# Patient Record
Sex: Male | Born: 1970 | Race: White | Hispanic: No | Marital: Single | Smoking: Never smoker
Health system: Southern US, Community
[De-identification: ages and names within clinical notes are randomized; demographics above are authoritative.]

## PROBLEM LIST (undated history)

## (undated) DIAGNOSIS — J302 Other seasonal allergic rhinitis: Secondary | ICD-10-CM

## (undated) DIAGNOSIS — T7840XA Allergy, unspecified, initial encounter: Secondary | ICD-10-CM

## (undated) DIAGNOSIS — R011 Cardiac murmur, unspecified: Secondary | ICD-10-CM

## (undated) DIAGNOSIS — M1990 Unspecified osteoarthritis, unspecified site: Secondary | ICD-10-CM

## (undated) DIAGNOSIS — Z7251 High risk heterosexual behavior: Secondary | ICD-10-CM

## (undated) DIAGNOSIS — E785 Hyperlipidemia, unspecified: Secondary | ICD-10-CM

## (undated) DIAGNOSIS — R7989 Other specified abnormal findings of blood chemistry: Secondary | ICD-10-CM

## (undated) DIAGNOSIS — E291 Testicular hypofunction: Secondary | ICD-10-CM

## (undated) DIAGNOSIS — D509 Iron deficiency anemia, unspecified: Secondary | ICD-10-CM

## (undated) DIAGNOSIS — I10 Essential (primary) hypertension: Secondary | ICD-10-CM

## (undated) DIAGNOSIS — K219 Gastro-esophageal reflux disease without esophagitis: Secondary | ICD-10-CM

## (undated) DIAGNOSIS — D3502 Benign neoplasm of left adrenal gland: Secondary | ICD-10-CM

## (undated) DIAGNOSIS — E119 Type 2 diabetes mellitus without complications: Secondary | ICD-10-CM

## (undated) DIAGNOSIS — K2960 Other gastritis without bleeding: Secondary | ICD-10-CM

## (undated) DIAGNOSIS — G510 Bell's palsy: Secondary | ICD-10-CM

## (undated) DIAGNOSIS — K222 Esophageal obstruction: Secondary | ICD-10-CM

## (undated) DIAGNOSIS — K7581 Nonalcoholic steatohepatitis (NASH): Secondary | ICD-10-CM

## (undated) HISTORY — DX: Cardiac murmur, unspecified: R01.1

## (undated) HISTORY — DX: Testicular hypofunction: E29.1

## (undated) HISTORY — DX: Unspecified osteoarthritis, unspecified site: M19.90

## (undated) HISTORY — DX: Esophageal obstruction: K22.2

## (undated) HISTORY — DX: High risk heterosexual behavior: Z72.51

## (undated) HISTORY — DX: Other seasonal allergic rhinitis: J30.2

## (undated) HISTORY — PX: TONSILLECTOMY: SUR1361

## (undated) HISTORY — DX: Allergy, unspecified, initial encounter: T78.40XA

## (undated) HISTORY — DX: Gastro-esophageal reflux disease without esophagitis: K21.9

## (undated) HISTORY — DX: Essential (primary) hypertension: I10

## (undated) HISTORY — DX: Benign neoplasm of left adrenal gland: D35.02

## (undated) HISTORY — DX: Other gastritis without bleeding: K29.60

## (undated) HISTORY — DX: Morbid (severe) obesity due to excess calories: E66.01

## (undated) HISTORY — DX: Iron deficiency anemia, unspecified: D50.9

## (undated) HISTORY — DX: Nonalcoholic steatohepatitis (NASH): K75.81

## (undated) HISTORY — DX: Other specified abnormal findings of blood chemistry: R79.89

## (undated) HISTORY — DX: Hyperlipidemia, unspecified: E78.5

---

## 1998-04-13 ENCOUNTER — Ambulatory Visit (HOSPITAL_COMMUNITY): Admission: RE | Admit: 1998-04-13 | Discharge: 1998-04-13 | Payer: Self-pay | Admitting: Internal Medicine

## 1998-09-11 HISTORY — PX: LASIK: SHX215

## 2003-11-19 ENCOUNTER — Encounter: Payer: Self-pay | Admitting: Internal Medicine

## 2004-09-21 ENCOUNTER — Ambulatory Visit: Payer: Self-pay | Admitting: Family Medicine

## 2006-02-07 ENCOUNTER — Ambulatory Visit: Payer: Self-pay | Admitting: Internal Medicine

## 2006-02-28 ENCOUNTER — Ambulatory Visit: Payer: Self-pay | Admitting: Internal Medicine

## 2006-04-19 ENCOUNTER — Encounter: Admission: RE | Admit: 2006-04-19 | Discharge: 2006-04-19 | Payer: Self-pay | Admitting: Specialist

## 2006-04-24 ENCOUNTER — Ambulatory Visit: Payer: Self-pay | Admitting: Internal Medicine

## 2007-07-09 ENCOUNTER — Ambulatory Visit: Payer: Self-pay | Admitting: Internal Medicine

## 2007-10-16 ENCOUNTER — Ambulatory Visit: Payer: Self-pay | Admitting: Internal Medicine

## 2007-10-17 ENCOUNTER — Telehealth (INDEPENDENT_AMBULATORY_CARE_PROVIDER_SITE_OTHER): Payer: Self-pay | Admitting: *Deleted

## 2008-01-29 ENCOUNTER — Ambulatory Visit: Payer: Self-pay | Admitting: Internal Medicine

## 2008-01-30 ENCOUNTER — Encounter (INDEPENDENT_AMBULATORY_CARE_PROVIDER_SITE_OTHER): Payer: Self-pay | Admitting: *Deleted

## 2008-01-30 LAB — CONVERTED CEMR LAB
Basophils Absolute: 0 10*3/uL (ref 0.0–0.1)
Eosinophils Absolute: 0.1 10*3/uL (ref 0.0–0.7)
HCT: 43.3 % (ref 39.0–52.0)
Hemoglobin: 14.4 g/dL (ref 13.0–17.0)
Hgb A1c MFr Bld: 5.5 % (ref 4.6–6.0)
Lymphocytes Relative: 30.2 % (ref 12.0–46.0)
MCHC: 33.3 g/dL (ref 30.0–36.0)
Monocytes Absolute: 0.7 10*3/uL (ref 0.1–1.0)
Neutro Abs: 4.4 10*3/uL (ref 1.4–7.7)
RDW: 13.4 % (ref 11.5–14.6)

## 2008-02-04 ENCOUNTER — Telehealth (INDEPENDENT_AMBULATORY_CARE_PROVIDER_SITE_OTHER): Payer: Self-pay | Admitting: *Deleted

## 2008-09-17 ENCOUNTER — Ambulatory Visit: Payer: Self-pay | Admitting: Internal Medicine

## 2008-09-18 ENCOUNTER — Telehealth (INDEPENDENT_AMBULATORY_CARE_PROVIDER_SITE_OTHER): Payer: Self-pay | Admitting: *Deleted

## 2008-10-28 ENCOUNTER — Ambulatory Visit: Payer: Self-pay | Admitting: Family Medicine

## 2008-11-13 ENCOUNTER — Telehealth: Payer: Self-pay | Admitting: Internal Medicine

## 2008-11-17 ENCOUNTER — Ambulatory Visit: Payer: Self-pay | Admitting: Internal Medicine

## 2008-11-20 ENCOUNTER — Telehealth (INDEPENDENT_AMBULATORY_CARE_PROVIDER_SITE_OTHER): Payer: Self-pay | Admitting: *Deleted

## 2008-11-20 ENCOUNTER — Encounter (INDEPENDENT_AMBULATORY_CARE_PROVIDER_SITE_OTHER): Payer: Self-pay | Admitting: *Deleted

## 2008-11-21 LAB — CONVERTED CEMR LAB

## 2009-01-18 ENCOUNTER — Ambulatory Visit: Payer: Self-pay | Admitting: Internal Medicine

## 2009-07-27 ENCOUNTER — Ambulatory Visit: Payer: Self-pay | Admitting: Family

## 2009-07-29 ENCOUNTER — Telehealth: Payer: Self-pay | Admitting: Internal Medicine

## 2009-08-02 ENCOUNTER — Telehealth (INDEPENDENT_AMBULATORY_CARE_PROVIDER_SITE_OTHER): Payer: Self-pay | Admitting: *Deleted

## 2009-08-03 ENCOUNTER — Ambulatory Visit: Payer: Self-pay | Admitting: Internal Medicine

## 2009-09-07 ENCOUNTER — Ambulatory Visit: Payer: Self-pay | Admitting: Internal Medicine

## 2010-10-13 ENCOUNTER — Ambulatory Visit (INDEPENDENT_AMBULATORY_CARE_PROVIDER_SITE_OTHER): Payer: 59 | Admitting: Internal Medicine

## 2010-10-13 ENCOUNTER — Encounter: Payer: Self-pay | Admitting: Internal Medicine

## 2010-10-13 ENCOUNTER — Other Ambulatory Visit: Payer: Self-pay | Admitting: Internal Medicine

## 2010-10-13 DIAGNOSIS — B379 Candidiasis, unspecified: Secondary | ICD-10-CM

## 2010-10-13 DIAGNOSIS — R030 Elevated blood-pressure reading, without diagnosis of hypertension: Secondary | ICD-10-CM

## 2010-10-14 LAB — HEMOGLOBIN A1C: Hgb A1c MFr Bld: 6.2 % (ref 4.6–6.5)

## 2010-10-19 ENCOUNTER — Telehealth: Payer: Self-pay | Admitting: Internal Medicine

## 2010-10-19 DIAGNOSIS — E119 Type 2 diabetes mellitus without complications: Secondary | ICD-10-CM | POA: Insufficient documentation

## 2010-10-19 NOTE — Assessment & Plan Note (Signed)
Summary: RASH BOTH LEGS   Vital Signs:  Patient profile:   40 year old male Weight:      358.4 pounds Temp:     98.7 degrees F oral Pulse rate:   88 / minute Resp:     14 per minute BP sitting:   142 / 90  (left arm) Cuff size:   large  Vitals Entered By: Shonna Chock CMA (October 13, 2010 2:50 PM) CC: Rash on both legs since Monday, no change in routine habits ( soaps, lotion, washing detergant, ect . . . . . .)   CC:  Rash on both legs since Monday, no change in routine habits ( soaps, lotion, washing detergant, and ect . . . . . .).  History of Present Illness:      This is a 40 year old man who presents with Rash.  The patient reports itching, redness, and increased warmth, but denies hives, welts, pustules, blisters, ulcers, weeping, oozing, and tenderness.  The rash is located on the groin.  The rash is worse with heat and worse with sweating.  The patient denies the following symptoms: fever and dysuria.  The patient denies history of recent antibiotic use, new medication, new clothing, and new topical exposure.  Rx: none.       See BP; no PMH of HTN. The patient denies lightheadedness, urinary frequency, headaches, and edema.  Associated symptoms include dyspnea.  The patient denies the following associated symptoms: chest pain and palpitations.  Adjunctive measures currently used by the patient include salt restriction.    Allergies: 1)  ! Pcn 2)  ! Sulfa  Family History: Father: health history unknown  Mother: HTN,  Siblings: none; MG aunt : DM; MG uncle: DM  Review of Systems Derm:  Denies poor wound healing. Endo:  Denies excessive hunger and excessive thirst.  Physical Exam  General:  in no acute distress; alert,appropriate and cooperative throughout examination Heart:  Normal rate and regular rhythm. S1 and S2 normal without gallop, murmur, click, rub .S4 Pulses:  R and L carotid,radial,dorsalis pedis and posterior tibial pulses are full and equal  bilaterally Skin:  Classic Candidiasis inguinally bilaterally Inguinal Nodes:  No significant adenopathy   Impression & Recommendations:  Problem # 1:  CANDIDIASIS (ICD-112.9)  Orders: Venipuncture (16109) TLB-A1C / Hgb A1C (Glycohemoglobin) (83036-A1C)  Problem # 2:  ELEVATED BLOOD PRESSURE WITHOUT DIAGNOSIS OF HYPERTENSION (ICD-796.2)  Complete Medication List: 1)  Azithromycin 250 Mg Tabs (Azithromycin) .... As per pack  Patient Instructions: 1)  Use Nizoral two times a day to rash  2)  Check your Blood Pressure regularly. If it is above:  135/85 ON AVERAGE you should make an appointment.   Orders Added: 1)  Est. Patient Level III [60454] 2)  Venipuncture [36415] 3)  TLB-A1C / Hgb A1C (Glycohemoglobin) [83036-A1C]  Appended Document: RASH BOTH LEGS

## 2010-10-27 NOTE — Progress Notes (Signed)
Summary: Results/denied referral  Phone Note Call from Patient Call back at Work Phone [phone]   Summary of Call: Patient left message on triage that he did not understand his results. I called the patient reviewed the results and scheduled appt for repeat A1c in 3 months. Patient denies nutritionist referral at this time, but will call back for this referral if needed. Lucious Groves CMA  October 19, 2010 8:59 AM

## 2010-10-27 NOTE — Progress Notes (Signed)
Summary: Nutrionist  Phone Note Call from Patient Call back at Work Phone [phone]   Caller: Patient Summary of Call: pt has decided he would like to see a nutrionist. Initial call taken by: Lavell Islam,  October 19, 2010 9:35 AM  Follow-up for Phone Call        Dr.Carizma Dunsworth please advise, no DX of DM, patient not taking any meds for elevated bloodsugar. ? what diagnosis to place with referral  Follow-up by: Shonna Chock CMA,  October 19, 2010 10:54 AM  New Problems: DIABETES MELLITUS, TYPE II (ICD-250.00)   New Problems: DIABETES MELLITUS, TYPE II (ICD-250.00)

## 2010-11-11 ENCOUNTER — Encounter: Payer: 59 | Attending: Internal Medicine | Admitting: *Deleted

## 2010-11-11 ENCOUNTER — Encounter: Payer: Self-pay | Admitting: Internal Medicine

## 2010-11-11 DIAGNOSIS — Z713 Dietary counseling and surveillance: Secondary | ICD-10-CM | POA: Insufficient documentation

## 2010-11-11 DIAGNOSIS — E119 Type 2 diabetes mellitus without complications: Secondary | ICD-10-CM | POA: Insufficient documentation

## 2010-11-14 ENCOUNTER — Encounter: Payer: Self-pay | Admitting: Internal Medicine

## 2010-11-14 ENCOUNTER — Other Ambulatory Visit: Payer: Self-pay | Admitting: Internal Medicine

## 2010-11-14 ENCOUNTER — Encounter (INDEPENDENT_AMBULATORY_CARE_PROVIDER_SITE_OTHER): Payer: 59 | Admitting: Internal Medicine

## 2010-11-14 DIAGNOSIS — Z0000 Encounter for general adult medical examination without abnormal findings: Secondary | ICD-10-CM

## 2010-11-14 DIAGNOSIS — R030 Elevated blood-pressure reading, without diagnosis of hypertension: Secondary | ICD-10-CM

## 2010-11-14 DIAGNOSIS — J309 Allergic rhinitis, unspecified: Secondary | ICD-10-CM | POA: Insufficient documentation

## 2010-11-14 DIAGNOSIS — E785 Hyperlipidemia, unspecified: Secondary | ICD-10-CM | POA: Insufficient documentation

## 2010-11-14 DIAGNOSIS — E119 Type 2 diabetes mellitus without complications: Secondary | ICD-10-CM

## 2010-11-15 LAB — CBC WITH DIFFERENTIAL/PLATELET
Basophils Absolute: 0 10*3/uL (ref 0.0–0.1)
Eosinophils Absolute: 0.1 10*3/uL (ref 0.0–0.7)
Lymphocytes Relative: 36.7 % (ref 12.0–46.0)
MCHC: 33.7 g/dL (ref 30.0–36.0)
MCV: 85 fl (ref 78.0–100.0)
Monocytes Absolute: 0.5 10*3/uL (ref 0.1–1.0)
Neutro Abs: 4.1 10*3/uL (ref 1.4–7.7)
Neutrophils Relative %: 54.6 % (ref 43.0–77.0)
RDW: 15.9 % — ABNORMAL HIGH (ref 11.5–14.6)

## 2010-11-15 LAB — BASIC METABOLIC PANEL
CO2: 28 mEq/L (ref 19–32)
Calcium: 9.4 mg/dL (ref 8.4–10.5)
Creatinine, Ser: 1 mg/dL (ref 0.4–1.5)
Glucose, Bld: 84 mg/dL (ref 70–99)

## 2010-11-15 LAB — LIPID PANEL
Cholesterol: 139 mg/dL (ref 0–200)
HDL: 28.3 mg/dL — ABNORMAL LOW (ref >39.00)
Triglycerides: 128 mg/dL (ref 0.0–149.0)

## 2010-11-15 LAB — HEPATIC FUNCTION PANEL
Albumin: 4.5 g/dL (ref 3.5–5.2)
Alkaline Phosphatase: 73 U/L (ref 39–117)

## 2010-11-22 NOTE — Assessment & Plan Note (Signed)
Summary: cpx/sph/ph   Vital Signs:  Patient profile:   40 year old male Height:      71.25 inches Weight:      337 pounds BMI:     46.84 Temp:     98.5 degrees F oral Pulse rate:   76 / minute Resp:     14 per minute BP sitting:   124 / 86  (left arm) Cuff size:   large  Vitals Entered By: Dwayne Lewis CMA (November 14, 2010 2:18 PM)  Comments Refused TDaP vaccine   History of Present Illness:    Dwayne Lewis is here for a physical ; he has  lost 42 # with nutrition chamges & exercise.  Preventive Screening-Counseling & Management  Alcohol-Tobacco     Smoking Status: quit  Caffeine-Diet-Exercise     Does Patient Exercise: yes  Current Medications (verified): 1)  None  Allergies: 1)  ! Pcn 2)  ! Sulfa  Past History:  Past Medical History: Allergic rhinitis ; RAD wih RTIs only  Hyperlipidemia: NMR Lipoprofile 2007: LDL 107( 1321/775), HDL 27, TG 105. LDL goal = < 110. Diabetes mellitus, type II; A1c 6.2% in 10/2010  Past Surgical History: Tonsillectomy Fractures foot & finger ; Lasik bilaterally  Family History: Father: health history unknown  Mother: HTN,  Siblings: none; MG aunt : DM; MG uncle: DM; MG uncle: DM; MGF: MI > 25  Social History: Occupation:Deposit Geophysical data processor Single Former Smoker: quit age 36 Alcohol use-no Regular exercise-yes: walking 30-45 min/ day Does Patient Exercise:  yes  Review of Systems  The patient denies anorexia, fever, vision loss, decreased hearing, hoarseness, chest pain, syncope, dyspnea on exertion, peripheral edema, prolonged cough, headaches, hemoptysis, abdominal pain, melena, hematochezia, severe indigestion/heartburn, hematuria, suspicious skin lesions, depression, abnormal bleeding, enlarged lymph nodes, and angioedema.    Physical Exam  General:  alert,appropriate and cooperative throughout examination Head:  Normocephalic and atraumatic without obvious abnormalities. No apparent alopecia Eyes:  No  corneal or conjunctival inflammation noted. Perrla. Funduscopic exam benign, without hemorrhages, exudates or papilledema.  Ears:  External ear exam shows no significant lesions or deformities.  Otoscopic examination reveals clear canals, tympanic membranes are intact bilaterally without bulging, retraction, inflammation or discharge. Hearing is grossly normal bilaterally. Nose:  External nasal examination shows no deformity or inflammation. Nasal mucosa are pink and moist without lesions or exudates. Mouth:  Oral mucosa and oropharynx without lesions or exudates.  Teeth in good repair. Neck:  No deformities, masses, or tenderness noted. Lungs:  Normal respiratory effort, chest expands symmetrically. Lungs are clear to auscultation, no crackles or wheezes. Heart:  Normal rate and regular rhythm. S1 and S2 normal without gallop, murmur, click, rub or other extra sounds. Abdomen:  Bowel sounds positive,abdomen soft and non-tender without masses, organomegaly or hernias noted. Rectal:  No external abnormalities noted. Normal sphincter tone. No rectal masses or tenderness. Genitalia:  Testes bilaterally descended without nodularity, tenderness or masses. L epididymal granuloma. No penis lesions or urethral discharge. Prostate:  Prostate gland firm and smooth, no enlargement, nodularity, tenderness, mass, asymmetry or induration. Msk:  No deformity or scoliosis noted of thoracic or lumbar spine.   Pulses:  R and L carotid,radial,dorsalis pedis and posterior tibial pulses are full and equal bilaterally Extremities:  No clubbing, cyanosis, edema, or deformity noted with normal full range of motion of all joints.   Neurologic:  alert & oriented X3 and DTRs symmetrical and normal.   Skin:  Intact without suspicious lesions or rashes  Cervical Nodes:  No lymphadenopathy noted Axillary Nodes:  No palpable lymphadenopathy Inguinal Nodes:  No significant adenopathy Psych:  memory intact for recent and remote,  normally interactive, and good eye contact.     Impression & Recommendations:  Problem # 1:  ROUTINE GENERAL MEDICAL EXAM@[email or web address]  CARE FACL (ICD-V70.0)  Orders: EKG w/ Interpretation (93000) Venipuncture (82956) TLB-Lipid Panel (80061-LIPID) TLB-BMP (Basic Metabolic Panel-BMET) (80048-METABOL) TLB-CBC Platelet - w/Differential (85025-CBCD) TLB-Hepatic/Liver Function Pnl (80076-HEPATIC) TLB-TSH (Thyroid Stimulating Hormone) (84443-TSH)  Problem # 2:  DIABETES MELLITUS, TYPE II (ICD-250.00)  Problem # 3:  HYPERLIPIDEMIA (ICD-272.4)  Problem # 4:  ELEVATED BLOOD PRESSURE WITHOUT DIAGNOSIS OF HYPERTENSION (ICD-796.2) PMH of  Patient Instructions: 1)  It is important that your Diabetic A1c level is checked every 4-6  months. 2)  See your eye doctor yearly to check for diabetic eye damage. 3)  Check your feet each night for sore areas, calluses or signs of infection. 4)  Check your Blood Pressure regularly. If it is above:135/85 ON AVERAGE  you should make an appointment.   Orders Added: 1)  Est. Patient 40-64 years [99396] 2)  EKG w/ Interpretation [93000] 3)  Venipuncture [36415] 4)  TLB-Lipid Panel [80061-LIPID] 5)  TLB-BMP (Basic Metabolic Panel-BMET) [80048-METABOL] 6)  TLB-CBC Platelet - w/Differential [85025-CBCD] 7)  TLB-Hepatic/Liver Function Pnl [80076-HEPATIC] 8)  TLB-TSH (Thyroid Stimulating Hormone) [21308-MVH]     Appended Document: cpx/sph/ph

## 2010-11-29 NOTE — Letter (Signed)
Summary: [facility]  [facility]   Imported By: Kassie Mends 11/24/2010 10:11:24  _____________________________________________________________________  External Attachment:    Type:   Image     Comment:   External Document

## 2011-01-11 ENCOUNTER — Encounter: Payer: Self-pay | Admitting: Family Medicine

## 2011-01-12 ENCOUNTER — Encounter: Payer: Self-pay | Admitting: Family Medicine

## 2011-01-12 ENCOUNTER — Ambulatory Visit (INDEPENDENT_AMBULATORY_CARE_PROVIDER_SITE_OTHER): Payer: 59 | Admitting: Family Medicine

## 2011-01-12 VITALS — BP 124/76 | HR 84 | Wt 337.0 lb

## 2011-01-12 DIAGNOSIS — M25559 Pain in unspecified hip: Secondary | ICD-10-CM

## 2011-01-12 MED ORDER — MELOXICAM 15 MG PO TABS: 15.0000 mg | ORAL_TABLET | Freq: Every day | ORAL | Status: AC

## 2011-01-12 MED ORDER — CYCLOBENZAPRINE HCL 10 MG PO TABS: 10.0000 mg | ORAL_TABLET | Freq: Three times a day (TID) | ORAL | Status: AC | PRN
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
## 2011-01-12 NOTE — Patient Instructions (Signed)
Hip Pain The hips join the upper legs to the lower pelvis. The bones, cartilage, tendons, and muscles of the hip joint perform a lot of work each day holding your body weight and allowing you to move around. Hip pain is a common symptom. It can range from a minor ache to severe pain on 1 or both hips. Pain may be felt on the inside of the hip joint near the groin, or the outside near the buttocks and upper thigh. There may be swelling or stiffness as well. It occurs more often when a person walks or performs activity. There are many reasons hip pain can develop. CAUSES It is important to work with your caregiver to identify the cause since many conditions can impact the bones, cartilage, muscles, and tendons of the hips. Causes for hip pain include:  Broken (fractured) bones.   Separation of the thighbone from the hip socket (dislocation).   Torn cartilage of the hip joint.   Swelling (inflammation) of a tendon (tendonitis), the sac within the hip joint (bursitis), or a joint.   A weakening in the abdominal wall (hernia), affecting the nerves to the hip.   Arthritis in the hip joint or lining of the hip joint.   Pinched nerves in the back, hip, or upper thigh.   A bulging disc in the spine (herniated disc).   Rarely, bone infection or cancer.  DIAGNOSIS The location of your hip pain will help your caregiver understand what may be causing the pain. A diagnosis is based on your medical history, your symptoms, results from your physical exam, and results from diagnostic tests. Diagnostic tests may include X-ray exams, a computerized magnetic scan (magnetic resonance imaging, MRI), or bone scan. TREATMENT Treatment will depend on the cause of your hip pain. Treatment may include:  Limiting activities and resting until symptoms improve.   Crutches or other walking supports (a cane or brace).   Ice, elevation, and compression.   Physical therapy or home exercises.   Shoe inserts or  special shoes.   Losing weight.   Medications to reduce pain.   Undergoing surgery.  HOME CARE INSTRUCTIONS  Only take over-the-counter or prescription medicines for pain, discomfort, or fever as directed by your caregiver.   Put ice on the injured area:   Put ice in a plastic bag.   Place a towel between your skin and the bag.   Leave the ice on for 15-20 minutes at a time, 2-3  times a day.   Keep your leg raised (elevated) when possible to lessen swelling.   Avoid activities that cause pain.   Follow specific exercises as directed by your caregiver.   Sleep with a pillow between your legs on your most comfortable side.   Record how often you have hip pain, the location of the pain, and what it feels like. This information may be helpful to you and your caregiver.   Ask your caregiver about returning to work or sports and whether you should drive.   Follow up with your caregiver for further exams, therapy, or testing as directed.  SEEK MEDICAL CARE IF:  Pain or swelling continues or worsens beyond 1 week.   You have an oral temperature above 100.4.   You are feeling unwell or have chills.   You are having an increasingly difficult time with walking.   You have a loss of sensation or other new symptoms.   You have questions or concerns.  SEEK IMMEDIATE MEDICAL CARE IF:  You cannot put weight on the affected hip.   You have fallen.   You have a sudden increase in pain and swelling in your hip.   You have an oral temperature above 100.4, not controlled by medicine.  MAKE SURE YOU:  Understand these instructions.   Will watch your condition.   Will get help right away if you are not doing well or get worse.  Document Released: 02/15/2010  [facility] Patient Information 2011 [place], [place].

## 2011-01-13 ENCOUNTER — Ambulatory Visit (INDEPENDENT_AMBULATORY_CARE_PROVIDER_SITE_OTHER)
Admission: RE | Admit: 2011-01-13 | Discharge: 2011-01-13 | Disposition: A | Discharge status: Home / Self Care | Payer: 59 | Source: Ambulatory Visit | Attending: Family Medicine | Admitting: Family Medicine

## 2011-01-13 DIAGNOSIS — M25559 Pain in unspecified hip: Secondary | ICD-10-CM

## 2011-01-17 ENCOUNTER — Other Ambulatory Visit (INDEPENDENT_AMBULATORY_CARE_PROVIDER_SITE_OTHER): Payer: 59

## 2011-01-17 DIAGNOSIS — E119 Type 2 diabetes mellitus without complications: Secondary | ICD-10-CM

## 2011-01-20 ENCOUNTER — Telehealth: Payer: Self-pay | Admitting: Internal Medicine

## 2011-01-20 NOTE — Telephone Encounter (Signed)
Spoke w/ pt informed that results are back and haven't been reviewed but informed that A1C has improved.

## 2011-01-20 NOTE — Telephone Encounter (Signed)
Patient says he had blood work on Tues 5/15---are results back??   Please call him with results

## 2011-02-13 ENCOUNTER — Ambulatory Visit: Payer: 59 | Admitting: *Deleted

## 2011-02-15 ENCOUNTER — Encounter: Payer: 59 | Attending: Internal Medicine | Admitting: *Deleted

## 2011-02-15 DIAGNOSIS — Z713 Dietary counseling and surveillance: Secondary | ICD-10-CM | POA: Insufficient documentation

## 2011-02-15 DIAGNOSIS — E119 Type 2 diabetes mellitus without complications: Secondary | ICD-10-CM | POA: Insufficient documentation

## 2011-10-09 ENCOUNTER — Encounter: Payer: Self-pay | Admitting: Family Medicine

## 2011-10-09 ENCOUNTER — Ambulatory Visit (INDEPENDENT_AMBULATORY_CARE_PROVIDER_SITE_OTHER): Payer: 59 | Admitting: Family Medicine

## 2011-10-09 VITALS — BP 120/74 | HR 121 | Temp 99.1°F | Wt 334.2 lb

## 2011-10-09 DIAGNOSIS — J029 Acute pharyngitis, unspecified: Secondary | ICD-10-CM

## 2011-10-09 DIAGNOSIS — J069 Acute upper respiratory infection, unspecified: Secondary | ICD-10-CM

## 2011-10-09 MED ORDER — GUAIFENESIN-CODEINE 100-10 MG/5ML PO SYRP: ORAL_SOLUTION | ORAL | Status: DC

## 2011-10-09 MED ORDER — AMBULATORY NON FORMULARY MEDICATION: Status: DC

## 2011-10-09 NOTE — Patient Instructions (Signed)

## 2011-10-09 NOTE — Progress Notes (Signed)
Addended by: Arnette Norris on: 10/09/2011 04:09 PM   Modules accepted: Orders

## 2011-10-09 NOTE — Progress Notes (Signed)
  Subjective:     Dwayne Lewis is a 41 y.o. male who presents for evaluation of sinus pain. Symptoms include: clear rhinorrhea. Onset of symptoms was 4 days ago. Symptoms have been gradually worsening since that time. Past history is significant for no history of pneumonia or bronchitis. Patient is a non-smoker.   The following portions of the patient's history were reviewed and updated as appropriate: allergies, current medications, past family history, past medical history, past social history, past surgical history and problem list.  Review of Systems Pertinent items are noted in HPI.   Objective:    BP 120/74  Pulse 121  Temp(Src) 99.1 F (37.3 C) (Oral)  Wt 334 lb 3.2 oz (151.592 kg)  SpO2 97% General appearance: alert, cooperative, appears stated age and no distress Head: Normocephalic, without obvious abnormality, atraumatic Ears: normal TM's and external ear canals both ears Nose: clear discharge, mild congestion, sinus tenderness bilateral Throat: lips, mucosa, and tongue normal; teeth and gums normal Neck: no adenopathy, no carotid bruit, no JVD, supple, symmetrical, trachea midline and thyroid not enlarged, symmetric, no tenderness/mass/nodules Lungs: clear to auscultation bilaterally   skin --  Escoriations,  On abd and between thighs Assessment:    Acute non-bacterial sinusitis.   pruritis--change to dove for sensitive skin,  Benadryl cream Plan:    Neti pot recommended. Instructions given. Nasal steroids per medication orders. Antihistamines per medication orders. Follow up in 2 week or as needed.

## 2011-10-12 ENCOUNTER — Ambulatory Visit (INDEPENDENT_AMBULATORY_CARE_PROVIDER_SITE_OTHER): Payer: 59 | Admitting: Family Medicine

## 2011-10-12 ENCOUNTER — Telehealth: Payer: Self-pay | Admitting: Internal Medicine

## 2011-10-12 VITALS — BP 124/84 | HR 86 | Temp 99.4°F | Resp 16 | Ht 73.5 in | Wt 333.0 lb

## 2011-10-12 DIAGNOSIS — L299 Pruritus, unspecified: Secondary | ICD-10-CM

## 2011-10-12 DIAGNOSIS — B86 Scabies: Secondary | ICD-10-CM

## 2011-10-12 MED ORDER — METHYLPREDNISOLONE SODIUM SUCC 125 MG IJ SOLR
125.0000 mg | Freq: Once | INTRAMUSCULAR | Status: AC
  Administered 2011-10-12: 125 mg via INTRAMUSCULAR

## 2011-10-12 MED ORDER — PERMETHRIN 5 % EX CREA: TOPICAL_CREAM | Freq: Once | CUTANEOUS | Status: AC

## 2011-10-12 NOTE — Telephone Encounter (Signed)
Pt states that the chlortrimeton 4 mg is not helping with the itching. Pt seen on 10-09-11

## 2011-10-12 NOTE — Telephone Encounter (Signed)
Patient states that the medicine that the Doctor gave him is not working for the itching. Would like something else called in to [facility] on [address]

## 2011-10-12 NOTE — Progress Notes (Signed)
  Subjective:    Patient ID: Dwayne Lewis, male    DOB: [birth year], 41 y.o.   MRN: [id number]  HPI patient has had a long stretch of itching over the past month or so. He was told to take antihistamines (Chlor-Trimeton (any transiently helps the itching but it is not go away to stay. No nonexposure dated with scabies. He works as a Psychologist, occupational. There is a lady and her 43 year old daughter who live upstairs in his home, but they've not had any rashes to his knowledge. A fingerstick blood sugar at his workplace yesterday was 116    Review of Systems patient has a history of some other little splotches on his skin, especially on his arms. These seem to be similar to the skin cancer places that his mother has to have a has not had any rashes similar to the current acute blocks on his trunk and thighs and upper     Objective:   Physical Exam  Papular rash on arms above the elbows, abdominal wall, lower chest wall, and thighs. These are individual tiny bumps that are reddish and itchy intensely.      Assessment & Plan:  Scabies  actinic keratosis Prediabetes   Will treat the patient symptomatically with a shot of cortisone. Also he can take over-the-counter Zyrtec.

## 2011-10-12 NOTE — Telephone Encounter (Signed)
Pt states that he was advised that this was to treat the rash he had. Pt indicated that he cannot take benadryl because it make him sleepy. Please advise

## 2011-10-12 NOTE — Telephone Encounter (Signed)
Left message to call office to clarify which med he is referring to.

## 2011-10-12 NOTE — Telephone Encounter (Signed)
It was for his uri symptoms not for itching----he can switch to benadryl and take 50- mg

## 2011-10-12 NOTE — Telephone Encounter (Signed)
Generic Allegra  OTC160 mg daily as needed should be the most effective medication without significant sedation. This is a medication professional pilots use

## 2011-10-12 NOTE — Patient Instructions (Signed)
Use the prescribed cream from the neck to toes tonight and wash tomorrow morning. Change bed sheets and clothing in AM. May repeat in 2 weeks if necessary. If symptoms continue I might try an oral medication.  Over-the-counter Zyrtec can be helpful for the itching.  You have been given a shot of hydrocortisone which should help the itching so. It may transiently elevate your blood sugar so if you get your sugar tested elsewhere the next few days you should let them know.Marland Kitchen

## 2011-10-13 NOTE — Telephone Encounter (Signed)
Left message to call office

## 2011-10-18 NOTE — Telephone Encounter (Signed)
Spoke to Pt who indicated that he has since went to [facility] and they gave him a cream which has helped his symptoms

## 2011-10-25 ENCOUNTER — Ambulatory Visit (INDEPENDENT_AMBULATORY_CARE_PROVIDER_SITE_OTHER): Payer: 59 | Admitting: Family Medicine

## 2011-10-25 DIAGNOSIS — J309 Allergic rhinitis, unspecified: Secondary | ICD-10-CM

## 2011-10-25 DIAGNOSIS — J9801 Acute bronchospasm: Secondary | ICD-10-CM

## 2011-10-25 DIAGNOSIS — E119 Type 2 diabetes mellitus without complications: Secondary | ICD-10-CM

## 2011-10-25 DIAGNOSIS — J45909 Unspecified asthma, uncomplicated: Secondary | ICD-10-CM

## 2011-10-25 DIAGNOSIS — J302 Other seasonal allergic rhinitis: Secondary | ICD-10-CM

## 2011-10-25 DIAGNOSIS — H6690 Otitis media, unspecified, unspecified ear: Secondary | ICD-10-CM

## 2011-10-25 MED ORDER — AZITHROMYCIN 250 MG PO TABS: ORAL_TABLET | ORAL | Status: AC

## 2011-10-25 MED ORDER — MOMETASONE FURO-FORMOTEROL FUM 200-5 MCG/ACT IN AERO: 1.0000 | INHALATION_SPRAY | Freq: Two times a day (BID) | RESPIRATORY_TRACT | Status: DC

## 2011-10-25 NOTE — Progress Notes (Signed)
  Subjective:    Patient ID: Dwayne Lewis, male    DOB: [birth year], 41 y.o.   MRN: [id number]  Cough This is a recurrent problem. The problem has been waxing and waning. The problem occurs constantly. The cough is non-productive. Associated symptoms include nasal congestion, rhinorrhea and wheezing (early this am). Pertinent negatives include no chills or fever. Shortness of breath: with cough.   Patient states he has chronic allergies which he takes OTC zyrtec which typically controls nasal congestion. Patient has had episodic asthmatic flairs related to his allergies in the past.  He is a non smoker  PMH/ DM with elevated blood sugars recently            Review of Systems  Constitutional: Negative for fever and chills.  HENT: Positive for rhinorrhea.   Respiratory: Positive for cough and wheezing (early this am). Shortness of breath: with cough.        Objective:   Physical Exam  Constitutional: He appears well-developed and well-nourished.  HENT:  Right Ear: Tympanic membrane is erythematous. A middle ear effusion is present.  Nose: Rhinorrhea (green) present.  Mouth/Throat: Posterior oropharyngeal edema: pnd.  Pulmonary/Chest: Respiratory distress: prolong exp phase without wheezing.  Neurological: He is alert.  Skin: Skin is warm.       Assessment & Plan:   1. OM (otitis media)  azithromycin (ZITHROMAX) 250 MG tablet  2. RAD (reactive airway disease)  Mometasone Furo-Formoterol Fum 200-5 MCG/ACT AERO, given elevated BS's opt to avoid oral prednisone. Will treat reactive airways with ICS and long acting Beta 2 agonist. INB patient to call us or follow up with his PCP   3. Seasonal allergies    4. DM (diabetes mellitus)

## 2012-01-10 ENCOUNTER — Encounter: Payer: Self-pay | Admitting: Internal Medicine

## 2012-01-10 ENCOUNTER — Ambulatory Visit (HOSPITAL_BASED_OUTPATIENT_CLINIC_OR_DEPARTMENT_OTHER)
Admission: RE | Admit: 2012-01-10 | Discharge: 2012-01-10 | Disposition: A | Discharge status: Home / Self Care | Payer: 59 | Source: Ambulatory Visit | Attending: Internal Medicine | Admitting: Internal Medicine

## 2012-01-10 ENCOUNTER — Ambulatory Visit (INDEPENDENT_AMBULATORY_CARE_PROVIDER_SITE_OTHER): Payer: 59 | Admitting: Internal Medicine

## 2012-01-10 VITALS — BP 126/90 | HR 91 | Temp 98.6°F | Resp 12 | Ht 71.03 in | Wt 349.0 lb

## 2012-01-10 DIAGNOSIS — R0989 Other specified symptoms and signs involving the circulatory and respiratory systems: Secondary | ICD-10-CM

## 2012-01-10 DIAGNOSIS — R0600 Dyspnea, unspecified: Secondary | ICD-10-CM

## 2012-01-10 DIAGNOSIS — E785 Hyperlipidemia, unspecified: Secondary | ICD-10-CM

## 2012-01-10 DIAGNOSIS — R0602 Shortness of breath: Secondary | ICD-10-CM | POA: Insufficient documentation

## 2012-01-10 DIAGNOSIS — Z0000 Encounter for general adult medical examination without abnormal findings: Secondary | ICD-10-CM

## 2012-01-10 DIAGNOSIS — E119 Type 2 diabetes mellitus without complications: Secondary | ICD-10-CM | POA: Insufficient documentation

## 2012-01-10 DIAGNOSIS — R9431 Abnormal electrocardiogram [ECG] [EKG]: Secondary | ICD-10-CM | POA: Insufficient documentation

## 2012-01-10 DIAGNOSIS — I289 Disease of pulmonary vessels, unspecified: Secondary | ICD-10-CM

## 2012-01-10 LAB — HEPATIC FUNCTION PANEL
ALT: 36 U/L (ref 0–53)
Bilirubin, Direct: 0.1 mg/dL (ref 0.0–0.3)
Total Protein: 7.1 g/dL (ref 6.0–8.3)

## 2012-01-10 LAB — CBC WITH DIFFERENTIAL/PLATELET
Basophils Relative: 0.4 % (ref 0.0–3.0)
Eosinophils Relative: 0.9 % (ref 0.0–5.0)
MCHC: 32.5 g/dL (ref 30.0–36.0)
Monocytes Absolute: 0.7 10*3/uL (ref 0.1–1.0)
Neutrophils Relative %: 65.2 % (ref 43.0–77.0)
RDW: 14.5 % (ref 11.5–14.6)

## 2012-01-10 LAB — BASIC METABOLIC PANEL
BUN: 8 mg/dL (ref 6–23)
CO2: 28 mEq/L (ref 19–32)
Calcium: 8.7 mg/dL (ref 8.4–10.5)
GFR: 135.16 mL/min (ref >60.00)
Glucose, Bld: 85 mg/dL (ref 70–99)

## 2012-01-10 LAB — LIPID PANEL: VLDL: 39.8 mg/dL (ref 0.0–40.0)

## 2012-01-10 LAB — TSH: TSH: 0.88 u[IU]/mL (ref 0.35–5.50)

## 2012-01-10 NOTE — Progress Notes (Signed)
Subjective:    Patient ID: Dwayne Lewis, male    DOB: [birth year], 41 y.o.   MRN: [id number]  HPI Mr [name] is here for a physical;acute issues include dyspnea intermittently, even at rest. He is not engaged in a regular exercise program.  [name] . does help resolve he uses only one puff every 12 hours because of cost considerations.  He does have a history of reactive airways disease only with respiratory infections.   Review of Systems He denies any itchy eyes, sneezing or other extrinsic symptoms. Coworkers have told them they hear him wheezing across the room. He denies symptoms of upper respiratory tract infection such as frontal headache, facial pain or nasal purulence. He said no associated cough or sputum production or hemoptysis. There has been no paroxysmal nocturnal dyspnea or edema. He has had no chest pain, palpitations, claudication symptoms.  He's had no fever, chills, or sweats. He is gained 12 pounds since his last visit.  He denies abdominal pain, melena, rectal bleeding.  He does not have persistent polydipsia, polyphagia, or polyuria.  His mother states he does have significant snoring;there  has been no reports of apnea         Objective:   Physical Exam Gen.:  well-nourished in appearance; weight excess. Alert, appropriate and cooperative throughout exam. Head: Normocephalic without obvious abnormalities;  no alopecia  Eyes: No corneal or conjunctival inflammation noted. Pupils equal round reactive to light and accommodation. Fundal exam is benign without hemorrhages, exudate, papilledema. Extraocular motion intact. Vision grossly normal. Ears: External  ear exam reveals no significant lesions or deformities. Canals clear .TMs normal. Hearing is grossly normal bilaterally. Nose: External nasal exam reveals no deformity or inflammation. Nasal mucosa are pink and moist. No lesions or exudates noted.  Mouth: Oropharynx poorly visualized due to crowding. Teeth  in good repair. Neck: No deformities, masses, or tenderness noted. Range of motion & Thyroid  normal Lungs: Normal respiratory effort; chest expands symmetrically. Lungs are clear to auscultation without rales, or wheezes. He exhibits increased work of breathing simply walking around the room. Heart: Normal rate and rhythm. Normal S1 and S2. No gallop, click, or rub. No murmur. Abdomen: Bowel sounds normal; abdomen soft and nontender. No masses, organomegaly or hernias noted. Protuberant Genitalia/ DRE: Bilateral granulomas are noted in the epididymal areas. Varices are noted on the left.Prostate is normal without enlargement, asymmetry, nodularity, or induration.  Musculoskeletal/extremities: No deformity or scoliosis noted of  the thoracic or lumbar spine. No clubbing, cyanosis, edema, or deformity noted. Range of motion  normal .Tone & strength  normal.Joints normal. Nail health  Good. Homan's negative Vascular: Carotid, radial artery, dorsalis pedis and  posterior tibial pulses are full and equal. No bruits present. Neurologic: Alert and oriented x3. Deep tendon reflexes symmetrical and normal.          Skin: Intact without suspicious lesions or rashes. Lymph: No cervical, axillary, or inguinal lymphadenopathy present. Psych: Mood and affect are normal. Normally interactive  Assessment & Plan:  #1 comprehensive physical exam; no acute findings #2 see Problem List with Assessments & Recommendations #3 dyspnea, multifactorial. Obviously deconditioning plays a role as does his weight gain. There is probably a component of reactive airways disease as well, although I do not hear wheezing at this time. He may also have sleep apnea. Plan: see Orders

## 2012-01-10 NOTE — Patient Instructions (Addendum)
Eat a low-fat diet with lots of fruits and vegetables, up to 7-9 servings per day. Consume less than 40 (preferably ZERO) grams of sugar per day from foods & drinks with High Fructose Corn Syrup (HFCS) sugar as #1,2,3 or # 4 on label.Whole Foods, [employer] & [employer] do not carry products with HFCS. Follow a  low carb nutrition program such as [place] or [employer] Sugar [employer]  to prevent Diabetes progression . White carbohydrates (potatoes, rice, bread, and pasta) have a high spike of sugar and a high load of sugar. For example a  baked potato has a cup of sugar and a  french fry  2 teaspoons of sugar. Yams, wild  rice, whole grained bread &  wheat pasta have been much lower spike and load of  sugar. Portions should be the size of a deck of cards or your palm.  Monthly self exams as discussed.  Dulera 2 inhalations every 12 hours; gargle and spit after use . If your symptoms do not improve; I recommend coronary function tests and sleep apnea testing Order for x-rays entered into  the computer; these will be performed at [facility]. No appointment is necessary.   Please try to go on My Chart within the next 24 hours to allow me to release the results directly to you.

## 2012-01-12 ENCOUNTER — Encounter: Payer: Self-pay | Admitting: Internal Medicine

## 2012-10-26 ENCOUNTER — Other Ambulatory Visit: Payer: Self-pay

## 2013-03-12 ENCOUNTER — Encounter: Payer: Self-pay | Admitting: Internal Medicine

## 2013-03-12 ENCOUNTER — Ambulatory Visit (INDEPENDENT_AMBULATORY_CARE_PROVIDER_SITE_OTHER): Payer: 59 | Admitting: Internal Medicine

## 2013-03-12 VITALS — BP 134/88 | HR 89 | Temp 98.6°F | Resp 14 | Ht 71.5 in | Wt 352.0 lb

## 2013-03-12 DIAGNOSIS — Z0000 Encounter for general adult medical examination without abnormal findings: Secondary | ICD-10-CM

## 2013-03-12 LAB — HEPATIC FUNCTION PANEL
ALT: 53 U/L (ref 0–53)
AST: 47 U/L — ABNORMAL HIGH (ref 0–37)
Albumin: 4.2 g/dL (ref 3.5–5.2)
Alkaline Phosphatase: 79 U/L (ref 39–117)
Total Protein: 7.5 g/dL (ref 6.0–8.3)

## 2013-03-12 LAB — BASIC METABOLIC PANEL
CO2: 27 mEq/L (ref 19–32)
Calcium: 9 mg/dL (ref 8.4–10.5)
GFR: 118.23 mL/min (ref >60.00)
Sodium: 135 mEq/L (ref 135–145)

## 2013-03-12 LAB — MICROALBUMIN / CREATININE URINE RATIO
Creatinine,U: 133.9 mg/dL
Microalb, Ur: 0.6 mg/dL (ref 0.0–1.9)

## 2013-03-12 LAB — CBC WITH DIFFERENTIAL/PLATELET
Basophils Relative: 0.3 % (ref 0.0–3.0)
Hemoglobin: 13.8 g/dL (ref 13.0–17.0)
Lymphocytes Relative: 23.1 % (ref 12.0–46.0)
Monocytes Relative: 6.9 % (ref 3.0–12.0)
Neutro Abs: 7.3 10*3/uL (ref 1.4–7.7)
RBC: 4.89 Mil/uL (ref 4.22–5.81)

## 2013-03-12 LAB — HEMOGLOBIN A1C: Hgb A1c MFr Bld: 6 % (ref 4.6–6.5)

## 2013-03-12 NOTE — Patient Instructions (Addendum)
Preventive Health Care: Exercise at least 30-45 minutes a day,  3-4 days a week.  Eat a low-fat diet with lots of fruits and vegetables, up to 7-9 servings per day. This would eliminate the need for vitamin supplements. Avoid obesity; your goal is waist measurement < 40 inches.Consume less than 40 grams of sugar (preferably ZERO) per day from foods & drinks with High Fructose Corn Sugar as #1,2,3 or # 4 on label. Health Care Power of Attorney & Living Will. Complete these if not in place ; these place you in charge of your health care decisions. Minimal Blood Pressure Goal= AVERAGE < 140/90;  Ideal is an AVERAGE < 135/85. This AVERAGE should be calculated from @ least 5-7 BP readings taken @ different times of day on different days of week. You should not respond to isolated BP readings , but rather the AVERAGE for that week .Please bring your  blood pressure cuff to office visits to verify that it is reliable.It  can also be checked against the blood pressure device at the pharmacy. Finger or wrist cuffs are not dependable; an arm cuff is.

## 2013-03-12 NOTE — Progress Notes (Signed)
  Subjective:    Patient ID: Dwayne Lewis, male    DOB: 1970-10-25, 42 y.o.   MRN: [id number]  HPI  He is here for a physical;acute issues denied.      Review of Systems He is on a "diabetic type diet" ; he exercises as walking  60 minutes 5 times per week without symptoms. Specifically he denies chest pain, palpitations, dyspnea, or claudication. Family history is negative for premature coronary disease. Advanced cholesterol testing reveals his LDL goal was less than 100, ideally <80.     Objective:   Physical Exam Gen.:well-nourished in appearance. Alert, appropriate and cooperative throughout exam.Appears younger than stated age  Head: Normocephalic without obvious abnormalities; no alopecia  Eyes: No corneal or conjunctival inflammation noted. Extraocular motion intact. Vision grossly normal with lenses Ears: External  ear exam reveals no significant lesions or deformities. Canals clear .TMs normal. Hearing is grossly normal bilaterally. Nose: External nasal exam reveals no deformity or inflammation. Nasal mucosa are pink and moist. No lesions or exudates noted.   Mouth: Oral mucosa and oropharynx reveal no lesions or exudates. Teeth in good repair. Neck: No deformities, masses, or tenderness noted. Range of motion & Thyroid normal. Lungs: Normal respiratory effort; chest expands symmetrically. Lungs are clear to auscultation without rales, wheezes, or increased work of breathing. Heart: Normal rate and rhythm. Normal S1 and S2. No gallop, click, or rub. S4 w/o murmur. Abdomen: Bowel sounds normal; abdomen soft and nontender. No masses, organomegaly or hernias noted. Genitalia: Genitalia normal except for left varices & epididymal granuloma . Prostate is normal without enlargement, asymmetry, nodularity, or induration.                                  Musculoskeletal/extremities: Large boned frame.Accentuated curvature of upper thoracic  Spine. No clubbing, cyanosis, edema, or  significant extremity  deformity noted. Range of motion normal .Tone & strength  Normal. Joints normal . Nail health good. Able to lie down & sit up w/o help. Negative SLR bilaterally Vascular: Carotid, radial artery, dorsalis pedis and  posterior tibial pulses are full and equal. No bruits present. Neurologic: Alert and oriented x3. Deep tendon reflexes symmetrical and normal.        Skin: Intact without suspicious lesions or rashes. Lymph: No cervical, axillary, or inguinal lymphadenopathy present. Psych: Mood and affect are normal. Normally interactive                                                                                         Assessment & Plan:  #1 comprehensive physical exam; no acute findings  Plan: see Orders  & Recommendations

## 2013-07-17 ENCOUNTER — Other Ambulatory Visit: Payer: Self-pay

## 2013-09-01 ENCOUNTER — Ambulatory Visit (INDEPENDENT_AMBULATORY_CARE_PROVIDER_SITE_OTHER): Payer: 59 | Admitting: Family

## 2013-09-01 ENCOUNTER — Encounter: Payer: Self-pay | Admitting: Family

## 2013-09-01 VITALS — BP 140/80 | HR 117 | Temp 100.2°F | Resp 16 | Ht 71.5 in | Wt 362.1 lb

## 2013-09-01 DIAGNOSIS — R509 Fever, unspecified: Secondary | ICD-10-CM

## 2013-09-01 DIAGNOSIS — H6691 Otitis media, unspecified, right ear: Secondary | ICD-10-CM

## 2013-09-01 DIAGNOSIS — H6690 Otitis media, unspecified, unspecified ear: Secondary | ICD-10-CM

## 2013-09-01 MED ORDER — CEFDINIR 300 MG PO CAPS: 300.0000 mg | ORAL_CAPSULE | Freq: Two times a day (BID) | ORAL | Status: DC

## 2013-09-01 NOTE — Patient Instructions (Signed)
Please call if symptoms worsen or if not improved in 2-3 days. You may alternate tylenol and motrin every 6 hours as needed for pain/fever.

## 2013-09-01 NOTE — Assessment & Plan Note (Addendum)
Rapid flu swab negative. Will rx with cefdinir.

## 2013-09-01 NOTE — Progress Notes (Signed)
   Subjective:    Patient ID: Dwayne Lewis, male    DOB: 1971-05-05, 42 y.o.   MRN: [id number]  HPI  Dwayne Lewis is a 42 yr old male who presents today with chief complaint of body aches. Symptoms started this AM with sore throat which has now resolved. Now he has cold chills. "feels like I have been hit by a Mack truck." Took tylenol at [facility].  Reports Tmax 101.6. Denies dysuria. Denies N/V.  Reports mild diarrhea.     Review of Systems See HPI  Past Medical History  Diagnosis Date  . Seasonal allergies     RAD with RTIs only  . Hyperlipidemia     75), HDL 27, TG 105. LDL goal= <110  . Other abnormal glucose 2012     A1c 6.2%     History   Social History  . Marital Status: Single    Spouse Name: N/A    Number of Children: N/A  . Years of Education: N/A   Occupational History  . Not on file.   Social History Main Topics  . Smoking status: Never Smoker   . Smokeless tobacco: Never Used  . Alcohol Use: No  . Drug Use: No  . Sexual Activity: Not on file   Other Topics Concern  . Not on file   Social History Narrative  . No narrative on file    Past Surgical History  Procedure Laterality Date  . Tonsillectomy    . Lasik  2000    Bilaterally    Family History  Problem Relation Age of Onset  . Hypertension Mother   . Transient ischemic attack Maternal Grandfather   . Lung cancer Maternal Grandmother   . COPD Maternal Grandmother   . COPD      MG uncle  . Diabetes      MG uncle  . Diabetes      MG aunt    Allergies  Allergen Reactions  . Penicillins     Rash   . Sulfonamide Derivatives     REACTION: rash     No current outpatient prescriptions on file prior to visit.   No current facility-administered medications on file prior to visit.    BP 140/80  Pulse 117  Temp(Src) 100.2 F (37.9 C) (Oral)  Resp 16  Ht 5' 11.5" (1.816 m)  Wt 362 lb 1.9 oz (164.257 kg)  BMI 49.81 kg/m2  SpO2 98%       Objective:   Physical Exam    Constitutional: He is oriented to person, place, and time. He appears well-developed and well-nourished. No distress.  HENT:  Head: Normocephalic and atraumatic.  Right Ear: Ear canal normal. Tympanic membrane is erythematous.  Left Ear: Tympanic membrane and ear canal normal.  Difficult oropharyngeal exam due to fullness of tongue.  Unable to visualize tonsils.    Neurological: He is alert and oriented to person, place, and time.  Skin: Skin is warm and dry.  Psychiatric: He has a normal mood and affect. His behavior is normal. Judgment and thought content normal.          Assessment & Plan:

## 2013-12-16 ENCOUNTER — Encounter: Payer: Self-pay | Admitting: Family Medicine

## 2013-12-16 ENCOUNTER — Ambulatory Visit (INDEPENDENT_AMBULATORY_CARE_PROVIDER_SITE_OTHER): Payer: 59 | Admitting: Family Medicine

## 2013-12-16 VITALS — BP 153/84 | HR 115 | Temp 98.7°F | Ht 71.5 in | Wt 374.0 lb

## 2013-12-16 DIAGNOSIS — B9789 Other viral agents as the cause of diseases classified elsewhere: Secondary | ICD-10-CM

## 2013-12-16 DIAGNOSIS — B349 Viral infection, unspecified: Secondary | ICD-10-CM | POA: Insufficient documentation

## 2013-12-16 MED ORDER — HYDROCODONE-HOMATROPINE 5-1.5 MG/5ML PO SYRP: ORAL_SOLUTION | ORAL | Status: DC

## 2013-12-16 NOTE — Progress Notes (Signed)
Pre visit review using our clinic review tool, if applicable. No additional management support is needed unless otherwise documented below in the visit note. 

## 2013-12-16 NOTE — Progress Notes (Signed)
OFFICE NOTE  12/16/2013  CC:  Chief Complaint  Patient presents with  . Cough     HPI: Patient is a 43 y.o. Caucasian male who is here for cough. About 3d of dry cough, slight hoarseness, hurting on right side of neck but no ST, has HA, body aches this morning but this has now resolved.  No fever.  Has some hot/cold flashes during which temp checks are normal.  Ibuprofen tried helped a little.  Claritin no help.  Pertinent PMH:  Past medical, surgical reviewed. MEDS:  As per HPI  PE: Blood pressure 153/84, pulse 115, temperature 98.7 F (37.1 C), temperature source Temporal, height 5' 11.5" (1.816 m), weight 374 lb (169.645 kg), SpO2 96.00%. VS: noted--normal. Gen: alert, NAD, NONTOXIC APPEARING. HEENT: eyes without injection, drainage, or swelling.  Ears: EACs clear, TMs with normal light reflex and landmarks.  Nose: Clear rhinorrhea, with some dried, crusty exudate adherent to mildly injected mucosa.  No purulent d/c.  No paranasal sinus TTP.  No facial swelling.  Throat and mouth without focal lesion.  No pharyngial swelling, erythema, or exudate.   Neck: supple, no LAD.   LUNGS: CTA bilat, nonlabored resps.   CV: RRR, no m/r/g. EXT: no c/c/e SKIN: no rash    IMPRESSION AND PLAN:  Viral URI/bronchitis syndrome. Self-limited nature of this illness was discussed, questions answered.  Discussed symptomatic care, rest, fluids.  Hycodan susp, 1-2 tsp q6h prn, #233ml, no RF. Warning signs/symptoms of worsening illness were discussed.  Patient instructed to call or return if any of these occur.  An After Visit Summary was printed and given to the patient.  FOLLOW UP: prn

## 2014-01-01 ENCOUNTER — Encounter: Payer: Self-pay | Admitting: Internal Medicine

## 2014-01-01 ENCOUNTER — Ambulatory Visit (INDEPENDENT_AMBULATORY_CARE_PROVIDER_SITE_OTHER): Payer: 59 | Admitting: Internal Medicine

## 2014-01-01 ENCOUNTER — Other Ambulatory Visit (INDEPENDENT_AMBULATORY_CARE_PROVIDER_SITE_OTHER): Payer: 59

## 2014-01-01 VITALS — BP 128/90 | HR 88 | Temp 99.1°F | Resp 15 | Wt 380.0 lb

## 2014-01-01 DIAGNOSIS — R358 Other polyuria: Secondary | ICD-10-CM

## 2014-01-01 DIAGNOSIS — H1045 Other chronic allergic conjunctivitis: Secondary | ICD-10-CM

## 2014-01-01 DIAGNOSIS — R631 Polydipsia: Secondary | ICD-10-CM

## 2014-01-01 DIAGNOSIS — R3589 Other polyuria: Secondary | ICD-10-CM

## 2014-01-01 DIAGNOSIS — J209 Acute bronchitis, unspecified: Secondary | ICD-10-CM

## 2014-01-01 DIAGNOSIS — R635 Abnormal weight gain: Secondary | ICD-10-CM

## 2014-01-01 DIAGNOSIS — H1010 Acute atopic conjunctivitis, unspecified eye: Secondary | ICD-10-CM

## 2014-01-01 LAB — TSH: TSH: 1.78 u[IU]/mL (ref 0.35–5.50)

## 2014-01-01 LAB — HEMOGLOBIN A1C: Hgb A1c MFr Bld: 6.2 % (ref 4.6–6.5)

## 2014-01-01 MED ORDER — AZITHROMYCIN 250 MG PO TABS: ORAL_TABLET | ORAL | Status: DC

## 2014-01-01 MED ORDER — MONTELUKAST SODIUM 10 MG PO TABS: 10.0000 mg | ORAL_TABLET | Freq: Every day | ORAL | Status: DC

## 2014-01-01 MED ORDER — FLUTICASONE-SALMETEROL 250-50 MCG/DOSE IN AEPB: 1.0000 | INHALATION_SPRAY | Freq: Two times a day (BID) | RESPIRATORY_TRACT | Status: DC

## 2014-01-01 NOTE — Patient Instructions (Addendum)
Plain Mucinex (NOT D) for thick secretions ;force NON dairy fluids .   Nasal cleansing in the shower as discussed with lather of mild shampoo.After 10 seconds wash off lather while  exhaling through nostrils. Make sure that all residual soap is removed to prevent irritation.  Flonase OR Nasacort AQ 1 spray in each nostril twice a day as needed. Use the "crossover" technique into opposite nostril spraying toward opposite ear @ 45 degree angle, not straight up into nostril.  Use a Neti pot daily only  as needed for significant sinus congestion; going from open side to congested side . Plain Allegra (NOT D )  160 daily , Loratidine 10 mg , OR Zyrtec 10 mg @ bedtime  as needed for itchy eyes & sneezing. Carry room temperature water and sip liberally after coughing.  Advair sample one  inhalation every 12 hours; gargle and spit after use

## 2014-01-01 NOTE — Progress Notes (Signed)
Pre visit review using our clinic review tool, if applicable. No additional management support is needed unless otherwise documented below in the visit note. 

## 2014-01-01 NOTE — Progress Notes (Signed)
   Subjective:    Patient ID: Dwayne Lewis, male    DOB: Aug 16, 1971, 43 y.o.   MRN: [id number]  HPI   Symptoms began approximately 3 weeks ago as myalgias and arthralgias. He was seen in 12/16/13 and diagnosed with a viral syndrome. Influenza A and B. smears were negative.  He did improve until the middle of last week when he developed a cough. This is dry and paroxysmal. It can last up to 1-2 hours, particularly at night  He also has itchy, watery eyes. He has sneezing and wheezing.  The narcotic cough syrup has helped but he can only tolerate a half a teaspoon at a time.  He has tried Zyrtec, Human resources officer, and Claritin in the past with suboptimal response. Allergy shots not beneficial either.  He has never smoked.  Review of Systems  He specifically denies fever, chills, sweats, pleuritic chest pain, nasal purulence, purulent sputum, facial pain or frontal sinus pain, or dyspnea.  Reflux not a significant issue. He is on Prilosec twice a day.  15 pound weight gain in the last 3 months  He does describe polyuria, or polydipsia and occasional polyphagia. His last A1c was 6% in July 2014..     Objective:   Physical Exam General appearance:obese ; adequately nourished; no acute distress or increased work of breathing is present.  No  lymphadenopathy about the head, neck, or axilla noted.   Eyes: No conjunctival inflammation or lid edema is present. There is no scleral icterus.  Ears:  External ear exam shows no significant lesions or deformities.  Otoscopic examination reveals clear canals, tympanic membranes are intact bilaterally without bulging, retraction, inflammation or discharge.  Nose:  External nasal examination shows no deformity or inflammation. Nasal mucosa are pink and moist without lesions or exudates. No septal dislocation or deviation.No obstruction to airflow.   Oral exam: Dental hygiene is good; lips and gums are healthy appearing.There is no oropharyngeal erythema or  exudate noted.   Neck:  No deformities, thyromegaly, masses, or tenderness noted.   Supple with full range of motion without pain.   Heart:  Normal rate and regular rhythm. S1 and S2 normal without gallop, murmur, click, rub or other extra sounds.   Lungs:Chest clear to auscultation; no wheezes, rhonchi,rales ,or rubs present.No increased work of breathing;but racking paroxysmal cough with deep inspiration .    Extremities:  No cyanosis, edema, or clubbing  noted    Skin: Warm & dry w/o jaundice or tenting.  Increased curvature of upper thoracic spine  Deep tendon reflexes equal and within normal limits         Assessment & Plan:  #1 acute bronchitis w/o bronchospasm #2 allergic rhinoconjunctivitis  #3 weight gain  #4 polyuria, polydipsia, and polyphagia Plan: See orders and recommendations

## 2014-01-02 DIAGNOSIS — Z6841 Body Mass Index (BMI) 40.0 and over, adult: Secondary | ICD-10-CM

## 2014-03-16 ENCOUNTER — Encounter: Payer: 59 | Admitting: Family Medicine

## 2014-03-26 ENCOUNTER — Encounter: Payer: Self-pay | Admitting: Internal Medicine

## 2014-03-26 ENCOUNTER — Ambulatory Visit (INDEPENDENT_AMBULATORY_CARE_PROVIDER_SITE_OTHER): Payer: 59 | Admitting: Internal Medicine

## 2014-03-26 VITALS — BP 136/92 | HR 87 | Temp 98.3°F | Ht 71.8 in | Wt 342.0 lb

## 2014-03-26 DIAGNOSIS — Z0000 Encounter for general adult medical examination without abnormal findings: Secondary | ICD-10-CM

## 2014-03-26 DIAGNOSIS — R7301 Impaired fasting glucose: Secondary | ICD-10-CM

## 2014-03-26 DIAGNOSIS — Z125 Encounter for screening for malignant neoplasm of prostate: Secondary | ICD-10-CM | POA: Insufficient documentation

## 2014-03-26 LAB — LIPID PANEL
CHOL/HDL RATIO: 4
CHOLESTEROL: 118 mg/dL (ref 0–200)
HDL: 27.4 mg/dL — ABNORMAL LOW (ref >39.00)
LDL CALC: 70 mg/dL (ref 0–99)
NonHDL: 90.6
Triglycerides: 104 mg/dL (ref 0.0–149.0)
VLDL: 20.8 mg/dL (ref 0.0–40.0)

## 2014-03-26 LAB — COMPREHENSIVE METABOLIC PANEL
ALBUMIN: 4.3 g/dL (ref 3.5–5.2)
ALK PHOS: 75 U/L (ref 39–117)
ALT: 128 U/L — ABNORMAL HIGH (ref 0–53)
AST: 86 U/L — ABNORMAL HIGH (ref 0–37)
BUN: 9 mg/dL (ref 6–23)
CO2: 25 mEq/L (ref 19–32)
Calcium: 9.2 mg/dL (ref 8.4–10.5)
Chloride: 104 mEq/L (ref 96–112)
Creatinine, Ser: 0.8 mg/dL (ref 0.4–1.5)
GFR: 110.91 mL/min (ref >60.00)
Glucose, Bld: 91 mg/dL (ref 70–99)
POTASSIUM: 3.8 meq/L (ref 3.5–5.1)
SODIUM: 138 meq/L (ref 135–145)
TOTAL PROTEIN: 7.5 g/dL (ref 6.0–8.3)
Total Bilirubin: 1.2 mg/dL (ref 0.2–1.2)

## 2014-03-26 LAB — HEMOGLOBIN A1C: HEMOGLOBIN A1C: 5.8 % (ref 4.6–6.5)

## 2014-03-26 NOTE — Assessment & Plan Note (Signed)
Td -- declined Never had a cscope Doing great with diet and exercise, has lost more than 15 pounds in the last month, following a one year long program, more active, feels great Labs BP slightly elevated, recommend ambulatory BPs - followup 6 months

## 2014-03-26 NOTE — Progress Notes (Signed)
   Subjective:    Patient ID: Dwayne Lewis, male    DOB: 21-Jun-1971, 43 y.o.   MRN: [id number]  DOS:  03/26/2014 Type of visit - description: CPX History:  Feeling very well, diet and exercise have definitely improved in the last month   ROS Denies chest pain or difficulty breathing No  nausea, vomiting, diarrhea No cough, sputum production or chest congestion. No anxiety- depression No dysuria gross hematuria  Past Medical History  Diagnosis Date  . Seasonal allergies     RAD with RTIs only  . Hyperlipidemia     75), HDL 27, TG 105. LDL goal= <110  . Other abnormal glucose 2012     A1c 6.2%     Past Surgical History  Procedure Laterality Date  . Tonsillectomy    . Lasik  2000    Bilaterally    History   Social History  . Marital Status: Single    Spouse Name: N/A    Number of Children: 0  . Years of Education: N/A   Occupational History  . [employer]    Social History Main Topics  . Smoking status: Never Smoker   . Smokeless tobacco: Never Used  . Alcohol Use: No  . Drug Use: No  . Sexual Activity: Not on file   Other Topics Concern  . Not on file   Social History Narrative   Lives w/ mother to help her out      Family History  Problem Relation Age of Onset  . Hypertension Mother   . Transient ischemic attack Maternal Grandfather   . Lung cancer Maternal Grandmother   . COPD Maternal Grandmother     uncle   . Diabetes      MG aunt  . Colon cancer Neg Hx   . Prostate cancer Neg Hx   . CAD Neg Hx        Medication List    Notice As of 03/26/2014  5:02 PM   You have not been prescribed any medications.         Objective:   Physical Exam BP 136/92  Pulse 87  Temp(Src) 98.3 F (36.8 C)  Ht 5' 11.8" (1.824 m)  Wt 342 lb (155.13 kg)  BMI 46.63 kg/m2  SpO2 97% General -- alert, well-developed, NAD.  Neck --no thyromegaly  HEENT-- Not pale.  Lungs -- normal respiratory effort, no intercostal retractions, no accessory muscle  use, and normal breath sounds.  Heart-- normal rate, regular rhythm, no murmur.  Abdomen-- Not distended, good bowel sounds,soft, non-tender. Extremities-- no pretibial edema bilaterally  Neurologic--  alert & oriented X3. Speech normal, gait appropriate for age, strength symmetric and appropriate for age.  Psych-- Cognition and judgment appear intact. Cooperative with normal attention span and concentration. No anxious or depressed appearing.     Assessment & Plan:

## 2014-03-26 NOTE — Assessment & Plan Note (Signed)
Doing well w/ lifestyle, labs

## 2014-03-26 NOTE — Patient Instructions (Signed)
Get your blood work before you leave    Check the  blood pressure 2 or 3 times a month be sure it is between 110/60 and 140/85. Ideal blood pressure is 120/80. If it is consistently higher or lower, let me know  Next visit is for routine check up in 6 months  No need to come back fasting Please make an appointment

## 2014-03-26 NOTE — Progress Notes (Signed)
Pre visit review using our clinic review tool, if applicable. No additional management support is needed unless otherwise documented below in the visit note. 

## 2014-03-28 ENCOUNTER — Other Ambulatory Visit: Payer: Self-pay | Admitting: Internal Medicine

## 2014-03-28 DIAGNOSIS — R7989 Other specified abnormal findings of blood chemistry: Secondary | ICD-10-CM | POA: Insufficient documentation

## 2014-03-28 DIAGNOSIS — R945 Abnormal results of liver function studies: Principal | ICD-10-CM

## 2014-04-09 ENCOUNTER — Ambulatory Visit
Admission: RE | Admit: 2014-04-09 | Discharge: 2014-04-09 | Disposition: A | Discharge status: Home / Self Care | Payer: 59 | Source: Ambulatory Visit | Attending: Internal Medicine | Admitting: Internal Medicine

## 2014-04-09 DIAGNOSIS — R7989 Other specified abnormal findings of blood chemistry: Secondary | ICD-10-CM

## 2014-04-09 DIAGNOSIS — R945 Abnormal results of liver function studies: Principal | ICD-10-CM

## 2014-09-28 ENCOUNTER — Encounter: Payer: Self-pay | Admitting: Internal Medicine

## 2014-09-28 ENCOUNTER — Ambulatory Visit (INDEPENDENT_AMBULATORY_CARE_PROVIDER_SITE_OTHER): Payer: 59 | Admitting: Internal Medicine

## 2014-09-28 VITALS — BP 149/87 | HR 73 | Temp 99.0°F | Ht 72.0 in | Wt 269.4 lb

## 2014-09-28 DIAGNOSIS — R7989 Other specified abnormal findings of blood chemistry: Secondary | ICD-10-CM

## 2014-09-28 DIAGNOSIS — R7301 Impaired fasting glucose: Secondary | ICD-10-CM

## 2014-09-28 DIAGNOSIS — R945 Abnormal results of liver function studies: Principal | ICD-10-CM

## 2014-09-28 LAB — AST: AST: 174 U/L — AB (ref 0–37)

## 2014-09-28 LAB — ALT: ALT: 70 U/L — ABNORMAL HIGH (ref 0–53)

## 2014-09-28 NOTE — Progress Notes (Signed)
Pre visit review using our clinic review tool, if applicable. No additional management support is needed unless otherwise documented below in the visit note. 

## 2014-09-28 NOTE — Assessment & Plan Note (Signed)
Slightly elevated blood sugar, has lost a significant amount of weight by eating healthier. Recheck the A1c on return to the office. BMI discussed.

## 2014-09-28 NOTE — Progress Notes (Signed)
   Subjective:    Patient ID   Dwayne Lewis, male    DOB: [birth year], 44 y.o.   MRN: [id number]  DOS:  09/28/2014 Type of visit - description : rov Interval history:  Since the last visit, he is doing well, has lost weight, diet has improved. He just started to exercise in the last couple weeks. History of increased LFTs, denies the use of Tylenol, OTCs, herbal medications. Does not drink  ROS  denies nausea, vomiting, diarrhea occ pain at the xyphoid process since he lost weight.   Past Medical History  Diagnosis Date  . Seasonal allergies     RAD with RTIs only  . Hyperlipidemia     75), HDL 27, TG 105. LDL goal= <110  . Other abnormal glucose 2012     A1c 6.2%     Past Surgical History  Procedure Laterality Date  . Tonsillectomy    . Lasik  2000    Bilaterally    History   Social History  . Marital Status: Single    Spouse Name: N/A    Number of Children: 0  . Years of Education: N/A   Occupational History  . [employer]    Social History Main Topics  . Smoking status: Never Smoker   . Smokeless tobacco: Never Used  . Alcohol Use: No  . Drug Use: No  . Sexual Activity: Not on file   Other Topics Concern  . Not on file   Social History Narrative   Lives w/ mother to help her out         Medication List    Notice  As of 09/28/2014 11:59 PM   You have not been prescribed any medications.         Objective:   Physical Exam BP 149/87 mmHg  Pulse 73  Temp(Src) 99 F (37.2 C) (Oral)  Ht 6' (1.829 m)  Wt 269 lb 6 oz (122.188 kg)  BMI 36.53 kg/m2  SpO2 100% General -- alert, well-developed, NAD.   Lungs -- normal respiratory effort, no intercostal retractions, no accessory muscle use, and normal breath sounds.  Heart-- normal rate, regular rhythm, no murmur.  abd-- minimal tender at the xyphoid  process otherwise abdomen exam is negative  Extremities-- no pretibial edema bilaterally  Neurologic--  alert & oriented X3. Speech normal, gait  appropriate for age, strength symmetric and appropriate for age.  Psych-- Cognition and judgment appear intact. Cooperative with normal attention span and concentration. No anxious or depressed appearing.        Assessment & Plan:

## 2014-09-28 NOTE — Assessment & Plan Note (Signed)
Patient reports a long history of elevated LFTs. Hepatitis B and C negative in 2010,  CBC with normal platelets Ultrasound 03-2014 showed  fatty liver. Most likely diagnosis is fatty liver, patient is doing great with weight loss, will recheck labs and additional tests: Transferring saturation, ceruloplasmin and alpha-1 antitrypsin.

## 2014-09-28 NOTE — Patient Instructions (Signed)
Get your blood work before you leave   Please come back to the office by 03-2015 for a physical exam. Come back fasting

## 2014-09-30 LAB — ALPHA-1-ANTITRYPSIN: A-1 Antitrypsin, Ser: 147 mg/dL (ref 83–199)

## 2014-09-30 LAB — CERULOPLASMIN: Ceruloplasmin: 28 mg/dL (ref 18–36)

## 2015-03-16 ENCOUNTER — Telehealth: Payer: Self-pay | Admitting: Internal Medicine

## 2015-03-16 NOTE — Telephone Encounter (Signed)
pre visit letter mailed 03/09/15

## 2015-03-29 ENCOUNTER — Encounter: Payer: Self-pay | Admitting: *Deleted

## 2015-03-29 ENCOUNTER — Telehealth: Payer: Self-pay | Admitting: *Deleted

## 2015-03-29 NOTE — Telephone Encounter (Signed)
Pre-Visit Call completed with patient and chart updated.   Pre-Visit Info documented in Specialty Comments under [employer].    

## 2015-03-29 NOTE — Telephone Encounter (Signed)
Unable to reach patient at time of Pre-Visit Call.  Left message for patient to return call when available.    

## 2015-03-30 ENCOUNTER — Ambulatory Visit (INDEPENDENT_AMBULATORY_CARE_PROVIDER_SITE_OTHER): Payer: 59 | Admitting: Internal Medicine

## 2015-03-30 ENCOUNTER — Encounter: Payer: Self-pay | Admitting: Internal Medicine

## 2015-03-30 VITALS — BP 118/78 | HR 86 | Temp 97.8°F | Ht 72.0 in | Wt 328.4 lb

## 2015-03-30 DIAGNOSIS — Z0000 Encounter for general adult medical examination without abnormal findings: Secondary | ICD-10-CM

## 2015-03-30 DIAGNOSIS — R945 Abnormal results of liver function studies: Secondary | ICD-10-CM

## 2015-03-30 DIAGNOSIS — R7301 Impaired fasting glucose: Secondary | ICD-10-CM

## 2015-03-30 DIAGNOSIS — R030 Elevated blood-pressure reading, without diagnosis of hypertension: Secondary | ICD-10-CM

## 2015-03-30 DIAGNOSIS — R7989 Other specified abnormal findings of blood chemistry: Secondary | ICD-10-CM

## 2015-03-30 NOTE — Assessment & Plan Note (Signed)
On no medications, BP is very good today

## 2015-03-30 NOTE — Patient Instructions (Signed)
Get your blood work before you leave    MYFITNESSPAL

## 2015-03-30 NOTE — Assessment & Plan Note (Addendum)
Td -- declined Never had a cscope Since last year, has regained all the way he loss, unfortunately is under a lot of stress and is doing a lot of comfort eating. We talk about diet, we took about appetite suppressants as an option. Recommend to come back in 3 months to reassess the situation Labs

## 2015-03-30 NOTE — Progress Notes (Signed)
Subjective:    Patient ID: Dwayne Lewis, male    DOB: 1971/01/24, 44 y.o.   MRN: [id number]  DOS:  03/30/2015 Type of visit - description : CPX Interval history: In general feeling well except for the fact of weight gain due to not being careful with his diet. His stress at work has increased significantly and she is doing a lot of stress eating.   Review of Systems Constitutional: No fever. No chills. No unexplained wt changes. No unusual sweats  HEENT: No dental problems, no ear discharge, no facial swelling, no voice changes. No eye discharge, no eye  redness , no  intolerance to light   Respiratory: No wheezing , no  difficulty breathing. No cough , no mucus production  Cardiovascular: No CP, no leg swelling , no  Palpitations  GI: no nausea, no vomiting, no diarrhea , no  abdominal pain.  No blood in the stools. No dysphagia, no odynophagia    Endocrine: No polyphagia, no polyuria , no polydipsia  GU: No dysuria, gross hematuria, difficulty urinating. No urinary urgency, no frequency.  Musculoskeletal: No joint swellings or unusual aches or pains  Skin: No change in the color of the skin, palor , no  Rash  Allergic, immunologic: No environmental allergies , no  food allergies  Neurological: No dizziness no  syncope. No headaches. No diplopia, no slurred, no slurred speech, no motor deficits, no facial  Numbness  Hematological: No enlarged lymph nodes, no easy bruising , no unusual bleedings  Psychiatry: No suicidal ideas, no hallucinations, no beavior problems, no confusion.    no depression    Past Medical History  Diagnosis Date  . Seasonal allergies     RAD with RTIs only  . Hyperlipidemia     75), HDL 27, TG 105. LDL goal= <110  . Other abnormal glucose 2012     A1c 6.2%     Past Surgical History  Procedure Laterality Date  . Tonsillectomy    . Lasik  2000    Bilaterally    History   Social History  . Marital Status: Single    Spouse Name:  N/A  . Number of Children: 0  . Years of Education: N/A   Occupational History  . [employer]    Social History Main Topics  . Smoking status: Never Smoker   . Smokeless tobacco: Never Used  . Alcohol Use: 0.0 oz/week    0 Standard drinks or equivalent per week     Comment: socially   . Drug Use: No  . Sexual Activity: Not on file   Other Topics Concern  . Not on file   Social History Narrative   Lives w/ mother to help her out (mother is Dwayne Lewis)     Family History  Problem Relation Age of Onset  . Hypertension Mother   . Transient ischemic attack Maternal Grandfather   . Lung cancer Maternal Grandmother   . COPD Maternal Grandmother     uncle   . Diabetes Other     MG aunt  . Colon cancer Neg Hx   . Prostate cancer Neg Hx   . CAD Neg Hx        Medication List    Notice  As of 03/30/2015  9:00 PM   You have not been prescribed any medications.         Objective:   Physical Exam BP 118/78 mmHg  Pulse 86  Temp(Src) 97.8 F (36.6 C) (Oral)  Ht 6' (1.829 m)  Wt 328 lb 6 oz (148.95 kg)  BMI 44.53 kg/m2  SpO2 97%    General:   Well developed, well nourished . NAD.  Neck:  Full range of motion. Supple. No  thyromegaly HEENT:  Normocephalic . Face symmetric, atraumatic Lungs:  CTA B Normal respiratory effort, no intercostal retractions, no accessory muscle use. Heart: RRR,  no murmur.  Trace pretibial edema bilaterally  Abdomen:  Not distended, soft, non-tender. No rebound or rigidity. No mass,organomegaly Skin: Exposed areas without rash. Not pale. Not jaundice Neurologic:  alert & oriented X3.  Speech normal, gait appropriate for age and unassisted Strength symmetric and appropriate for age.  Psych: Cognition and judgment appear intact.  Cooperative with normal attention span and concentration.  Behavior appropriate. No anxious or depressed appearing.  Assessment & Plan:

## 2015-03-30 NOTE — Progress Notes (Signed)
Pre visit review using our clinic review tool, if applicable. No additional management support is needed unless otherwise documented below in the visit note. 

## 2015-03-30 NOTE — Assessment & Plan Note (Signed)
Patient reports a long history of elevated LFTs. Hepatitis B and C negative in 2010  Ultrasound 03-2014 showed  fatty liver. Normal ceruloplasmin and alpha-1 antitrypsin 2015 Likely diagnosis is fatty liver, explained the patient this is not necessarily a benign condition, recommend weight loss.

## 2015-03-30 NOTE — Assessment & Plan Note (Signed)
Recheck an A1c today

## 2015-03-31 LAB — LIPID PANEL
CHOLESTEROL: 163 mg/dL (ref 0–200)
HDL: 39.6 mg/dL (ref >39.00)
NonHDL: 123.4
TRIGLYCERIDES: 220 mg/dL — AB (ref 0.0–149.0)
Total CHOL/HDL Ratio: 4
VLDL: 44 mg/dL — ABNORMAL HIGH (ref 0.0–40.0)

## 2015-03-31 LAB — TSH: TSH: 1.27 u[IU]/mL (ref 0.35–4.50)

## 2015-03-31 LAB — LDL CHOLESTEROL, DIRECT: Direct LDL: 92 mg/dL

## 2015-03-31 LAB — CBC WITH DIFFERENTIAL/PLATELET
Basophils Absolute: 0.1 10*3/uL (ref 0.0–0.1)
Basophils Relative: 1.3 % (ref 0.0–3.0)
EOS ABS: 0.1 10*3/uL (ref 0.0–0.7)
EOS PCT: 1.3 % (ref 0.0–5.0)
HCT: 41.5 % (ref 39.0–52.0)
Hemoglobin: 13.9 g/dL (ref 13.0–17.0)
LYMPHS PCT: 27.3 % (ref 12.0–46.0)
Lymphs Abs: 2.2 10*3/uL (ref 0.7–4.0)
MCHC: 33.6 g/dL (ref 30.0–36.0)
MCV: 86.3 fl (ref 78.0–100.0)
Monocytes Absolute: 0.7 10*3/uL (ref 0.1–1.0)
Monocytes Relative: 8.4 % (ref 3.0–12.0)
NEUTROS ABS: 4.9 10*3/uL (ref 1.4–7.7)
Neutrophils Relative %: 61.7 % (ref 43.0–77.0)
PLATELETS: 158 10*3/uL (ref 150.0–400.0)
RBC: 4.8 Mil/uL (ref 4.22–5.81)
RDW: 13.4 % (ref 11.5–15.5)
WBC: 8 10*3/uL (ref 4.0–10.5)

## 2015-03-31 LAB — COMPREHENSIVE METABOLIC PANEL
ALBUMIN: 4.1 g/dL (ref 3.5–5.2)
ALT: 23 U/L (ref 0–53)
AST: 25 U/L (ref 0–37)
Alkaline Phosphatase: 57 U/L (ref 39–117)
BUN: 13 mg/dL (ref 6–23)
CO2: 30 meq/L (ref 19–32)
Calcium: 9 mg/dL (ref 8.4–10.5)
Chloride: 100 mEq/L (ref 96–112)
Creatinine, Ser: 0.75 mg/dL (ref 0.40–1.50)
GFR: 118.96 mL/min (ref >60.00)
Glucose, Bld: 85 mg/dL (ref 70–99)
Potassium: 3.7 mEq/L (ref 3.5–5.1)
SODIUM: 138 meq/L (ref 135–145)
Total Bilirubin: 0.6 mg/dL (ref 0.2–1.2)
Total Protein: 6.8 g/dL (ref 6.0–8.3)

## 2015-03-31 LAB — HEMOGLOBIN A1C: HEMOGLOBIN A1C: 5.2 % (ref 4.6–6.5)

## 2015-05-12 ENCOUNTER — Encounter: Payer: Self-pay | Admitting: Internal Medicine

## 2015-05-24 ENCOUNTER — Ambulatory Visit (INDEPENDENT_AMBULATORY_CARE_PROVIDER_SITE_OTHER): Payer: 59 | Admitting: Internal Medicine

## 2015-05-24 ENCOUNTER — Encounter: Payer: Self-pay | Admitting: Internal Medicine

## 2015-05-24 VITALS — BP 138/72 | HR 109 | Temp 98.8°F | Ht 72.0 in | Wt 354.0 lb

## 2015-05-24 DIAGNOSIS — J069 Acute upper respiratory infection, unspecified: Secondary | ICD-10-CM | POA: Diagnosis not present

## 2015-05-24 MED ORDER — AZELASTINE HCL 0.1 % NA SOLN: 2.0000 | Freq: Every evening | NASAL | Status: DC | PRN

## 2015-05-24 NOTE — Patient Instructions (Signed)
Rest, fluids , tylenol  For cough: Take Mucinex DM twice a day as needed until better  For nasal congestion: OTC Nasocort or Flonase : 2 nasal sprays on each side of the nose daily until you feel better Prescribed Astelin every night    Call if not gradually better over the next  10 days  Call anytime if the symptoms are severe  Call if they gland on the right side is not back to normal in a couple of weeks

## 2015-05-24 NOTE — Progress Notes (Signed)
Pre visit review using our clinic review tool, if applicable. No additional management support is needed unless otherwise documented below in the visit note. 

## 2015-05-24 NOTE — Progress Notes (Signed)
   Subjective:    Patient ID: Dwayne Lewis, male    DOB: [birth year], 44 y.o.   MRN: [id number]  DOS:  05/24/2015 Type of visit - description : Acute visit Interval history:  Symptoms started 2 or 3 days ago with postnasal dripping, abundant. Right facial gland swelling , is also slightly TTP. Cough, dry, for the last day. Not taking any medication to help the symptoms.  Review of Systems  No fever chills No nausea, vomiting, muscle aches. No chest congestion. No dental pain per se  Past Medical History  Diagnosis Date  . Seasonal allergies     RAD with RTIs only  . Hyperlipidemia     75), HDL 27, TG 105. LDL goal= <110  . Other abnormal glucose 2012     A1c 6.2%     Past Surgical History  Procedure Laterality Date  . Tonsillectomy    . Lasik  2000    Bilaterally    Social History   Social History  . Marital Status: Single    Spouse Name: N/A  . Number of Children: 0  . Years of Education: N/A   Occupational History  . [employer]    Social History Main Topics  . Smoking status: Never Smoker   . Smokeless tobacco: Never Used  . Alcohol Use: 0.0 oz/week    0 Standard drinks or equivalent per week     Comment: socially   . Drug Use: No  . Sexual Activity: Not on file   Other Topics Concern  . Not on file   Social History Narrative   Lives w/ mother to help her out (mother is Gerome Kokesh)        Medication List    Notice  As of 05/24/2015  9:35 AM   You have not been prescribed any medications.         Objective:   Physical Exam  Neck:     BP 138/72 mmHg  Pulse 109  Temp(Src) 98.8 F (37.1 C) (Oral)  Ht 6' (1.829 m)  Wt 354 lb (160.573 kg)  BMI 48.00 kg/m2  SpO2 96% General:   Well developed, well nourished . NAD.  HEENT:  Normocephalic .atraumatic. has prominent but not tender parotid glands, they are symmetric. Tympanic membranes: Bulge, no red Nose  quite congested; sinuses not TTP. Throat symmetric, no redness, palpation  of the gum without swelling or tenderness Lungs:  CTA B Normal respiratory effort, no intercostal retractions, no accessory muscle use. Heart: RRR,  no murmur.  No pretibial edema bilaterally  Skin: Not pale. Not jaundice Neurologic:  alert & oriented X3.  Speech normal, gait appropriate for age and unassisted Psych--  Cognition and judgment appear intact.  Cooperative with normal attention span and concentration.  Behavior appropriate. No anxious or depressed appearing.      Assessment & Plan:   URI: Symptoms likely due to a URI, rx conservative treatment as described in the AVS. If tender right-sided lymphadenopathy not better, he is to let me know

## 2015-05-26 ENCOUNTER — Encounter: Payer: Self-pay | Admitting: Internal Medicine

## 2015-05-26 ENCOUNTER — Other Ambulatory Visit: Payer: Self-pay | Admitting: Internal Medicine

## 2015-05-26 MED ORDER — AZITHROMYCIN 250 MG PO TABS: ORAL_TABLET | ORAL | Status: DC

## 2015-07-05 ENCOUNTER — Encounter: Payer: Self-pay | Admitting: Internal Medicine

## 2015-07-05 ENCOUNTER — Ambulatory Visit (INDEPENDENT_AMBULATORY_CARE_PROVIDER_SITE_OTHER): Payer: 59 | Admitting: Internal Medicine

## 2015-07-05 VITALS — BP 126/84 | HR 96 | Temp 98.6°F | Ht 72.0 in | Wt 368.0 lb

## 2015-07-05 DIAGNOSIS — Z09 Encounter for follow-up examination after completed treatment for conditions other than malignant neoplasm: Secondary | ICD-10-CM

## 2015-07-05 DIAGNOSIS — Z6841 Body Mass Index (BMI) 40.0 and over, adult: Secondary | ICD-10-CM | POA: Diagnosis not present

## 2015-07-05 MED ORDER — PHENTERMINE-TOPIRAMATE ER 3.75-23 MG PO CP24: ORAL_CAPSULE | ORAL | Status: DC

## 2015-07-05 NOTE — Progress Notes (Signed)
Pre visit review using our clinic review tool, if applicable. No additional management support is needed unless otherwise documented below in the visit note. 

## 2015-07-05 NOTE — Progress Notes (Signed)
   Subjective:    Patient ID: Dwayne Lewis, male    DOB: 1970/09/24, 44 y.o.   MRN: [id number]  DOS:  07/05/2015 Type of visit - description : Follow-up from previous visit Interval history: During the physical exam, we discussed his weight, diet and exercise. He actually has not change much of his lifestyle, has gained some weight. The main barrier to care is his stressful job.  Review of Systems  Denies chest pain or difficulty breathing No nausea, vomiting, diarrhea  Past Medical History  Diagnosis Date  . Seasonal allergies     RAD with RTIs only  . Hyperlipidemia     75), HDL 27, TG 105. LDL goal= <110  . Other abnormal glucose 2012     A1c 6.2%     Past Surgical History  Procedure Laterality Date  . Tonsillectomy    . Lasik  2000    Bilaterally    Social History   Social History  . Marital Status: Single    Spouse Name: N/A  . Number of Children: 0  . Years of Education: N/A   Occupational History  . [employer]    Social History Main Topics  . Smoking status: Never Smoker   . Smokeless tobacco: Never Used  . Alcohol Use: 0.0 oz/week    0 Standard drinks or equivalent per week     Comment: socially   . Drug Use: No  . Sexual Activity: Not on file   Other Topics Concern  . Not on file   Social History Narrative   Lives w/ mother to help her out (mother is Dwayne Lewis)        Medication List       This list is accurate as of: 07/05/15 11:59 PM.  Always use your most recent med list.               Phentermine-Topiramate 3.75-23 MG Cp24  1 by mouth daily for 2 weeks, then one by mouth twice a day.           Objective:   Physical Exam BP 126/84 mmHg  Pulse 96  Temp(Src) 98.6 F (37 C) (Oral)  Ht 6' (1.829 m)  Wt 368 lb (166.924 kg)  BMI 49.90 kg/m2  SpO2 97% General:   Well developed, well nourished . NAD.  HEENT:  Normocephalic . Face symmetric, atraumatic Neurologic:  alert & oriented X3.  Speech normal, gait  appropriate for age and unassisted Psych--  Cognition and judgment appear intact.  Cooperative with normal attention span and concentration.  Behavior appropriate. No anxious or depressed appearing.      Assessment & Plan:   Assessment >  Hyperglycemia Hyperlipidemia Morbid Obesity  Elevated LFTs, chronic, likely fatty liver Hepatitis B and C negative in 2010  Ultrasound 03-2014 showed fatty liver. Normal ceruloplasmin and alpha-1 antitrypsin 2015   Plan   Obesity: BMI 49, he definitely needs help, has been unable to take the initiative to improved his lifestyle. We discuss the potential benefit from medication including helping him to "get started" and get him motivated to do better. After we discussed 3 alternatives, I rx Qsymia , how to use it, side effects discussed. Recommend to check his blood pressure few times after his starts the medication, again I encouraged him to do better with diet and exercise. Follow-up one month

## 2015-07-05 NOTE — Patient Instructions (Signed)
Start the medication one tablet daily for 2 weeks, then one tablet twice a day.  Start exercising, goal walk 30 minutes daily  Start a healthier diet. Calorie counting? MYFITNESSPAL ?    Check the  blood pressure 2 or 3 times a   Week  Be sure your blood pressure is between 110/65 and  145/85.  if it is consistently higher or lower, let me know   Next visit  for a checkup in one month, no need to be fasting.      (15  minutes) Please schedule an appointment at the front desk

## 2015-07-06 DIAGNOSIS — Z09 Encounter for follow-up examination after completed treatment for conditions other than malignant neoplasm: Secondary | ICD-10-CM | POA: Insufficient documentation

## 2015-07-06 NOTE — Assessment & Plan Note (Signed)
Obesity: BMI 49, he definitely needs help, has been unable to take the initiative to improved his lifestyle. We discuss the potential benefit from medication including helping him to "get started" and get him motivated to do better. After we discussed 3 alternatives, I rx Qsymia , how to use it, side effects discussed. Recommend to check his blood pressure few times after his starts the medication, again I encouraged him to do better with diet and exercise. Follow-up one month

## 2015-07-07 ENCOUNTER — Telehealth: Payer: Self-pay | Admitting: Internal Medicine

## 2015-07-07 MED ORDER — LORCASERIN HCL ER 20 MG PO TB24: 1.0000 | ORAL_TABLET | ORAL | Status: DC

## 2015-07-07 NOTE — Addendum Note (Signed)
Addended by: Tasia Catchings on: 07/07/2015 11:49 AM   Modules accepted: Orders, Medications

## 2015-07-07 NOTE — Telephone Encounter (Signed)
Spoke with Dwayne Lewis and he voices understanding. Will send in Rx. Dwayne Lewis stated with discount card that it would still cost him $300 and he does not have that amount of money.

## 2015-07-07 NOTE — Telephone Encounter (Signed)
I suggest he looks for a Discount cupon  online. If That Doesn't Work, he May like to Try the Following: Belviq XR 20 mg 1 po qd #30, 1 RF

## 2015-07-07 NOTE — Telephone Encounter (Signed)
Caller name: Eliah Marquard  Relationship to patient: Self   Can be reached: [phone]  Pharmacy: [facility], [place]  Reason for call: pt says that he cant afford the medication prescribed Phentermine-Topiramate 3.75-23 MG it's way to expensive. He would like to know if there is a different medication that's less expensive to prescribe.

## 2015-07-07 NOTE — Telephone Encounter (Signed)
Please advise 

## 2015-07-07 NOTE — Telephone Encounter (Signed)
[facility].

## 2015-08-10 ENCOUNTER — Ambulatory Visit: Payer: 59 | Admitting: Internal Medicine

## 2015-10-11 ENCOUNTER — Encounter: Payer: Self-pay | Admitting: Internal Medicine

## 2015-10-11 ENCOUNTER — Ambulatory Visit (INDEPENDENT_AMBULATORY_CARE_PROVIDER_SITE_OTHER): Payer: 59 | Admitting: Internal Medicine

## 2015-10-11 ENCOUNTER — Ambulatory Visit (HOSPITAL_BASED_OUTPATIENT_CLINIC_OR_DEPARTMENT_OTHER)
Admission: RE | Admit: 2015-10-11 | Discharge: 2015-10-11 | Disposition: A | Discharge status: Home / Self Care | Payer: 59 | Source: Ambulatory Visit | Attending: Internal Medicine | Admitting: Internal Medicine

## 2015-10-11 VITALS — BP 140/78 | HR 98 | Temp 98.8°F | Ht 72.0 in | Wt 397.6 lb

## 2015-10-11 DIAGNOSIS — S99912A Unspecified injury of left ankle, initial encounter: Secondary | ICD-10-CM

## 2015-10-11 DIAGNOSIS — S99911A Unspecified injury of right ankle, initial encounter: Secondary | ICD-10-CM | POA: Diagnosis not present

## 2015-10-11 DIAGNOSIS — W19XXXA Unspecified fall, initial encounter: Secondary | ICD-10-CM | POA: Insufficient documentation

## 2015-10-11 DIAGNOSIS — M7989 Other specified soft tissue disorders: Secondary | ICD-10-CM | POA: Diagnosis not present

## 2015-10-11 DIAGNOSIS — M25571 Pain in right ankle and joints of right foot: Secondary | ICD-10-CM | POA: Insufficient documentation

## 2015-10-11 NOTE — Progress Notes (Signed)
Pre visit review using our clinic review tool, if applicable. No additional management support is needed unless otherwise documented below in the visit note. 

## 2015-10-11 NOTE — Progress Notes (Signed)
   Subjective:    Patient ID: Dwayne Lewis, male    DOB: July 12, 1971, 45 y.o.   MRN: [id number]  DOS:  10/11/2015 Type of visit - description : Acute Interval history:  2 days ago was walking outside of his house, twisted his right ankle , fell and landed on the right side. Complaining of pain at the whole ankle, mostly externally;  taking Motrin with decrease of pain.  Review of Systems Denies any neck pain, back pain or other injuries  Past Medical History  Diagnosis Date  . Seasonal allergies     RAD with RTIs only  . Hyperlipidemia     75), HDL 27, TG 105. LDL goal= <110  . Other abnormal glucose 2012     A1c 6.2%     Past Surgical History  Procedure Laterality Date  . Tonsillectomy    . Lasik  2000    Bilaterally    Social History   Social History  . Marital Status: Single    Spouse Name: N/A  . Number of Children: 0  . Years of Education: N/A   Occupational History  . [employer]    Social History Main Topics  . Smoking status: Never Smoker   . Smokeless tobacco: Never Used  . Alcohol Use: 0.0 oz/week    0 Standard drinks or equivalent per week     Comment: socially   . Drug Use: No  . Sexual Activity: Not on file   Other Topics Concern  . Not on file   Social History Narrative   Lives w/ mother to help her out (mother is Yvon Deck)        Medication List    Notice  As of 10/11/2015  8:55 PM   You have not been prescribed any medications.         Objective:   Physical Exam BP 140/78 mmHg  Pulse 98  Temp(Src) 98.8 F (37.1 C) (Oral)  Ht 6' (1.829 m)  Wt 397 lb 9.6 oz (180.35 kg)  BMI 53.91 kg/m2  SpO2 98% General:   Well developed, well nourished . NAD.  HEENT:  Normocephalic . Face symmetric, atraumatic MSK: Left ankle normal Right ankle: Range of motion slightly limited particularly on abduction. Slightly TTP at the external malleolus. Mild swelling, no deformities, no ecchymosis, no warmness. Good pedal  pulse. Neurologic:  alert & oriented X3.  Speech normal, gait slt difficult d/t painisted Psych--  Cognition and judgment appear intact.  Cooperative with normal attention span and concentration.  Behavior appropriate. No anxious or depressed appearing.      Assessment & Plan:   Assessment >   Prediabetes: a1c 6.2 (2012)  Hyperlipidemia Morbid Obesity  Elevated LFTs, chronic, likely fatty liver Hepatitis B and C negative in 2010  Ultrasound 03-2014 showed fatty liver. Normal ceruloplasmin and alpha-1 antitrypsin 2015  Seasonal allergies and RAD   PLAN Ankle sprain, injury: Check x-ray, rice. Call if not better

## 2015-10-11 NOTE — Patient Instructions (Addendum)
Get a XR, first floor ICE once or twice a day Call if not improving soon Get a brace Motrin     IBUPROFEN (Advil or Motrin) 200 mg 2 tablets every 6 hours as needed for pain.  Always take it with food because may cause gastritis and ulcers.  If you notice nausea, stomach pain, change in the color of stools --->  Stop the medicine and let us know      RICE for Routine Care of Injuries Theroutine careofmanyinjuriesincludes rest, ice, compression, and elevation (RICE therapy). RICE therapy is often recommended for injuries to soft tissues, such as a muscle strain, ligament injuries, bruises, and overuse injuries. It can also be used for some bony injuries. Using RICE therapy can help to relieve pain, lessen swelling, and enable your body to heal. Rest Rest is required to allow your body to heal. This usually involves reducing your normal activities and avoiding use of the injured part of your body. Generally, you can return to your normal activities when you are comfortable and have been given permission by your health care provider. Ice Icing your injury helps to keep the swelling down, and it lessens pain. Do not apply ice directly to your skin.  Put ice in a plastic bag.  Place a towel between your skin and the bag.  Leave the ice on for 20 minutes, 2-3 times a day. Do this for as long as you are directed by your health care provider. Compression Compression means putting pressure on the injured area. Compression helps to keep swelling down, gives support, and helps with discomfort. Compression may be done with an elastic bandage. If an elastic bandage has been applied, follow these general tips:  Remove and reapply the bandage every 3-4 hours or as directed by your health care provider.  Make sure the bandage is not wrapped too tightly, because this can cut off circulation. If part of your body beyond the bandage becomes blue, numb, cold, swollen, or more painful, your  bandage is most likely too tight. If this occurs, remove your bandage and reapply it more loosely.  See your health care provider if the bandage seems to be making your problems worse rather than better. Elevation Elevation means keeping the injured area raised. This helps to lessen swelling and decrease pain. If possible, your injured area should be elevated at or above the level of your heart or the center of your chest. Kaka? You should seek medical care if:  Your pain and swelling continue.  Your symptoms are getting worse rather than improving. These symptoms may indicate that further evaluation or further X-rays are needed. Sometimes, X-rays may not show a small broken bone (fracture) until a number of days later. Make a follow-up appointment with your health care provider. WHEN SHOULD I SEEK IMMEDIATE MEDICAL CARE? You should seek immediate medical care if:  You have sudden severe pain at or below the area of your injury.  You have redness or increased swelling around your injury.  You have tingling or numbness at or below the area of your injury that does not improve after you remove the elastic bandage.   This information is not intended to replace advice given to you by your health care provider. Make sure you discuss any questions you have with your health care provider.   Document Released: 12/10/2000 Document Revised: 05/19/2015 Document Reviewed: 08/05/2014 Elsevier Interactive Patient Education [employer].

## 2015-11-10 ENCOUNTER — Encounter: Payer: Self-pay | Admitting: Internal Medicine

## 2015-11-10 ENCOUNTER — Ambulatory Visit (INDEPENDENT_AMBULATORY_CARE_PROVIDER_SITE_OTHER): Payer: 59 | Admitting: Internal Medicine

## 2015-11-10 VITALS — BP 144/88 | HR 107 | Temp 99.4°F | Ht 72.0 in | Wt >= 6400 oz

## 2015-11-10 DIAGNOSIS — R509 Fever, unspecified: Secondary | ICD-10-CM

## 2015-11-10 DIAGNOSIS — B349 Viral infection, unspecified: Secondary | ICD-10-CM | POA: Diagnosis not present

## 2015-11-10 DIAGNOSIS — R52 Pain, unspecified: Secondary | ICD-10-CM | POA: Diagnosis not present

## 2015-11-10 DIAGNOSIS — Z09 Encounter for follow-up examination after completed treatment for conditions other than malignant neoplasm: Secondary | ICD-10-CM

## 2015-11-10 LAB — POCT INFLUENZA A/B
INFLUENZA A, POC: NEGATIVE
INFLUENZA B, POC: NEGATIVE

## 2015-11-10 MED ORDER — OSELTAMIVIR PHOSPHATE 75 MG PO CAPS: 75.0000 mg | ORAL_CAPSULE | Freq: Two times a day (BID) | ORAL | Status: DC

## 2015-11-10 MED ORDER — HYDROCODONE-HOMATROPINE 5-1.5 MG/5ML PO SYRP: 5.0000 mL | ORAL_SOLUTION | Freq: Every evening | ORAL | Status: DC | PRN

## 2015-11-10 NOTE — Assessment & Plan Note (Signed)
Viral syndrome: Flu test negative however symptoms consistent with influenza. Will treat empirically with Tamiflu, knows to call me if he gets much worse or has protracted symptoms. His mother is my patient, she needs Tamiflu prophylactically, a prescription was sent, asked patient notify her mom, they live together, states he will.

## 2015-11-10 NOTE — Progress Notes (Signed)
Subjective:    Patient ID: Dwayne Lewis, male    DOB: 28-Mar-1971, 45 y.o.   MRN: [id number]  DOS:  11/10/2015 Type of visit - description : Acute visit Interval history: had a episode of fever for 3 days, last month. Then he felt relatively well until yesterday: Developed a fever of  101, cough, right-sided chest pain with cough only. Also joint pain all over. A coworker was dx with the flu.   Review of Systems  Denies sore throat No sinus pain or congestion. No nasal discharge No nausea, vomiting, diarrhea. No sputum production No headache or rash  Past Medical History  Diagnosis Date  . Seasonal allergies     RAD with RTIs only  . Hyperlipidemia     75), HDL 27, TG 105. LDL goal= <110  . Other abnormal glucose 2012     A1c 6.2%     Past Surgical History  Procedure Laterality Date  . Tonsillectomy    . Lasik  2000    Bilaterally    Social History   Social History  . Marital Status: Single    Spouse Name: N/A  . Number of Children: 0  . Years of Education: N/A   Occupational History  . [employer]    Social History Main Topics  . Smoking status: Never Smoker   . Smokeless tobacco: Never Used  . Alcohol Use: 0.0 oz/week    0 Standard drinks or equivalent per week     Comment: socially   . Drug Use: No  . Sexual Activity: Not on file   Other Topics Concern  . Not on file   Social History Narrative   Lives w/ mother to help her out (mother is Lamoyne Delin)        Medication List       This list is accurate as of: 11/10/15  7:43 PM.  Always use your most recent med list.               HYDROcodone-homatropine 5-1.5 MG/5ML syrup  Commonly known as:  HYCODAN  Take 5 mLs by mouth at bedtime as needed for cough.     oseltamivir 75 MG capsule  Commonly known as:  TAMIFLU  Take 1 capsule (75 mg total) by mouth 2 (two) times daily.           Objective:   Physical Exam BP 144/88 mmHg  Pulse 107  Temp(Src) 99.4 F (37.4 C) (Oral)  Ht 6'  (1.829 m)  Wt 405 lb 2 oz (183.763 kg)  BMI 54.93 kg/m2  SpO2 96% General:   Well developed, well nourished . NAD. Frequent cough noted HEENT:  Normocephalic . Face symmetric, atraumatic. Nose congested, sinuses not TTP, throat symmetric red, TMs slightly bulge but no red Lungs:  CTA B Normal respiratory effort, no intercostal retractions, no accessory muscle use. Heart: RRR,  no murmur.  No pretibial edema bilaterally  Skin: Not pale. Not jaundice Neurologic:  alert & oriented X3.  Speech normal, gait appropriate for age and unassisted Psych--  Cognition and judgment appear intact.  Cooperative with normal attention span and concentration.  Behavior appropriate. No anxious or depressed appearing.      Assessment & Plan:   Assessment >  Prediabetes: a1c 6.2 (2012)  Hyperlipidemia Morbid Obesity  Elevated LFTs, chronic, likely fatty liver Hepatitis B and C negative in 2010  Ultrasound 03-2014 showed fatty liver. Normal ceruloplasmin and alpha-1 antitrypsin 2015  Seasonal allergies and RAD  PLAN  Viral syndrome: Flu test negative however symptoms consistent with influenza. Will treat empirically with Tamiflu, knows to call me if he gets much worse or has protracted symptoms. His mother is my patient, she needs Tamiflu prophylactically, a prescription was sent, asked patient notify her mom, they live together, states he will.

## 2015-11-10 NOTE — Progress Notes (Signed)
Pre visit review using our clinic review tool, if applicable. No additional management support is needed unless otherwise documented below in the visit note. 

## 2015-11-10 NOTE — Patient Instructions (Addendum)
Rest, fluids , tylenol  For cough:  Take Mucinex DM twice a day as needed until better For severe cough at night, take hydrocodone, will cause drowsiness  For nasal congestion: Use OTC Nasocort or Flonase : 2 nasal sprays on each side of the nose in the morning until you feel better  Avoid decongestants such as  Pseudoephedrine or phenylephrine     Take tamiflu for 5 days  Call if not gradually better over the next  10 days  Call anytime if the symptoms are severe, you have high fever, short of breath, chest pain  ---- Also, please advise your mom that we will call Tamiflu: 1 tablet daily for 10 days but if she develops any flu symptoms needs to be seen.

## 2016-01-16 ENCOUNTER — Encounter: Payer: Self-pay | Admitting: Internal Medicine

## 2017-01-19 ENCOUNTER — Encounter: Payer: Self-pay | Admitting: Internal Medicine

## 2017-01-19 ENCOUNTER — Ambulatory Visit (INDEPENDENT_AMBULATORY_CARE_PROVIDER_SITE_OTHER): Payer: BLUE CROSS/BLUE SHIELD | Admitting: Internal Medicine

## 2017-01-19 VITALS — BP 132/80 | HR 80 | Temp 98.7°F | Resp 14 | Ht 72.0 in | Wt >= 6400 oz

## 2017-01-19 DIAGNOSIS — Z0000 Encounter for general adult medical examination without abnormal findings: Secondary | ICD-10-CM | POA: Diagnosis not present

## 2017-01-19 LAB — CBC WITH DIFFERENTIAL/PLATELET
Basophils Absolute: 0 cells/uL (ref 0–200)
Basophils Relative: 0 %
EOS PCT: 1 %
Eosinophils Absolute: 98 cells/uL (ref 15–500)
HCT: 40.9 % (ref 38.5–50.0)
HEMOGLOBIN: 13.4 g/dL (ref 13.2–17.1)
LYMPHS ABS: 2352 {cells}/uL (ref 850–3900)
Lymphocytes Relative: 24 %
MCH: 27.5 pg (ref 27.0–33.0)
MCHC: 32.8 g/dL (ref 32.0–36.0)
MCV: 83.8 fL (ref 80.0–100.0)
MPV: 10.4 fL (ref 7.5–12.5)
Monocytes Absolute: 588 cells/uL (ref 200–950)
Monocytes Relative: 6 %
NEUTROS ABS: 6762 {cells}/uL (ref 1500–7800)
Neutrophils Relative %: 69 %
PLATELETS: 185 10*3/uL (ref 140–400)
RBC: 4.88 MIL/uL (ref 4.20–5.80)
RDW: 15.1 % — ABNORMAL HIGH (ref 11.0–15.0)
WBC: 9.8 10*3/uL (ref 3.8–10.8)

## 2017-01-19 LAB — TSH: TSH: 1.3 m[IU]/L (ref 0.40–4.50)

## 2017-01-19 LAB — COMPREHENSIVE METABOLIC PANEL
ALBUMIN: 4.2 g/dL (ref 3.6–5.1)
ALK PHOS: 77 U/L (ref 40–115)
ALT: 63 U/L — AB (ref 9–46)
AST: 96 U/L — AB (ref 10–40)
BILIRUBIN TOTAL: 1 mg/dL (ref 0.2–1.2)
BUN: 10 mg/dL (ref 7–25)
CO2: 30 mmol/L (ref 20–31)
CREATININE: 0.85 mg/dL (ref 0.60–1.35)
Calcium: 9.1 mg/dL (ref 8.6–10.3)
Chloride: 100 mmol/L (ref 98–110)
GLUCOSE: 101 mg/dL — AB (ref 65–99)
Potassium: 3.9 mmol/L (ref 3.5–5.3)
SODIUM: 140 mmol/L (ref 135–146)
Total Protein: 6.7 g/dL (ref 6.1–8.1)

## 2017-01-19 LAB — LIPID PANEL
Cholesterol: 174 mg/dL (ref <200)
HDL: 34 mg/dL — ABNORMAL LOW (ref >40)
LDL CALC: 109 mg/dL — AB (ref <100)
Total CHOL/HDL Ratio: 5.1 Ratio — ABNORMAL HIGH (ref <5.0)
Triglycerides: 154 mg/dL — ABNORMAL HIGH (ref <150)
VLDL: 31 mg/dL — ABNORMAL HIGH (ref <30)

## 2017-01-19 NOTE — Progress Notes (Signed)
Subjective:    Patient ID: Dwayne Lewis, male    DOB: [birth year], 46 y.o.   MRN: [id number]  DOS:  01/19/2017 Type of visit - description : CPX Interval history: Here for a CPX. He has some symptoms, see below.  Wt Readings from Last 3 Encounters:  01/19/17 (!) 411 lb 6 oz (186.6 kg)  11/10/15 (!) 405 lb 2 oz (183.8 kg)  10/11/15 (!) 397 lb 9.6 oz (180.4 kg)     Review of Systems For the last several months has some difficulty breathing with exertion without chest pain, cough or wheezing. He does have wheezing sometimes but is seasonal issue when the pollen is high. 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 Has developed a back pain, he suspect is positional due to seat in front of the  computer all day. Lifestyle is sedentary  Other than above, a 14 point review of systems is negative       Past Medical History:  Diagnosis Date  . Hyperlipidemia    73), HDL 27, TG 105. LDL goal= <110  . Other abnormal glucose 2012    A1c 6.2%   . Seasonal allergies    RAD with RTIs only    Past Surgical History:  Procedure Laterality Date  . LASIK  2000   Bilaterally  . TONSILLECTOMY      Social History   Social History  . Marital status: Single    Spouse name: N/A  . Number of children: 0  . Years of education: N/A   Occupational History  . works for a Agricultural engineer History Main Topics  . Smoking status: Never Smoker  . Smokeless tobacco: Never Used  . Alcohol use 0.0 oz/week     Comment: socially   . Drug use: No  . Sexual activity: Not on file   Other Topics Concern  . Not on file   Social History Narrative   Lives w/ mother to help her out (mother is [name])     Family History  Problem Relation Age of Onset  . Hypertension Mother   . Transient ischemic attack Maternal Grandfather   . Lung  cancer Maternal Grandmother   . COPD Maternal Grandmother        uncle   . Diabetes Other        MG aunt  . Colon cancer Neg Hx   . Prostate cancer Neg Hx   . CAD Neg Hx      Allergies as of 01/19/2017      Reactions   Penicillins    Rash   Sulfonamide Derivatives    REACTION: rash      Medication List    as of 01/19/2017 11:59 PM   You have not been prescribed any medications.        Objective:   Physical Exam BP 132/80 (BP Location: Left Wrist, Patient Position: Sitting, Cuff Size: Normal)   Pulse 80   Temp 98.7 F (37.1 C) (Oral)   Resp 14   Ht 6' (1.829 m)   Wt (!) 411 lb 6 oz (186.6 kg)   SpO2 98%   BMI 55.79 kg/m   General:   Well developed, morbidly obese appearing gentleman . NAD.  Neck: No  thyromegaly  HEENT:  Normocephalic . Face symmetric, atraumatic Lungs:  CTA B Normal respiratory effort, no intercostal retractions, no accessory muscle use. Heart: RRR,  no murmur.  No pretibial edema bilaterally  Abdomen:  Not distended, soft, non-tender. No rebound or rigidity.   Skin: Exposed areas without rash. Not pale. Not jaundice Neurologic:  alert & oriented X3.  Speech normal, gait appropriate for age and unassisted Strength symmetric and appropriate for age.  Psych: Cognition and judgment appear intact.  Cooperative with normal attention span and concentration.  Behavior appropriate. No anxious or depressed appearing.    Assessment & Plan:     Assessment    Prediabetes: a1c 6.2 (2012)  Hyperlipidemia Morbid Obesity  Elevated LFTs, chronic, likely fatty liver Hepatitis B and C negative in 2010  Ultrasound 03-2014 showed fatty liver. Normal ceruloplasmin and alpha-1 antitrypsin 2015  Seasonal allergies and RAD  PLAN Prediabetes: Check labs Hyperlipidemia: Checking labs Morbid obesity: This is by far his biggest medical problem, we had a long discussion about consequences that includes chronic medical problem and acute ones including  early death. At this point I don't recommend him to start exercise program rather work on his diet. He actually make some changes the last 3 days. We talk about medications, states he won't be able to do any amphetamines due to history of palpitation. We talk about bariatric surgery but he states he won't do that. At this point I asked him to think about SAXENDA and either come back for that here or see the bariatric clinic. DOE: Without chest pain, in the setting of morbid obesity and inactivity. Recommend observation for now. RTC 3 months

## 2017-01-19 NOTE — Assessment & Plan Note (Signed)
--  Td -- declined --CCS: Never had a cscope --prostate ca screening: not indicated  --CMP, FLP, CBC, A1c, TSH --Extensive discussion about diet, see comments under morbid obesity

## 2017-01-19 NOTE — Patient Instructions (Addendum)
GO TO THE FRONT DESK Schedule labs to be done next week, fasting  Schedule your next appointment for a  routine checkup in 3 months  Continue your efforts to eat healthier.  Think about a medication called SAXENDA.  If you decide to proceed with that medicine  Call or come back to this office or you can also visit the healthy weight and wellness clinic.   Obesity, Adult Obesity is the condition of having too much total body fat. Being overweight or obese means that your weight is greater than what is considered healthy for your body size. Obesity is determined by a measurement called BMI. BMI is an estimate of body fat and is calculated from height and weight. For adults, a BMI of 30 or higher is considered obese. Obesity can eventually lead to other health concerns and major illnesses, including:  Stroke.  Coronary artery disease (CAD).  Type 2 diabetes.  Some types of cancer, including cancers of the colon, breast, uterus, and gallbladder.  Osteoarthritis.  High blood pressure (hypertension).  High cholesterol.  Sleep apnea.  Gallbladder stones.  Infertility problems. What are the causes? The main cause of obesity is taking in (consuming) more calories than your body uses for energy. Other factors that contribute to this condition may include:  Being born with genes that make you more likely to become obese.  Having a medical condition that causes obesity. These conditions include:  Hypothyroidism.  Polycystic ovarian syndrome (PCOS).  Binge-eating disorder.  Cushing syndrome.  Taking certain medicines, such as steroids, antidepressants, and seizure medicines.  Not being physically active (sedentary lifestyle).  Living where there are limited places to exercise safely or buy healthy foods.  Not getting enough sleep. What increases the risk? The following factors may increase your risk of this condition:  Having a family history of obesity.  Being a  woman of African-American descent.  Being a man of Hispanic descent. What are the signs or symptoms? Having excessive body fat is the main symptom of this condition. How is this diagnosed? This condition may be diagnosed based on:  Your symptoms.  Your medical history.  A physical exam. Your health care provider may measure:  Your BMI. If you are an adult with a BMI between 25 and less than 30, you are considered overweight. If you are an adult with a BMI of 30 or higher, you are considered obese.  The distances around your hips and your waist (circumferences). These may be compared to each other to help diagnose your condition.  Your skinfold thickness. Your health care provider may gently pinch a fold of your skin and measure it. How is this treated? Treatment for this condition often includes changing your lifestyle. Treatment may include some or all of the following:  Dietary changes. Work with your health care provider and a dietitian to set a weight-loss goal that is healthy and reasonable for you. Dietary changes may include eating:  Smaller portions. A portion size is the amount of a particular food that is healthy for you to eat at one time. This varies from person to person.  Low-calorie or low-fat options.  More whole grains, fruits, and vegetables.  Regular physical activity. This may include aerobic activity (cardio) and strength training.  Medicine to help you lose weight. Your health care provider may prescribe medicine if you are unable to lose 1 pound a week after 6 weeks of eating more healthily and doing more physical activity.  Surgery. Surgical options may  include gastric banding and gastric bypass. Surgery may be done if:  Other treatments have not helped to improve your condition.  You have a BMI of 40 or higher.  You have life-threatening health problems related to obesity. Follow these instructions at home:   Eating and drinking    Follow  recommendations from your health care provider about what you eat and drink. Your health care provider may advise you to:  Limit fast foods, sweets, and processed snack foods.  Choose low-fat options, such as low-fat milk instead of whole milk.  Eat 5 or more servings of fruits or vegetables every day.  Eat at home more often. This gives you more control over what you eat.  Choose healthy foods when you eat out.  Learn what a healthy portion size is.  Keep low-fat snacks on hand.  Avoid sugary drinks, such as soda, fruit juice, iced tea sweetened with sugar, and flavored milk.  Eat a healthy breakfast.  Drink enough water to keep your urine clear or pale yellow.  Do not go without eating for long periods of time (do not fast) or follow a fad diet. Fasting and fad diets can be unhealthy and even dangerous. Physical Activity   Exercise regularly, as told by your health care provider. Ask your health care provider what types of exercise are safe for you and how often you should exercise.  Warm up and stretch before being active.  Cool down and stretch after being active.  Rest between periods of activity. Lifestyle   Limit the time that you spend in front of your TV, computer, or video game system.  Find ways to reward yourself that do not involve food.  Limit alcohol intake to no more than 1 drink a day for nonpregnant women and 2 drinks a day for men. One drink equals 12 oz of beer, 5 oz of wine, or 1 oz of hard liquor. General instructions   Keep a weight loss journal to keep track of the food you eat and how much you exercise you get.  Take over-the-counter and prescription medicines only as told by your health care provider.  Take vitamins and supplements only as told by your health care provider.  Consider joining a support group. Your health care provider may be able to recommend a support group.  Keep all follow-up visits as told by your health care provider.  This is important. Contact a health care provider if:  You are unable to meet your weight loss goal after 6 weeks of dietary and lifestyle changes. This information is not intended to replace advice given to you by your health care provider. Make sure you discuss any questions you have with your health care provider. Document Released: 10/05/2004 Document Revised: 01/31/2016 Document Reviewed: 06/16/2015 Elsevier Interactive Patient Education  2017 [employer].

## 2017-01-19 NOTE — Progress Notes (Signed)
Pre visit review using our clinic review tool, if applicable. No additional management support is needed unless otherwise documented below in the visit note. 

## 2017-01-20 LAB — HEMOGLOBIN A1C
HEMOGLOBIN A1C: 7.3 % — AB (ref <5.7)
Mean Plasma Glucose: 163 mg/dL

## 2017-01-21 ENCOUNTER — Encounter: Payer: Self-pay | Admitting: Internal Medicine

## 2017-01-21 NOTE — Assessment & Plan Note (Signed)
Prediabetes: Check labs Hyperlipidemia: Checking labs Morbid obesity: This is by far his biggest medical problem, we had a long discussion about consequences that includes chronic medical problem and acute ones including early death. At this point I don't recommend him to start exercise program rather work on his diet. He actually make some changes the last 3 days. We talk about medications, states he won't be able to do any amphetamines due to history of palpitation. We talk about bariatric surgery but he states he won't do that. At this point I asked him to think about SAXENDA and either come back for that here or see the bariatric clinic. DOE: Without chest pain, in the setting of morbid obesity and inactivity. Recommend observation for now. RTC 3 months

## 2017-01-23 ENCOUNTER — Telehealth: Payer: Self-pay

## 2017-01-23 MED ORDER — LIRAGLUTIDE -WEIGHT MANAGEMENT 18 MG/3ML ~~LOC~~ SOPN: 3.0000 mg | PEN_INJECTOR | Freq: Every day | SUBCUTANEOUS | 3 refills | Status: DC

## 2017-01-23 MED ORDER — INSULIN PEN NEEDLE 32G X 6 MM MISC: 1 refills | Status: DC

## 2017-01-23 NOTE — Telephone Encounter (Signed)
Will try to initiate PA for Saxenda to see if that will help with cost of medication.

## 2017-01-23 NOTE — Telephone Encounter (Signed)
[facility] and stated that insurance will not cover rx and needles that was sent (pt has to pay full price- pt does not have money to pay for it), pt would like to know if there is something else that will cover insurance since pt is coming in the office next wk to have saxenda teaching on 01-31-2017. Please advise.

## 2017-01-23 NOTE — Telephone Encounter (Signed)
Patient scheduled saxenda consult with nurse for 01/31/17.

## 2017-01-23 NOTE — Telephone Encounter (Signed)
Saxenda sample in fridge w/ Pt's name. [facility]

## 2017-01-23 NOTE — Telephone Encounter (Signed)
PA initiated via Covermymeds; KEY: [id number]. Awaiting determination.

## 2017-01-29 ENCOUNTER — Telehealth: Payer: Self-pay | Admitting: Internal Medicine

## 2017-01-29 NOTE — Telephone Encounter (Signed)
Inform the patient of his insurance decision. Recommend him to call his insurance to see what can be done.

## 2017-01-29 NOTE — Telephone Encounter (Signed)
Caller name: Mary Relation to pt: Mother  Call back number: [phone] Pharmacy:  Reason for call: Pt's mother states insurance needs to have preautorization for Liraglutide -Weight Management (SAXENDA) 18 MG/3ML SOPN rx (insurance is BC[employer]). Please advise.

## 2017-01-29 NOTE — Telephone Encounter (Signed)
PA denied, requested drug is plan exclusion. Appeal denial upheld, please advise.

## 2017-01-29 NOTE — Telephone Encounter (Signed)
My Chart message sent

## 2017-01-29 NOTE — Telephone Encounter (Signed)
PA has already been completed and denied by insurance, see previous telephone note.

## 2017-01-30 ENCOUNTER — Telehealth: Payer: Self-pay | Admitting: Behavioral Health

## 2017-01-30 NOTE — Telephone Encounter (Signed)
Patient voiced that he has been made aware via [employer] messaging that his insurance company has denied coverage for the Saxenda medication. He cancelled nurse visit appointment for tomorrow. Also, patient requested to change his August appointment. The 3 month follow-up has now been rescheduled for 04/17/17 at 4:00 PM with PCP.

## 2017-01-31 ENCOUNTER — Institutional Professional Consult (permissible substitution): Payer: BLUE CROSS/BLUE SHIELD

## 2017-04-17 ENCOUNTER — Encounter: Payer: Self-pay | Admitting: Internal Medicine

## 2017-04-17 ENCOUNTER — Ambulatory Visit (INDEPENDENT_AMBULATORY_CARE_PROVIDER_SITE_OTHER): Payer: BLUE CROSS/BLUE SHIELD | Admitting: Internal Medicine

## 2017-04-17 VITALS — BP 134/76 | HR 100 | Temp 98.5°F | Resp 14 | Ht 72.0 in | Wt >= 6400 oz

## 2017-04-17 DIAGNOSIS — R7989 Other specified abnormal findings of blood chemistry: Secondary | ICD-10-CM | POA: Diagnosis not present

## 2017-04-17 DIAGNOSIS — Z6841 Body Mass Index (BMI) 40.0 and over, adult: Secondary | ICD-10-CM | POA: Diagnosis not present

## 2017-04-17 DIAGNOSIS — M545 Low back pain, unspecified: Secondary | ICD-10-CM

## 2017-04-17 DIAGNOSIS — E118 Type 2 diabetes mellitus with unspecified complications: Secondary | ICD-10-CM | POA: Diagnosis not present

## 2017-04-17 DIAGNOSIS — R945 Abnormal results of liver function studies: Secondary | ICD-10-CM

## 2017-04-17 MED ORDER — CYCLOBENZAPRINE HCL 10 MG PO TABS: 10.0000 mg | ORAL_TABLET | Freq: Every evening | ORAL | 0 refills | Status: DC | PRN

## 2017-04-17 MED ORDER — MELOXICAM 7.5 MG PO TABS: 7.5000 mg | ORAL_TABLET | Freq: Every day | ORAL | 0 refills | Status: DC

## 2017-04-17 NOTE — Patient Instructions (Addendum)
GO TO THE LAB : Get the blood work     GO TO THE FRONT DESK Schedule your next appointment for a  Check up in 3 months   Think about see a bariatric doctor   Back pain: Flexeril, a muscle relaxant, 1 tablet at night, will cause drowsiness.  Meloxicam: 1 or 2 tablets a day, this is an anti-inflammatory. Do not mix it with ibuprofen. Always take it with food because may cause gastritis and ulcers.  If you notice nausea, stomach pain, change in the color of stools --->  Stop the medicine and let us know   Heat, cold.  Call if not improving in few days

## 2017-04-17 NOTE — Progress Notes (Signed)
Subjective:    Patient ID: Dwayne Lewis, male    DOB: [birth year], 46 y.o.   MRN: [id number]  DOS:  04/17/2017 Type of visit - description : f/u Interval history: DM: Last A1c> 7, he is diabetic, he is doing better with diet, has lost a few pounds. Also, 2 days ago was doing some lifting in his porch, he suddenly developed mid low back pain, no radiation, no lower extremity paresthesias.  Wt Readings from Last 3 Encounters:  04/17/17 (!) 402 lb 4 oz (182.5 kg)  01/19/17 (!) 411 lb 6 oz (186.6 kg)  11/10/15 (!) 405 lb 2 oz (183.8 kg)     Review of Systems   Past Medical History:  Diagnosis Date  . Hyperlipidemia    73), HDL 27, TG 105. LDL goal= <110  . Other abnormal glucose 2012    A1c 6.2%   . Seasonal allergies    RAD with RTIs only    Past Surgical History:  Procedure Laterality Date  . LASIK  2000   Bilaterally  . TONSILLECTOMY      Social History   Social History  . Marital status: Single    Spouse name: N/A  . Number of children: 0  . Years of education: N/A   Occupational History  . works for a Agricultural engineer History Main Topics  . Smoking status: Never Smoker  . Smokeless tobacco: Never Used  . Alcohol use 0.0 oz/week     Comment: socially   . Drug use: No  . Sexual activity: Not on file   Other Topics Concern  . Not on file   Social History Narrative   Lives w/ mother to help her out (mother is Moses Odoherty)      Allergies as of 04/17/2017      Reactions   Penicillins    Rash   Sulfonamide Derivatives    REACTION: rash      Medication List       Accurate as of 04/17/17 11:59 PM. Always use your most recent med list.          cyclobenzaprine 10 MG tablet Commonly known as:  FLEXERIL Take 1 tablet (10 mg total) by mouth at bedtime as needed for muscle spasms.   meloxicam 7.5 MG tablet Commonly known as:  MOBIC Take 1-2 tablets (7.5-15 mg total) by mouth daily.          Objective:   Physical Exam BP 134/76 (BP  Location: Left Arm, Patient Position: Sitting, Cuff Size: Normal)   Pulse 100   Temp 98.5 F (36.9 C) (Oral)   Resp 14   Ht 6' (1.829 m)   Wt (!) 402 lb 4 oz (182.5 kg)   SpO2 98%   BMI 54.55 kg/m  General:   Well developed, well nourished . NAD.  HEENT:  Normocephalic . Face symmetric, atraumatic MSK: Slightly TTP on the mid low back.  skin: Not pale. Not jaundice Neurologic:  alert & oriented X3.  Speech normal, gait  and posture slightly antalgic. DTRs symmetric (slightly decreased at both ankles). Straight leg test negative Psych--  Cognition and judgment appear intact.  Cooperative with normal attention span and concentration.  Behavior appropriate. No anxious or depressed appearing.      Assessment & Plan:    Assessment    DM ( was prediabetic since 2012, DM since 01-2017)   Hyperlipidemia Morbid Obesity  Elevated LFTs, chronic, likely fatty liver Hepatitis B and C negative  in 2010  Ultrasound 03-2014 showed fatty liver. Normal ceruloplasmin and alpha-1 antitrypsin 2015  Seasonal allergies and RAD  PLAN DM: A1c 7.3 01/19/2017, he now has diabetes. Since then he has decrease his carbohydrate intake, not drinking sweet tea etc. Has lost a few pounds. Risk of diabetes and morbid obesity discussed including early death. He plans to continue doing his healthier diet. Will check A1c based on results : start Victoza? Marland Kitchen Metformin will be a good choice but LFTs are elevated. Morbid obesity: Making some progress, see above, recto see  one of the bariatric doctors in town for further education and evaluation. Back pain: Acute lumbalgia, will treat with Flexeril, meloxicam. See instructions. Increase LFTs: Check labs. RTC 3 months  Today, I spent more than  80min with the patient: >50% of the time counseling regards Recent diagnosis of diabetes, implications of diagnosis including elevated. Need to diet, exercise, also treating an acute problem.

## 2017-04-17 NOTE — Progress Notes (Signed)
Pre visit review using our clinic review tool, if applicable. No additional management support is needed unless otherwise documented below in the visit note. 

## 2017-04-18 LAB — HEPATIC FUNCTION PANEL
ALK PHOS: 80 U/L (ref 39–117)
ALT: 30 U/L (ref 0–53)
AST: 42 U/L — ABNORMAL HIGH (ref 0–37)
Albumin: 4.3 g/dL (ref 3.5–5.2)
BILIRUBIN DIRECT: 0.1 mg/dL (ref 0.0–0.3)
TOTAL PROTEIN: 7.4 g/dL (ref 6.0–8.3)
Total Bilirubin: 0.9 mg/dL (ref 0.2–1.2)

## 2017-04-18 LAB — HEMOGLOBIN A1C: Hgb A1c MFr Bld: 7 % — ABNORMAL HIGH (ref 4.6–6.5)

## 2017-04-18 NOTE — Assessment & Plan Note (Signed)
DM: A1c 7.3 01/19/2017, he now has diabetes. Since then he has decrease his carbohydrate intake, not drinking sweet tea etc. Has lost a few pounds. Risk of diabetes and morbid obesity discussed including early death. He plans to continue doing his healthier diet. Will check A1c based on results : start Victoza? Marland Kitchen Metformin will be a good choice but LFTs are elevated. Morbid obesity: Making some progress, see above, recto see  one of the bariatric doctors in town for further education and evaluation. Back pain: Acute lumbalgia, will treat with Flexeril, meloxicam. See instructions. Increase LFTs: Check labs. RTC 3 months

## 2017-04-23 ENCOUNTER — Telehealth: Payer: Self-pay

## 2017-04-23 MED ORDER — INSULIN PEN NEEDLE 32G X 6 MM MISC: 1 refills | Status: DC

## 2017-04-23 MED ORDER — LIRAGLUTIDE 18 MG/3ML ~~LOC~~ SOPN: PEN_INJECTOR | SUBCUTANEOUS | 2 refills | Status: DC

## 2017-04-23 NOTE — Telephone Encounter (Signed)
PA initiated via Covermymeds; KEY: [id number]. Received real-time PA approval through 04/22/2020.

## 2017-04-23 NOTE — Addendum Note (Signed)
Addended byDamita Dunnings D on: 04/23/2017 11:08 AM   Modules accepted: Orders

## 2017-04-24 ENCOUNTER — Ambulatory Visit: Payer: BLUE CROSS/BLUE SHIELD | Admitting: Internal Medicine

## 2017-04-26 ENCOUNTER — Telehealth: Payer: Self-pay | Admitting: Internal Medicine

## 2017-04-26 NOTE — Telephone Encounter (Signed)
Nurse visit scheduled for 05/02/2017 to begin Victoza teaching.

## 2017-04-26 NOTE — Telephone Encounter (Signed)
Pt said that the medication victoza is to expensive. He cant afford it. He would like to know if provider would prescribe a medication less expensive?   CB: [phone]

## 2017-04-26 NOTE — Telephone Encounter (Addendum)
Spoke w/ Pt, informed I have Victoza discount coupons to try (per coupon once activated it states pay as little as $25 per fill/ maximum savings up to $100 per fill). Pt is out of town, he will have his mom come by office to pick up coupon, he is to let us know if Victoza is still to expensive w/ discount coupon.

## 2017-04-27 NOTE — Telephone Encounter (Signed)
Please advise 

## 2017-04-27 NOTE — Telephone Encounter (Signed)
Advise patient to call his insurance and see if one of these medications is covered. Tranzeum, Trulicity. If they are not, then will Rx  metformin  was very close LFTs monitoring

## 2017-04-27 NOTE — Telephone Encounter (Signed)
thx

## 2017-04-27 NOTE — Telephone Encounter (Signed)
Pt called back in, discount card was picked up. He said that he is still not able to afford. The medication with out card is 500.00 now it 400.00 w/ card.

## 2017-04-30 NOTE — Telephone Encounter (Signed)
Pt returning your call pt would like for you to call him back this morning if possible.

## 2017-04-30 NOTE — Telephone Encounter (Signed)
LMOM informing Pt to speak w/ his insurance regarding formulary. Instructed to call once he has discussed w/ them or if he has questions.

## 2017-04-30 NOTE — Telephone Encounter (Signed)
Tanzeum discontinued. Please advise.

## 2017-04-30 NOTE — Telephone Encounter (Signed)
Look into  Bydureon or Byetta

## 2017-04-30 NOTE — Telephone Encounter (Addendum)
Relation to VO:HCSP Call back [place]:  Reason for call:  Patient spoke with insurance and they advised to contact PCP and have MD send trulicity Rx to retail,patient states he will not know what the cost will be until Rx is sent, please advise   [facility] [place] [place] [phone] (Phone) [phone] (Fax)

## 2017-05-01 ENCOUNTER — Telehealth: Payer: Self-pay

## 2017-05-01 DIAGNOSIS — E118 Type 2 diabetes mellitus with unspecified complications: Secondary | ICD-10-CM

## 2017-05-01 MED ORDER — METFORMIN HCL 850 MG PO TABS: 850.0000 mg | ORAL_TABLET | Freq: Two times a day (BID) | ORAL | 3 refills | Status: DC

## 2017-05-01 MED ORDER — DULAGLUTIDE 0.75 MG/0.5ML ~~LOC~~ SOAJ: 0.7500 mg | SUBCUTANEOUS | 3 refills | Status: DC

## 2017-05-01 NOTE — Telephone Encounter (Signed)
Rx sent. Hepatic Function Panel ordered. MyChart message sent to Pt.

## 2017-05-01 NOTE — Addendum Note (Signed)
Addended byDamita Dunnings D on: 05/01/2017 10:45 AM   Modules accepted: Orders

## 2017-05-01 NOTE — Telephone Encounter (Signed)
PA initiated via Covermymeds; KEY: [id number]. Received PA approval through 04/15/2020.

## 2017-05-01 NOTE — Telephone Encounter (Signed)
LMOM informing Pt that Trulicity has been sent to pharmacy and that I'm currently working on PA for medication.

## 2017-05-01 NOTE — Telephone Encounter (Signed)
Pt is requesting a call back. Pt said that trulicity is to expensive also. I suggest to the pt to maybe check with pharmacy / insurance to suggest a medication. Pt says that he will call.

## 2017-05-01 NOTE — Telephone Encounter (Signed)
[facility]- will call and inform Pt shortly.

## 2017-05-01 NOTE — Telephone Encounter (Signed)
Pt called, Trulicity also expensive, Metformin was mentioned in last message. Please advise.

## 2017-05-01 NOTE — Telephone Encounter (Signed)
Start TRULICITY 1.22 mg once a week Call w/ CBGs in 3 weeks

## 2017-05-01 NOTE — Telephone Encounter (Signed)
MyChart message sent to Pt informing that PA has been approved, informed that he still has nurse visit scheduled tomorrow for what was to be Victoza teaching, can be used for [employer] teaching if med affordable. Sample in [place].

## 2017-05-01 NOTE — Telephone Encounter (Signed)
Metformin 850 mg: One tablet twice a day #60 and 3 refills The first 10 days, only take half tablet  twice a day to prevent GI side effects Schedule LFTs 3  weeks from now. DX DM

## 2017-05-01 NOTE — Addendum Note (Signed)
Addended byDamita Dunnings D on: 05/01/2017 04:41 PM   Modules accepted: Orders

## 2017-05-02 ENCOUNTER — Ambulatory Visit: Payer: BLUE CROSS/BLUE SHIELD

## 2017-05-18 ENCOUNTER — Other Ambulatory Visit: Payer: Self-pay | Admitting: Internal Medicine

## 2017-05-23 ENCOUNTER — Other Ambulatory Visit (INDEPENDENT_AMBULATORY_CARE_PROVIDER_SITE_OTHER): Payer: BLUE CROSS/BLUE SHIELD

## 2017-05-23 DIAGNOSIS — E118 Type 2 diabetes mellitus with unspecified complications: Secondary | ICD-10-CM

## 2017-05-23 LAB — HEPATIC FUNCTION PANEL
ALBUMIN: 4 g/dL (ref 3.5–5.2)
ALT: 30 U/L (ref 0–53)
AST: 28 U/L (ref 0–37)
Alkaline Phosphatase: 71 U/L (ref 39–117)
Bilirubin, Direct: 0.1 mg/dL (ref 0.0–0.3)
TOTAL PROTEIN: 6.7 g/dL (ref 6.0–8.3)
Total Bilirubin: 0.6 mg/dL (ref 0.2–1.2)

## 2017-07-17 ENCOUNTER — Ambulatory Visit: Payer: BLUE CROSS/BLUE SHIELD | Admitting: Internal Medicine

## 2017-09-13 ENCOUNTER — Other Ambulatory Visit: Payer: Self-pay | Admitting: Internal Medicine

## 2018-03-26 ENCOUNTER — Other Ambulatory Visit (INDEPENDENT_AMBULATORY_CARE_PROVIDER_SITE_OTHER): Payer: Self-pay

## 2018-03-26 ENCOUNTER — Ambulatory Visit (INDEPENDENT_AMBULATORY_CARE_PROVIDER_SITE_OTHER): Payer: Self-pay | Admitting: Internal Medicine

## 2018-03-26 ENCOUNTER — Encounter: Payer: Self-pay | Admitting: Internal Medicine

## 2018-03-26 VITALS — BP 134/78 | HR 96 | Temp 98.1°F | Resp 16 | Ht 72.0 in | Wt >= 6400 oz

## 2018-03-26 DIAGNOSIS — R945 Abnormal results of liver function studies: Secondary | ICD-10-CM

## 2018-03-26 DIAGNOSIS — E118 Type 2 diabetes mellitus with unspecified complications: Secondary | ICD-10-CM

## 2018-03-26 DIAGNOSIS — Z6841 Body Mass Index (BMI) 40.0 and over, adult: Secondary | ICD-10-CM

## 2018-03-26 DIAGNOSIS — R7989 Other specified abnormal findings of blood chemistry: Secondary | ICD-10-CM

## 2018-03-26 LAB — COMPREHENSIVE METABOLIC PANEL
ALT: 34 U/L (ref 0–53)
AST: 32 U/L (ref 0–37)
Albumin: 4.2 g/dL (ref 3.5–5.2)
Alkaline Phosphatase: 73 U/L (ref 39–117)
BILIRUBIN TOTAL: 0.7 mg/dL (ref 0.2–1.2)
BUN: 9 mg/dL (ref 6–23)
CALCIUM: 9.2 mg/dL (ref 8.4–10.5)
CHLORIDE: 97 meq/L (ref 96–112)
CO2: 34 meq/L — AB (ref 19–32)
Creatinine, Ser: 0.77 mg/dL (ref 0.40–1.50)
GFR: 113.95 mL/min (ref >60.00)
Glucose, Bld: 146 mg/dL — ABNORMAL HIGH (ref 70–99)
POTASSIUM: 3.4 meq/L — AB (ref 3.5–5.1)
Sodium: 138 mEq/L (ref 135–145)
Total Protein: 7.4 g/dL (ref 6.0–8.3)

## 2018-03-26 LAB — MICROALBUMIN / CREATININE URINE RATIO
Creatinine,U: 228 mg/dL
Microalb Creat Ratio: 2.3 mg/g (ref 0.0–30.0)
Microalb, Ur: 5.2 mg/dL — ABNORMAL HIGH (ref 0.0–1.9)

## 2018-03-26 LAB — HEMOGLOBIN A1C: HEMOGLOBIN A1C: 7 % — AB (ref 4.6–6.5)

## 2018-03-26 NOTE — Patient Instructions (Signed)
GO TO THE LAB : Get the blood work     GO TO THE FRONT DESK Schedule your next appointment for a   checkup in 3 months   Consider  be seen at the bariatric office at [facility].

## 2018-03-26 NOTE — Progress Notes (Signed)
Pre visit review using our clinic review tool, if applicable. No additional management support is needed unless otherwise documented below in the visit note. 

## 2018-03-26 NOTE — Progress Notes (Signed)
Subjective:    Patient ID: Dwayne Lewis, male    DOB: 11-14-1970, 47 y.o.   MRN: [id number]  DOS:  03/26/2018 Type of visit - description : rov Interval history: Last office visit 04-2017. At the time I recommended Trulicity - Victoza, he was unable to afford it. He tried metformin without apparent side effects but ran out in October 2019.  Here for follow-up.  Wt Readings from Last 3 Encounters:  03/26/18 (!) 400 lb 8 oz (181.7 kg)  04/17/17 (!) 402 lb 4 oz (182.5 kg)  01/19/17 (!) 411 lb 6 oz (186.6 kg)     Review of Systems  Denies chest pain, difficulty breathing or lower extremity edema He does not snore Energy level is okay on and off. No nausea, vomiting, diarrhea  Past Medical History:  Diagnosis Date  . Hyperlipidemia    75), HDL 27, TG 105. LDL goal= <110  . Other abnormal glucose 2012    A1c 6.2%   . Seasonal allergies    RAD with RTIs only    Past Surgical History:  Procedure Laterality Date  . LASIK  2000   Bilaterally  . TONSILLECTOMY      Social History   Socioeconomic History  . Marital status: Single    Spouse name: Not on file  . Number of children: 0  . Years of education: Not on file  . Highest education level: Not on file  Occupational History  . 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 Financial resource strain: Not on file  . Food insecurity:    Worry: Not on file    Inability: Not on file  . Transportation needs:    Medical: Not on file    Non-medical: Not on file  Tobacco Use  . Smoking status: Never Smoker  . Smokeless tobacco: Never Used  Substance and Sexual Activity  . Alcohol use: Yes    Alcohol/week: 0.0 oz    Comment: socially   . Drug use: No  . Sexual activity: Not on file  Lifestyle  . Physical activity:    Days per week: Not on file    Minutes per session: Not on file  . Stress: Not on file  Relationships  . Social connections:    Talks on phone: Not on file    Gets together: Not on  file    Attends religious service: Not on file    Active member of club or organization: Not on file    Attends meetings of clubs or organizations: Not on file    Relationship status: Not on file  . Intimate partner violence:    Fear of current or ex partner: Not on file    Emotionally abused: Not on file    Physically abused: Not on file    Forced sexual activity: Not on file  Other Topics Concern  . Not on file  Social History Narrative   Lives w/ mother to help her out (mother is [facility])      Allergies as of 03/26/2018      Reactions   Penicillins    Rash   Sulfonamide Derivatives    REACTION: rash      Medication List    as of 03/26/2018 11:59 PM   You have not been prescribed any medications.        Objective:   Physical Exam BP 134/78 (BP Location: Right Wrist, Patient Position: Sitting, Cuff Size: Normal)  Pulse 96   Temp 98.1 F (36.7 C) (Oral)   Resp 16   Ht 6' (1.829 m)   Wt (!) 400 lb 8 oz (181.7 kg)   SpO2 94%   BMI 54.32 kg/m  General:   Well developed, NAD, see BMI.  HEENT:  Normocephalic . Face symmetric, atraumatic Lungs:  CTA B Normal respiratory effort, no intercostal retractions, no accessory muscle use. Heart: RRR,  no murmur.  No pretibial edema bilaterally  Skin: Not pale. Not jaundice DIABETIC FEET EXAM: No lower extremity edema Skin normal, nails normal, no calluses Pinprick examination of the feet normal. Neurologic:  alert & oriented X3.  Speech normal, gait appropriate for age and unassisted Psych--  Cognition and judgment appear intact.  Cooperative with normal attention span and concentration.  Behavior appropriate. No anxious or depressed appearing.      Assessment & Plan:   Assessment    DM ( was prediabetic since 2012, DM since 01-2017)   Hyperlipidemia Morbid Obesity  Elevated LFTs, chronic, likely fatty liver Hepatitis B and C negative in 2010  Ultrasound 03-2014 showed fatty liver. Normal  ceruloplasmin and alpha-1 antitrypsin 2015  Seasonal allergies and RAD  PLAN DM: Last A1c 7.0, unable to afford Victoza, Trulicity.  Eventually tried metformin with no side effects but lost follow-up. Plan: CMP, A1c, micro.  Restart metformin with results. Morbid obesity: BMI 54, I made him aware again of all the consequences of morbid obesity including DJD, respiratory and cardiovascular problems.  Encouraged to work on his diet, calorie counting would be a great thing.  Also gave him information about the bariatric office at [facility] health. Exercise recommended although I am somewhat concerned about his knees. Increased LFTs: No EtOH, he does take 2 Tylenols at night RTC 3 months

## 2018-03-27 MED ORDER — METFORMIN HCL 850 MG PO TABS: 850.0000 mg | ORAL_TABLET | Freq: Two times a day (BID) | ORAL | 3 refills | Status: DC

## 2018-03-27 NOTE — Assessment & Plan Note (Signed)
DM: Last A1c 7.0, unable to afford Victoza, Trulicity.  Eventually tried metformin with no side effects but lost follow-up. Plan: CMP, A1c, micro.  Restart metformin with results. Morbid obesity: BMI 54, I made him aware again of all the consequences of morbid obesity including DJD, respiratory and cardiovascular problems.  Encouraged to work on his diet, calorie counting would be a great thing.  Also gave him information about the bariatric office at [facility] health. Exercise recommended although I am somewhat concerned about his knees. Increased LFTs: No EtOH, he does take 2 Tylenols at night RTC 3 months

## 2018-06-26 ENCOUNTER — Ambulatory Visit: Payer: Self-pay | Admitting: Internal Medicine

## 2018-08-31 ENCOUNTER — Emergency Department
Admission: EM | Admit: 2018-08-31 | Discharge: 2018-08-31 | Disposition: A | Discharge status: Home / Self Care | Payer: Self-pay | Source: Home / Self Care | Attending: Family Medicine | Admitting: Family Medicine

## 2018-08-31 ENCOUNTER — Other Ambulatory Visit: Payer: Self-pay

## 2018-08-31 DIAGNOSIS — R0981 Nasal congestion: Secondary | ICD-10-CM

## 2018-08-31 DIAGNOSIS — J3489 Other specified disorders of nose and nasal sinuses: Secondary | ICD-10-CM

## 2018-08-31 MED ORDER — GUAIFENESIN 100 MG/5ML PO SOLN: 10.0000 mL | ORAL | 0 refills | Status: DC | PRN

## 2018-08-31 MED ORDER — AZITHROMYCIN 250 MG PO TABS: 250.0000 mg | ORAL_TABLET | Freq: Every day | ORAL | 0 refills | Status: DC

## 2018-08-31 NOTE — Discharge Instructions (Signed)
°  Your symptoms appear to be due to a viral illness. Antibiotics would not be of benefit at this time. It is recommended you treat your symptoms with over the counter guaifenesin for any cough/congestion, nasal saline mist or rinses or humidifiers,Tylenol or Motrin for pain/fever.  Due to the upcoming holidays a prescription to hold for azithromycin (antibiotic) has been provided.   If you develop worsening facial pain, persistent fever >100.4*F for 3 days, or not improving in 5-7 days, you may start the antibiotic. Please follow up with family medicine in 1 week if not improving.

## 2018-08-31 NOTE — ED Triage Notes (Signed)
Pt c/o nasal congestion and sinus pressure since Fri morning. Took some Claritin today that helped some. Also taking tylenol and motrin prn.

## 2018-08-31 NOTE — ED Provider Notes (Signed)
[facility]    CSN: [id number] Arrival date & time: 08/31/18  1508     History   Chief Complaint Chief Complaint  Patient presents with  . Facial Pain  . Nasal Congestion    HPI Dwayne Lewis is a 47 y.o. male.   HPI Dwayne Lewis is a 47 y.o. male presenting to UC with c/o  Nasal congestion and sinus pressure for 3  Days, worsening. He took Claritin today with some relief.  He is also alternating Tylenol and Motrin. Denies fever, chills, n/v/d. Hx of sinus infections in the past, he knows he needs to f/u with ENT.    BP elevated, pt states its due to feeling sick and "white coat syndrome."  Denies dizziness, change in vision, chest pain or palpitations.   Past Medical History:  Diagnosis Date  . Hyperlipidemia    75), HDL 27, TG 105. LDL goal= <110  . Other abnormal glucose 2012    A1c 6.2%   . Seasonal allergies    RAD with RTIs only    Patient Active Problem List   Diagnosis Date Noted  . PCP NOTES >>> 07/06/2015  . Elevated LFTs 03/28/2014  . Annual physical exam 03/26/2014  . Morbid obesity with BMI of 50.0-59.9, adult ([place]) 01/02/2014  . Nonspecific abnormal electrocardiogram (ECG) (EKG) 01/10/2012  . HYPERLIPIDEMIA 11/14/2010  . ALLERGIC RHINITIS 11/14/2010  . Diabetes mellitus, type II ([place]) 10/19/2010  . ELEVATED BLOOD PRESSURE WITHOUT DIAGNOSIS OF HYPERTENSION 10/13/2010    Past Surgical History:  Procedure Laterality Date  . LASIK  2000   Bilaterally  . TONSILLECTOMY         Home Medications    Prior to Admission medications   Medication Sig Start Date End Date Taking? Authorizing Provider  azithromycin (ZITHROMAX) 250 MG tablet Take 1 tablet (250 mg total) by mouth daily. Take first 2 tablets together, then 1 every day until finished. 08/31/18   Noe Gens, PA-C  guaiFENesin (ROBITUSSIN) 100 MG/5ML SOLN Take 10-20 mLs (200-400 mg total) by mouth every 4 (four) hours as needed for cough or to loosen phlegm. 08/31/18    Noe Gens, PA-C  metFORMIN (GLUCOPHAGE) 850 MG tablet Take 1 tablet (850 mg total) by mouth 2 (two) times daily with a meal. 03/27/18   Colon Branch, MD    Family History Family History  Problem Relation Age of Onset  . Hypertension Mother   . Transient ischemic attack Maternal Grandfather   . Lung cancer Maternal Grandmother   . COPD Maternal Grandmother        uncle   . Diabetes Other        MG aunt  . Colon cancer Neg Hx   . Prostate cancer Neg Hx   . CAD Neg Hx     Social History Social History   Tobacco Use  . Smoking status: Never Smoker  . Smokeless tobacco: Never Used  Substance Use Topics  . Alcohol use: Yes    Alcohol/week: 0.0 standard drinks    Comment: socially   . Drug use: No     Allergies   Penicillins and Sulfonamide derivatives   Review of Systems Review of Systems  Constitutional: Negative for chills and fever.  HENT: Positive for congestion, ear pain (pressure), postnasal drip, sinus pressure, sinus pain and sore throat. Negative for trouble swallowing and voice change.   Respiratory: Positive for cough. Negative for shortness of breath.   Cardiovascular: Negative for chest pain and  palpitations.  Gastrointestinal: Negative for abdominal pain, diarrhea, nausea and vomiting.  Musculoskeletal: Negative for arthralgias, back pain and myalgias.  Skin: Negative for rash.  Neurological: Positive for headaches. Negative for dizziness and light-headedness.     Physical Exam Triage Vital Signs ED Triage Vitals  Enc Vitals Group     BP 08/31/18 1554 (!) 173/115     Pulse Rate 08/31/18 1554 (!) 108     Resp --      Temp 08/31/18 1554 98.1 F (36.7 C)     Temp Source 08/31/18 1554 Oral     SpO2 08/31/18 1554 96 %     Weight 08/31/18 1555 (!) 407 lb (184.6 kg)     Height 08/31/18 1555 5\' 11"  (1.803 m)     Head Circumference --      Peak Flow --      Pain Score 08/31/18 1555 0     Pain Loc --      Pain Edu? --      Excl. in [place]? --    No  data found.  Updated Vital Signs BP (!) 173/115 (BP Location: Right Arm)   Pulse (!) 108   Temp 98.1 F (36.7 C) (Oral)   Ht 5\' 11"  (1.803 m)   Wt (!) 407 lb (184.6 kg)   SpO2 96%   BMI 56.76 kg/m   Visual Acuity Right Eye Distance:   Left Eye Distance:   Bilateral Distance:    Right Eye Near:   Left Eye Near:    Bilateral Near:     Physical Exam Vitals signs and nursing note reviewed.  Constitutional:      Appearance: Normal appearance. He is well-developed.  HENT:     Head: Normocephalic and atraumatic.     Right Ear: Tympanic membrane normal.     Left Ear: Tympanic membrane normal.     Nose: Nose normal.     Right Sinus: No maxillary sinus tenderness or frontal sinus tenderness.     Left Sinus: No maxillary sinus tenderness or frontal sinus tenderness.     Mouth/Throat:     Lips: Pink.     Pharynx: Oropharynx is clear. Uvula midline. No pharyngeal swelling, oropharyngeal exudate, posterior oropharyngeal erythema or uvula swelling.  Neck:     Musculoskeletal: Normal range of motion.  Cardiovascular:     Rate and Rhythm: Regular rhythm. Tachycardia present.  Pulmonary:     Effort: Pulmonary effort is normal. No respiratory distress.     Breath sounds: Normal breath sounds. No stridor. No wheezing or rhonchi.  Musculoskeletal: Normal range of motion.  Skin:    General: Skin is warm and dry.  Neurological:     Mental Status: He is alert and oriented to person, place, and time.  Psychiatric:        Behavior: Behavior normal.      UC Treatments / Results  Labs (all labs ordered are listed, but only abnormal results are displayed) Labs Reviewed - No data to display  EKG None  Radiology No results found.  Procedures Procedures (including critical care time)  Medications Ordered in UC Medications - No data to display  Initial Impression / Assessment and Plan / UC Course  I have reviewed the triage vital signs and the nursing notes.  Pertinent labs &  imaging results that were available during my care of the patient were reviewed by me and considered in my medical decision making (see chart for details).     Hx and exam  c/w viral illness vs allergic sinusitis/sinus HA Encouraged symptomatic treatment. Prescription to hold provided for azithromycin in case symptoms persist/worsen  Final Clinical Impressions(s) / UC Diagnoses   Final diagnoses:  Nasal congestion  Sinus pressure     Discharge Instructions      Your symptoms appear to be due to a viral illness. Antibiotics would not be of benefit at this time. It is recommended you treat your symptoms with over the counter guaifenesin for any cough/congestion, nasal saline mist or rinses or humidifiers,Tylenol or Motrin for pain/fever.  Due to the upcoming holidays a prescription to hold for azithromycin (antibiotic) has been provided.   If you develop worsening facial pain, persistent fever >100.4*F for 3 days, or not improving in 5-7 days, you may start the antibiotic. Please follow up with family medicine in 1 week if not improving.     ED Prescriptions    Medication Sig Dispense Auth. Provider   guaiFENesin (ROBITUSSIN) 100 MG/5ML SOLN Take 10-20 mLs (200-400 mg total) by mouth every 4 (four) hours as needed for cough or to loosen phlegm. 473 mL Gerarda Fraction, Brenton Joines O, PA-C   azithromycin (ZITHROMAX) 250 MG tablet Take 1 tablet (250 mg total) by mouth daily. Take first 2 tablets together, then 1 every day until finished. 6 tablet Noe Gens, PA-C     Controlled Substance Prescriptions [place] Controlled Substance Registry consulted? Not Applicable   Tyrell Antonio 09/01/18 1214

## 2018-09-11 DIAGNOSIS — R634 Abnormal weight loss: Secondary | ICD-10-CM

## 2018-09-11 HISTORY — DX: Abnormal weight loss: R63.4

## 2018-09-11 HISTORY — PX: UPPER GASTROINTESTINAL ENDOSCOPY: SHX188

## 2018-09-23 ENCOUNTER — Ambulatory Visit: Payer: Self-pay | Admitting: Family Medicine

## 2018-09-23 ENCOUNTER — Encounter: Payer: Self-pay | Admitting: Family Medicine

## 2018-09-23 ENCOUNTER — Other Ambulatory Visit: Payer: Self-pay

## 2018-09-23 VITALS — BP 138/82 | HR 96 | Temp 98.2°F | Resp 20 | Ht 71.0 in | Wt >= 6400 oz

## 2018-09-23 DIAGNOSIS — R221 Localized swelling, mass and lump, neck: Secondary | ICD-10-CM

## 2018-09-23 DIAGNOSIS — R590 Localized enlarged lymph nodes: Secondary | ICD-10-CM

## 2018-09-23 LAB — BASIC METABOLIC PANEL
BUN: 9 mg/dL (ref 6–23)
CALCIUM: 9.2 mg/dL (ref 8.4–10.5)
CO2: 33 mEq/L — ABNORMAL HIGH (ref 19–32)
Chloride: 96 mEq/L (ref 96–112)
Creatinine, Ser: 0.77 mg/dL (ref 0.40–1.50)
GFR: 113.71 mL/min (ref >60.00)
GLUCOSE: 161 mg/dL — AB (ref 70–99)
Potassium: 3.7 mEq/L (ref 3.5–5.1)
Sodium: 136 mEq/L (ref 135–145)

## 2018-09-23 MED ORDER — CLARITHROMYCIN 500 MG PO TABS: 500.0000 mg | ORAL_TABLET | Freq: Two times a day (BID) | ORAL | 0 refills | Status: DC

## 2018-09-23 NOTE — Progress Notes (Addendum)
[facility] [address] [place], [place] [address] [phone]  Date:  09/23/2018   Name:  Dwayne Lewis   DOB:  February 27, 1971   MRN:  [id number]  PCP:  Colon Branch, MD    Chief Complaint: Adenopathy (right swollen gland showed up this morning,  trouble swallowing, coughing, ear pain, trouble breathing)   History of Present Illness:  Dwayne Lewis is a 48 y.o. very pleasant male patient who presents with the following:  Patient of Dr. Larose Kells with history of obesity, hyperlipidemia, diabetes.  Here today with concern of difficulty swallowing/feeling of a painful knot in his right sided neck Seen by UC on 12/21-  Thought to have a viral URI but was given an rx for azithromycin to hold and he did end up taking this a couple of days later.  He is also using his cough syrup as needed  This am he awoke with a right sided neck pain-he first noticed this around 2 AM. Dwayne Lewis notes that he is able to swallow liquids fine, but the thought of swallowing solids is daunting so he has not really tried it.  However, he does not feel that he has a mechanical reason why he cannot swallow solids. He is able to breathe fine, unless he turns his neck a certain direction while laying down. He also notes that his right sided lower teeth hurt today, he has not noticed any teeth pain previously. Lab Results  Component Value Date   HGBA1C 7.0 (H) 03/26/2018   He did take tylenol last night around 8pm- nothing since then   He has not measured a fever. It hurts him to swallow, but he does not feel like there is a sore throat per se.  It does not feel like strep throat he may have had in the past.  He had his tonsils out as a child due to frequent sore throat.  He is not currently taking any medication for his diabetes No vomiting   Patient Active Problem List   Diagnosis Date Noted  . PCP NOTES >>> 07/06/2015  . Elevated LFTs 03/28/2014   . Annual physical exam 03/26/2014  . Morbid obesity with BMI of 50.0-59.9, adult ([place]) 01/02/2014  . Nonspecific abnormal electrocardiogram (ECG) (EKG) 01/10/2012  . HYPERLIPIDEMIA 11/14/2010  . ALLERGIC RHINITIS 11/14/2010  . Diabetes mellitus, type II ([place]) 10/19/2010  . ELEVATED BLOOD PRESSURE WITHOUT DIAGNOSIS OF HYPERTENSION 10/13/2010    Past Medical History:  Diagnosis Date  . Hyperlipidemia    75), HDL 27, TG 105. LDL goal= <110  . Other abnormal glucose 2012    A1c 6.2%   . Seasonal allergies    RAD with RTIs only    Past Surgical History:  Procedure Laterality Date  . LASIK  2000   Bilaterally  . TONSILLECTOMY      Social History   Tobacco Use  . Smoking status: Never Smoker  . Smokeless tobacco: Never Used  Substance Use Topics  . Alcohol use: Yes    Alcohol/week: 0.0 standard drinks    Comment: socially   . Drug use: No    Family History  Problem Relation Age of Onset  . Hypertension Mother   . Transient ischemic attack Maternal Grandfather   . Lung cancer Maternal Grandmother   . COPD Maternal Grandmother        uncle   . Diabetes Other  MG aunt  . Colon cancer Neg Hx   . Prostate cancer Neg Hx   . CAD Neg Hx     Allergies  Allergen Reactions  . Penicillins     Rash   . Sulfonamide Derivatives     REACTION: rash     Medication list has been reviewed and updated.  No current outpatient medications on file prior to visit.   No current facility-administered medications on file prior to visit.     Review of Systems:  As per HPI- otherwise negative. S/p tonsillectomy age 22    Physical Examination: Vitals:   09/23/18 1057 09/23/18 1136  BP: 138/82   Pulse: (!) 103 96  Resp: (!) 22 20  Temp: 98.2 F (36.8 C)   SpO2: 94%    Vitals:   09/23/18 1057  Weight: (!) 404 lb (183.3 kg)  Height: 5\' 11"  (1.803 m)   Body mass index is 56.35 kg/m. Ideal Body Weight: Weight in (lb) to have BMI = 25: 178.9  GEN: WDWN, NAD,  Non-toxic, A & O x 3, quite obese, oherrwise looks well  HEENT: Atraumatic, Normocephalic. Neck supple. No masses. Bilateral TM wnl, oropharynx normal.  PEERL,EOMI. oropharynx exam is challenging given patient's size, but I am able to visualize his posterior oropharynx and there is no sign of a peritonsillar abscess.  Uvula is midline.  The oropharynx is not injected, and there is no exudate. Patient notes tenderness in the right anterior cervical node region.  Again, exam is limited due to his body habitus.  I am not able to palpate any definite abnormality in this area.  I do not note any gross swelling or deformity. Normal range of motion of his neck Oral exam does not reveal any salivary gland swelling or tenderness, and the teeth are not tender to pressure I do not see any obvious dental abscess Ears and Nose: No external deformity. CV: RRR, No M/G/R. No JVD. No thrill. No extra heart sounds. PULM: CTA B, no wheezes, crackles, rhonchi. No retractions. No resp. distress. No accessory muscle use. EXTR: No c/c/e NEURO Normal gait.  PSYCH: Normally interactive. Conversant. Not depressed or anxious appearing.  Calm demeanor.   Pulse Readings from Last 3 Encounters:  09/23/18 96  08/31/18 (!) 108  03/26/18 96    Assessment and Plan: Cervical lymphadenopathy - Plan: clarithromycin (BIAXIN) 500 MG tablet  Localized swelling, mass or lump of neck - Plan: CT Soft Tissue Neck W Contrast, Basic metabolic panel, clarithromycin (BIAXIN) 500 MG tablet, CANCELED: Basic metabolic panel  Dwayne Lewis is here today with complaint of right-sided neck pain.  I explained to him that differential diagnosis includes more benign causes such as lymphadenopathy, as well as something more serious such as an abscess.  I have recommended that we do a CT of his neck today.  We called [facility] and set up an appointment for same, but Dwayne Lewis then decided not to pursue a CT today due to cost.  He knows that he does  not have to pay upfront today, he can make payments.  I have explained to him that it is possible he has a serious condition, and that by deferring complete evaluation we may allow it to get worse.  It is even possible that this could result in his death.  Dwayne Lewis states understanding, and declinig the CT is certainly his decision. We decided together to treat him with Biaxin 500 twice daily.  He will drink plenty of fluids, and closely monitor  his condition.  He is asked to seek emergency care if he has any worsening or distress.  Otherwise, he will contact me if he does decide to pursue the CT. I will go ahead and check a BMP today, so we can be prepared in case he does wish to do the CT.  This will also also check his blood sugar  Signed Dwayne Blinks, MD  Received BMP- message to pt   Results for orders placed or performed in visit on 70/35/00  Basic metabolic panel  Result Value Ref Range   Sodium 136 135 - 145 mEq/L   Potassium 3.7 3.5 - 5.1 mEq/L   Chloride 96 96 - 112 mEq/L   CO2 33 (H) 19 - 32 mEq/L   Glucose, Bld 161 (H) 70 - 99 mg/dL   BUN 9 6 - 23 mg/dL   Creatinine, Ser 0.77 0.40 - 1.50 mg/dL   Calcium 9.2 8.4 - 10.5 mg/dL   GFR 113.71 >60.00 mL/min

## 2018-09-23 NOTE — Patient Instructions (Signed)
The cause of your neck pain is not entirely clear today.  This may be as simple as a lymph node, or could represent a more serious infection.  At this time you have elected not to do a CT scan.  We are going to treat with clarithromycin antibiotic, take it twice a day for 10 days.  Please drink plenty of fluids, and watch your condition carefully.  If you are getting worse, having difficulty swallowing, or breathing, please go to the emergency department.  Otherwise, if your symptoms are just not getting better we can do the CT in the future.  Just let me know if you want me to order this for you

## 2018-09-25 ENCOUNTER — Ambulatory Visit: Payer: Self-pay | Admitting: Internal Medicine

## 2018-09-25 NOTE — Telephone Encounter (Signed)
Called patient.  He notes that the doxycycline is giving him diarrhea and also nausea.  However, the lymphadenopathy in his neck has completely resolved.  This means it was likely viral, as he is only been on antibiotics about 48 hours.  Since he is having so many side effects I asked him to discontinue the doxycycline.  If the swelling does come back, he will restart Doxy and call me.  We will then try to find a different option for him.  It is a bit more difficult as he is allergic to penicillins and also sulfa

## 2018-09-26 ENCOUNTER — Encounter: Payer: Self-pay | Admitting: Family Medicine

## 2018-09-26 ENCOUNTER — Ambulatory Visit (INDEPENDENT_AMBULATORY_CARE_PROVIDER_SITE_OTHER): Payer: Self-pay | Admitting: Family Medicine

## 2018-09-26 VITALS — BP 136/82 | HR 128 | Temp 99.5°F | Ht 71.0 in | Wt 399.1 lb

## 2018-09-26 DIAGNOSIS — R590 Localized enlarged lymph nodes: Secondary | ICD-10-CM

## 2018-09-26 DIAGNOSIS — H6983 Other specified disorders of Eustachian tube, bilateral: Secondary | ICD-10-CM

## 2018-09-26 MED ORDER — CEFDINIR 300 MG PO CAPS: 300.0000 mg | ORAL_CAPSULE | Freq: Two times a day (BID) | ORAL | 0 refills | Status: DC

## 2018-09-26 MED ORDER — PREDNISONE 50 MG PO TABS: 50.0000 mg | ORAL_TABLET | Freq: Every day | ORAL | 0 refills | Status: DC

## 2018-09-26 NOTE — Progress Notes (Signed)
Pre visit review using our clinic review tool, if applicable. No additional management support is needed unless otherwise documented below in the visit note. 

## 2018-09-26 NOTE — Progress Notes (Signed)
Chief Complaint  Patient presents with  . Follow-up    ear pain    Dwayne Lewis here for URI complaints.  Duration: 3 days   Associated symptoms: ear fullness, wheezing, shortness of breath and cough Denies: sinus congestion, sinus pain, rhinorrhea, itchy watery eyes, ear drainage, sore throat and fevers Treatment to date: Clarythromycin helped but he had AE's from it Sick contacts: No  ROS:  Const: Denies fevers HEENT: As noted in HPI Lungs: No SOB  Past Medical History:  Diagnosis Date  . Hyperlipidemia    73), HDL 27, TG 105. LDL goal= <110  . Other abnormal glucose 2012    A1c 6.2%   . Seasonal allergies    RAD with RTIs only    BP 136/82 (BP Location: Left Arm, Patient Position: Sitting, Cuff Size: Large)   Pulse (!) 128   Temp 99.5 F (37.5 C) (Oral)   Ht 5\' 11"  (1.803 m)   Wt (!) 399 lb 2 oz (181 kg)   SpO2 97%   BMI 55.67 kg/m  General: Awake, alert, appears stated age HEENT: AT, [place], ears patent b/l and TM's neg, nares patent w/o discharge, pharynx pink and without exudates, MMM Neck: No masses or asymmetry Heart: RRR Lungs: CTAB, no accessory muscle use Psych: Age appropriate judgment and insight, normal mood and affect  Lymphadenopathy of head and neck region - Plan: cefdinir (OMNICEF) 300 MG capsule  Dysfunction of both eustachian tubes - Plan: predniSONE (DELTASONE) 50 MG tablet  Cephalosporin as he has been on this before despite listed PCN allergy. I did emphasize if this improves with tx, we don't necessarily need to pursue the CT scan at that time, but if it does not get better, we would need to revisit this.  Pred for ETD.  Continue to push fluids, practice good hand hygiene, cover mouth when coughing. F/u next week w reg PCP if no improvement. Pt voiced understanding and agreement to the plan.  [place], DO 09/26/18 11:21 AM

## 2018-09-26 NOTE — Patient Instructions (Addendum)
If we do not turn the corner over hte next few days, I really want you to consider the CT scan that Dr. Lorelei Pont discussed with you. If things get worse or you cannot breath/consume anything orally, go to the ER.  I expect improvement over the next 24-48 hours.  Schedule appt with Dr. Larose Kells for follow up next week. Cancel if you are doing better though.   Let us know if you need anything.

## 2018-09-30 ENCOUNTER — Ambulatory Visit (HOSPITAL_BASED_OUTPATIENT_CLINIC_OR_DEPARTMENT_OTHER): Admission: RE | Admit: 2018-09-30 | Payer: Self-pay | Source: Ambulatory Visit

## 2018-09-30 ENCOUNTER — Encounter (HOSPITAL_BASED_OUTPATIENT_CLINIC_OR_DEPARTMENT_OTHER): Payer: Self-pay

## 2018-09-30 ENCOUNTER — Ambulatory Visit (HOSPITAL_BASED_OUTPATIENT_CLINIC_OR_DEPARTMENT_OTHER)
Admission: RE | Admit: 2018-09-30 | Discharge: 2018-09-30 | Disposition: A | Discharge status: Home / Self Care | Payer: Self-pay | Source: Ambulatory Visit | Attending: Internal Medicine | Admitting: Internal Medicine

## 2018-09-30 ENCOUNTER — Ambulatory Visit (INDEPENDENT_AMBULATORY_CARE_PROVIDER_SITE_OTHER): Payer: Self-pay | Admitting: Internal Medicine

## 2018-09-30 ENCOUNTER — Encounter: Payer: Self-pay | Admitting: Internal Medicine

## 2018-09-30 VITALS — BP 142/80 | HR 101 | Temp 98.8°F | Resp 16 | Ht 71.0 in | Wt 392.0 lb

## 2018-09-30 DIAGNOSIS — G510 Bell's palsy: Secondary | ICD-10-CM

## 2018-09-30 MED ORDER — VALACYCLOVIR HCL 1 G PO TABS: 1000.0000 mg | ORAL_TABLET | Freq: Three times a day (TID) | ORAL | 0 refills | Status: DC

## 2018-09-30 MED ORDER — IOPAMIDOL (ISOVUE-300) INJECTION 61%
100.0000 mL | Freq: Once | INTRAVENOUS | Status: AC | PRN
  Administered 2018-09-30: 100 mL via INTRAVENOUS

## 2018-09-30 MED ORDER — PREDNISONE 20 MG PO TABS: 20.0000 mg | ORAL_TABLET | Freq: Every day | ORAL | 0 refills | Status: DC

## 2018-09-30 NOTE — Patient Instructions (Addendum)
GO TO THE LAB : Get the blood work     GO TO THE FRONT DESK Schedule your next appointment   2 weeks from today   STOP BY THE FIRST FLOOR:  get the CTs  Start taking Valtrex for 1 week  Take prednisone for few days  ER if severe symptoms  Use   artificial tears daily.  See your eye doctor ASAP  ER if: Rash, fever, increased difficulty swallowing.  Worsening weakness of your face.

## 2018-09-30 NOTE — Progress Notes (Signed)
Pre visit review using our clinic review tool, if applicable. No additional management support is needed unless otherwise documented below in the visit note. 

## 2018-09-30 NOTE — Progress Notes (Signed)
Subjective:    Patient ID: Dwayne Lewis, male    DOB: 1971-04-23, 48 y.o.   MRN: [id number]  DOS:  09/30/2018 Type of visit - description:  Seen urgent care 08/31/2018  with a URI.  Prescribed Zithromax  Seen at this office 09/23/2018, at the time he had difficulty swallowing and a painful knot on the right side of the neck.  Was recommended a CT neck but did not pursue due to cost, eventually was prescribed Biaxin.  Seen again 09/26/2018 in this office by another provider.  Biaxin was discontinued due to side effects and he was prescribed omnicef and prednisone.  He is here because he is not completely better. After around of prednisone & antibiotics he felt temporarily improve But now for the last few days has developed difficulty swallowing solids only, feels like the food getting stuck in the throat.  No problems with liquids.  The lump at the right neck is gone -per pt-  but now has one on the left  When I was doing my exam, I noticed mild left-sided facial weakness and he reports that when he drinks fluids, has noticed some dripping of fluids from the left corner of his mouth.   Review of Systems Denies any fever except yesterday, temperature was 100. No sore throat.  No dental pain. Minimal sinus congestion with occasional clear discharge No ear pain or rash of the ear, neck or face. No nausea or vomiting, had diarrhea described as loose stools for the last 3 days. No GERD symptoms. Denies tinnitus  Past Medical History:  Diagnosis Date  . Diabetes mellitus without complication ([place])   . Hyperlipidemia    75), HDL 27, TG 105. LDL goal= <110  . Other abnormal glucose 2012    A1c 6.2%   . Seasonal allergies    RAD with RTIs only    Past Surgical History:  Procedure Laterality Date  . LASIK  2000   Bilaterally  . TONSILLECTOMY      Social History   Socioeconomic History  . Marital status: Single    Spouse name: Not on file  . Number of children: 0  . Years  of education: Not on file  . Highest education level: Not on file  Occupational History  . 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 Financial resource strain: Not on file  . Food insecurity:    Worry: Not on file    Inability: Not on file  . Transportation needs:    Medical: Not on file    Non-medical: Not on file  Tobacco Use  . Smoking status: Never Smoker  . Smokeless tobacco: Never Used  Substance and Sexual Activity  . Alcohol use: Yes    Alcohol/week: 0.0 standard drinks    Comment: socially   . Drug use: No  . Sexual activity: Not on file  Lifestyle  . Physical activity:    Days per week: Not on file    Minutes per session: Not on file  . Stress: Not on file  Relationships  . Social connections:    Talks on phone: Not on file    Gets together: Not on file    Attends religious service: Not on file    Active member of club or organization: Not on file    Attends meetings of clubs or organizations: Not on file    Relationship status: Not on file  . Intimate partner violence:    Fear  of current or ex partner: Not on file    Emotionally abused: Not on file    Physically abused: Not on file    Forced sexual activity: Not on file  Other Topics Concern  . Not on file  Social History Narrative   Lives w/ mother to help her out (mother is [facility])      Allergies as of 09/30/2018      Reactions   Penicillins    Rash   Sulfonamide Derivatives    REACTION: rash      Medication List       Accurate as of September 30, 2018 11:59 PM. Always use your most recent med list.        predniSONE 20 MG tablet Commonly known as:  DELTASONE Take 1 tablet (20 mg total) by mouth daily with breakfast.   valACYclovir 1000 MG tablet Commonly known as:  VALTREX Take 1 tablet (1,000 mg total) by mouth 3 (three) times daily.           Objective:   Physical Exam HENT:     Head:     BP (!) 142/80 (BP Location: Left Arm, Patient Position: Sitting,  Cuff Size: Normal)   Pulse (!) 101   Temp 98.8 F (37.1 C) (Oral)   Resp 16   Ht 5\' 11"  (1.803 m)   Wt (!) 392 lb (177.8 kg)   SpO2 95%   BMI 54.67 kg/m  General:   Well developed, NAD, BMI noted. HEENT:  Normocephalic . Face symmetric, atraumatic. EOMI, pupils equal and reactive. TMs normal.  Canals normal bilaterally with no blisters or lesions. Throat: Symmetric. Gum  palpated, no evidence of abscess. No drooling.  No stridor Neck: No thyromegaly, supraclavicular areas normal. See graphic Lymphatic system: No lymphadenopathies at the axillary areas Lungs:  CTA B Normal respiratory effort, no intercostal retractions, no accessory muscle use. Heart: RRR,  no murmur.  No pretibial edema bilaterally  Skin: Not pale. Not jaundice Neurologic:  alert & oriented X3.  Speech normal, gait appropriate for age and unassisted Very subtle left-sided facial paresis, including the muscles of the forehead and the corner of the mouth.  Decreased blinking of the left eye. Motor exam is otherwise unremarkable. Psych--  Cognition and judgment appear intact.  Cooperative with normal attention span and concentration.  Behavior appropriate. Patient seems emotional and anxious, reports he is upset because he cannot eat     Assessment   Assessment    DM ( was prediabetic since 2012, DM since 01-2017)   Hyperlipidemia Morbid Obesity  Elevated LFTs, chronic, likely fatty liver Hepatitis B and C negative in 2010  Ultrasound 03-2014 showed fatty liver. Normal ceruloplasmin and alpha-1 antitrypsin 2015  Seasonal allergies and RAD  PLAN  Time spent with patient more than 35 minutes due to careful review of previous office visits

## 2018-10-01 ENCOUNTER — Encounter (HOSPITAL_BASED_OUTPATIENT_CLINIC_OR_DEPARTMENT_OTHER): Payer: Self-pay | Admitting: *Deleted

## 2018-10-01 ENCOUNTER — Emergency Department (HOSPITAL_BASED_OUTPATIENT_CLINIC_OR_DEPARTMENT_OTHER): Payer: Self-pay

## 2018-10-01 ENCOUNTER — Emergency Department (HOSPITAL_BASED_OUTPATIENT_CLINIC_OR_DEPARTMENT_OTHER)
Admission: EM | Admit: 2018-10-01 | Discharge: 2018-10-01 | Disposition: A | Discharge status: Home / Self Care | Payer: Self-pay | Attending: Emergency Medicine | Admitting: Emergency Medicine

## 2018-10-01 ENCOUNTER — Other Ambulatory Visit: Payer: Self-pay

## 2018-10-01 DIAGNOSIS — R0602 Shortness of breath: Secondary | ICD-10-CM | POA: Insufficient documentation

## 2018-10-01 DIAGNOSIS — E876 Hypokalemia: Secondary | ICD-10-CM | POA: Insufficient documentation

## 2018-10-01 DIAGNOSIS — E119 Type 2 diabetes mellitus without complications: Secondary | ICD-10-CM | POA: Insufficient documentation

## 2018-10-01 DIAGNOSIS — G510 Bell's palsy: Secondary | ICD-10-CM | POA: Insufficient documentation

## 2018-10-01 HISTORY — DX: Type 2 diabetes mellitus without complications: E11.9

## 2018-10-01 LAB — CBC WITH DIFFERENTIAL/PLATELET
Abs Immature Granulocytes: 0.02 10*3/uL (ref 0.00–0.07)
Basophils Absolute: 0.1 10*3/uL (ref 0.0–0.1)
Basophils Absolute: 0.1 10*3/uL (ref 0.0–0.1)
Basophils Relative: 1.2 % (ref 0.0–3.0)
Basophils Relative: 1 %
EOS PCT: 2 %
Eosinophils Absolute: 0.1 10*3/uL (ref 0.0–0.7)
Eosinophils Absolute: 0.1 10*3/uL (ref 0.0–0.5)
Eosinophils Relative: 0.5 % (ref 0.0–5.0)
HCT: 43.4 % (ref 39.0–52.0)
HCT: 44.7 % (ref 39.0–52.0)
Hemoglobin: 14.7 g/dL (ref 13.0–17.0)
Hemoglobin: 14.1 g/dL (ref 13.0–17.0)
Immature Granulocytes: 0 %
Lymphocytes Relative: 16.8 % (ref 12.0–46.0)
Lymphocytes Relative: 26 %
Lymphs Abs: 1.7 10*3/uL (ref 0.7–4.0)
Lymphs Abs: 1.9 10*3/uL (ref 0.7–4.0)
MCH: 26.1 pg (ref 26.0–34.0)
MCHC: 34 g/dL (ref 30.0–36.0)
MCHC: 31.5 g/dL (ref 30.0–36.0)
MCV: 80.6 fl (ref 78.0–100.0)
MCV: 82.6 fL (ref 80.0–100.0)
MONOS PCT: 12.3 % — AB (ref 3.0–12.0)
MONOS PCT: 11 %
Monocytes Absolute: 1.2 10*3/uL — ABNORMAL HIGH (ref 0.1–1.0)
Monocytes Absolute: 0.8 10*3/uL (ref 0.1–1.0)
Neutro Abs: 7 10*3/uL (ref 1.4–7.7)
Neutro Abs: 4.5 10*3/uL (ref 1.7–7.7)
Neutrophils Relative %: 69.2 % (ref 43.0–77.0)
Neutrophils Relative %: 60 %
Platelets: 196 10*3/uL (ref 150.0–400.0)
Platelets: 178 10*3/uL (ref 150–400)
RBC: 5.38 Mil/uL (ref 4.22–5.81)
RBC: 5.41 MIL/uL (ref 4.22–5.81)
RDW: 15.4 % (ref 11.5–15.5)
RDW: 14.6 % (ref 11.5–15.5)
WBC: 10.1 10*3/uL (ref 4.0–10.5)
WBC: 7.3 10*3/uL (ref 4.0–10.5)
nRBC: 0 % (ref 0.0–0.2)

## 2018-10-01 LAB — COMPREHENSIVE METABOLIC PANEL
ALBUMIN: 4.1 g/dL (ref 3.5–5.0)
ALT: 44 U/L (ref 0–44)
AST: 53 U/L — AB (ref 15–41)
Alkaline Phosphatase: 74 U/L (ref 38–126)
Anion gap: 10 (ref 5–15)
BUN: 12 mg/dL (ref 6–20)
CO2: 28 mmol/L (ref 22–32)
CREATININE: 0.78 mg/dL (ref 0.61–1.24)
Calcium: 8.9 mg/dL (ref 8.9–10.3)
Chloride: 97 mmol/L — ABNORMAL LOW (ref 98–111)
GFR calc Af Amer: >60 mL/min (ref >60)
GFR calc non Af Amer: >60 mL/min (ref >60)
Glucose, Bld: 141 mg/dL — ABNORMAL HIGH (ref 70–99)
Potassium: 3.1 mmol/L — ABNORMAL LOW (ref 3.5–5.1)
Sodium: 135 mmol/L (ref 135–145)
Total Bilirubin: 1.3 mg/dL — ABNORMAL HIGH (ref 0.3–1.2)
Total Protein: 8.6 g/dL — ABNORMAL HIGH (ref 6.5–8.1)

## 2018-10-01 LAB — TROPONIN I: Troponin I: <0.03 ng/mL (ref <0.03)

## 2018-10-01 MED ORDER — POTASSIUM CHLORIDE CRYS ER 20 MEQ PO TBCR: 40.0000 meq | EXTENDED_RELEASE_TABLET | Freq: Every day | ORAL | 0 refills | Status: DC

## 2018-10-01 MED ORDER — ALBUTEROL SULFATE HFA 108 (90 BASE) MCG/ACT IN AERS: 1.0000 | INHALATION_SPRAY | Freq: Four times a day (QID) | RESPIRATORY_TRACT | 0 refills | Status: DC | PRN

## 2018-10-01 MED ORDER — DOXYCYCLINE HYCLATE 100 MG PO CAPS: 100.0000 mg | ORAL_CAPSULE | Freq: Two times a day (BID) | ORAL | 0 refills | Status: AC

## 2018-10-01 NOTE — ED Provider Notes (Signed)
Emergency Department Provider Note   I have reviewed the triage vital signs and the nursing notes.   HISTORY  Chief Complaint Shortness of Breath   HPI Dwayne Lewis is a 48 y.o. male with PMH of DM and elevated BMI presents to the emergency department for evaluation of shortness of breath.  Patient states his difficulty breathing has been present over the past week but worsening over the past several days.  He has developed left face weakness and was diagnosed by his PCP, Dr. Larose Kells, with Bell's Palsy yesterday.  He began taking the steroid and antiviral medication this morning.  He reports shortness of breath with only mild chest pain yesterday.  No chest pain this morning.  He is having difficulty with chewing and swallowing.  He had CT scans of the head and soft tissue neck yesterday.  Patient describes a tingling in the left side of the tongue and lips.  No weakness in the left arm/leg.  No fevers or chills.  No radiation of symptoms or other modifying factors.  Past Medical History:  Diagnosis Date  . Diabetes mellitus without complication ([place])   . Hyperlipidemia    75), HDL 27, TG 105. LDL goal= <110  . Other abnormal glucose 2012    A1c 6.2%   . Seasonal allergies    RAD with RTIs only    Patient Active Problem List   Diagnosis Date Noted  . PCP NOTES >>> 07/06/2015  . Elevated LFTs 03/28/2014  . Annual physical exam 03/26/2014  . Morbid obesity with BMI of 50.0-59.9, adult ([place]) 01/02/2014  . Nonspecific abnormal electrocardiogram (ECG) (EKG) 01/10/2012  . HYPERLIPIDEMIA 11/14/2010  . ALLERGIC RHINITIS 11/14/2010  . Diabetes mellitus, type II ([place]) 10/19/2010  . ELEVATED BLOOD PRESSURE WITHOUT DIAGNOSIS OF HYPERTENSION 10/13/2010    Past Surgical History:  Procedure Laterality Date  . LASIK  2000   Bilaterally  . TONSILLECTOMY     Allergies Penicillins and Sulfonamide derivatives  Family History  Problem Relation Age of Onset  . Hypertension Mother   .  Transient ischemic attack Maternal Grandfather   . Lung cancer Maternal Grandmother   . COPD Maternal Grandmother        uncle   . Diabetes Other        MG aunt  . Colon cancer Neg Hx   . Prostate cancer Neg Hx   . CAD Neg Hx     Social History Social History   Tobacco Use  . Smoking status: Never Smoker  . Smokeless tobacco: Never Used  Substance Use Topics  . Alcohol use: Yes    Alcohol/week: 0.0 standard drinks    Comment: socially   . Drug use: No    Review of Systems  Constitutional: No fever/chills Eyes: No visual changes. ENT: No sore throat. Positive difficulty swallowing.  Cardiovascular: Positive chest pain yesterday.  Respiratory: Positive shortness of breath. Gastrointestinal: No abdominal pain.  No nausea, no vomiting.  No diarrhea.  No constipation. Genitourinary: Negative for dysuria. Musculoskeletal: Negative for back pain. Skin: Negative for rash. Neurological: Negative for headaches. Positive left face weakness.   10-point ROS otherwise negative.  ____________________________________________   PHYSICAL EXAM:  VITAL SIGNS: ED Triage Vitals  Enc Vitals Group     BP 10/01/18 0843 (!) 159/96     Pulse Rate 10/01/18 0843 (!) 102     Resp 10/01/18 0843 (!) 22     Temp 10/01/18 0843 98.2 F (36.8 C)     Temp  Source 10/01/18 0843 Oral     SpO2 10/01/18 0839 99 %     Weight 10/01/18 0835 (!) 385 lb (174.6 kg)     Height 10/01/18 0835 5\' 11"  (1.803 m)     Pain Score 10/01/18 0834 4   Constitutional: Alert and oriented. Well appearing and in no acute distress. Eyes: Conjunctivae are normal.  Head: Atraumatic. Ears:  Healthy appearing ear canals and TMs bilaterally Nose: No congestion/rhinnorhea. Mouth/Throat: Mucous membranes are moist.  Oropharynx non-erythematous. Neck: No stridor.  Cardiovascular: Normal rate, regular rhythm. Good peripheral circulation. Grossly normal heart sounds.   Respiratory: Normal respiratory effort.  No retractions.  Lungs CTAB. Gastrointestinal: Soft and nontender. No distention.  Musculoskeletal: No lower extremity tenderness nor edema. No gross deformities of extremities. Neurologic:  Normal speech and language. No gross focal neurologic deficits are appreciated.  Skin:  Skin is warm, dry and intact. No rash noted.  ____________________________________________   LABS (all labs ordered are listed, but only abnormal results are displayed)  Labs Reviewed  COMPREHENSIVE METABOLIC PANEL - Abnormal; Notable for the following components:      Result Value   Potassium 3.1 (*)    Chloride 97 (*)    Glucose, Bld 141 (*)    Total Protein 8.6 (*)    AST 53 (*)    Total Bilirubin 1.3 (*)    All other components within normal limits  TROPONIN I  CBC WITH DIFFERENTIAL/PLATELET   ____________________________________________  EKG   EKG Interpretation  Date/Time:  Tuesday October 01 2018 08:42:45 EST Ventricular Rate:  99 PR Interval:    QRS Duration: 105 QT Interval:  376 QTC Calculation: 483 R Axis:   11 Text Interpretation:  Sinus rhythm Minimal ST depression, lateral leads Borderline prolonged QT interval No STEMI.  Confirmed by Nanda Quinton [id number]) on 10/01/2018 8:49:52 AM       ____________________________________________  RADIOLOGY  Dg Chest 2 View  Result Date: 10/01/2018 CLINICAL DATA:  Shortness of breath. EXAM: CHEST - 2 VIEW COMPARISON:  01/10/2012. FINDINGS: Mediastinum and hilar structures normal. Heart size stable. Very mild right base atelectasis/infiltrate. No pleural effusion or pneumothorax. No acute bony abnormality. IMPRESSION: Very mild right base atelectasis/infiltrate Electronically Signed   By: Marcello Moores  Register   On: 10/01/2018 09:11   Ct Head Wo Contrast  Result Date: 09/30/2018 CLINICAL DATA:  Left neck mass. Facial paresis. Difficulty swallowing and shortness of breath for 1 week. EXAM: CT HEAD WITHOUT CONTRAST TECHNIQUE: Contiguous axial images were obtained from  the base of the skull through the vertex without intravenous contrast. COMPARISON:  None. FINDINGS: Brain: There is no evidence of acute infarct, intracranial hemorrhage, mass, midline shift, or extra-axial fluid collection. The ventricles and sulci are normal. Vascular: No hyperdense vessel. Skull: No fracture or focal osseous lesion. Sinuses/Orbits: Paranasal sinuses and mastoid air cells are clear. Unremarkable orbits. Other: None. IMPRESSION: Unremarkable head CT. Electronically Signed   By: Logan Bores M.D.   On: 09/30/2018 17:24   Ct Soft Tissue Neck W Contrast  Result Date: 09/30/2018 CLINICAL DATA:  Left neck mass. Facial paresis. Difficulty swallowing and shortness of breath for 1 week. EXAM: CT NECK WITH CONTRAST TECHNIQUE: Multidetector CT imaging of the neck was performed using the standard protocol following the bolus administration of intravenous contrast. CONTRAST:  179mL ISOVUE-300 IOPAMIDOL (ISOVUE-300) INJECTION 61% COMPARISON:  None. FINDINGS: Pharynx and larynx: Symmetric pharyngeal soft tissues without evidence of mass. No fluid or inflammatory changes in the retropharyngeal space. Patent airway. Salivary glands:  The parotid glands are prominent in size in a symmetric fashion without underlying mass or regional inflammatory changes. The submandibular glands are unremarkable. Thyroid: Grossly unremarkable with assessment limited by patient body habitus. Lymph nodes: Enlarged left-sided cervical lymph nodes measure up to 11 mm in short axis in level IB, 19 mm in level IIA, and 15 mm in level III. An enlarged 12 mm right level III lymph node is noted. Vascular: Unremarkable. Limited intracranial: Unremarkable. Visualized orbits: Unremarkable orbits. Mastoids and visualized paranasal sinuses: Clear. Skeleton: Moderate cervical spondylosis. No suspicious osseous lesion. Upper chest: Clear lung apices. Other: None. IMPRESSION: Left greater than right cervical lymphadenopathy. While a  reactive/inflammatory etiology is possible given the acute presentation, malignancy including lymphoproliferative disease is a concern given the size and location of the left-sided lymph nodes. Electronically Signed   By: Logan Bores M.D.   On: 09/30/2018 17:22    ____________________________________________   PROCEDURES  Procedure(s) performed:   Procedures  None ____________________________________________   INITIAL IMPRESSION / ASSESSMENT AND PLAN / ED COURSE  Pertinent labs & imaging results that were available during my care of the patient were reviewed by me and considered in my medical decision making (see chart for details).  Patient presents to the emergency department with shortness of breath in the setting of recent Bell's palsy diagnosis.  Patient does have left face weakness without sensory deficit. Limited left eye closing.  No upper or lower extremity weakness/numbness.  Exam is consistent with Bell's palsy.  Patient also describing some difficulty swallowing.  He had evaluation by his PCP yesterday including CT head and CT soft tissue neck.  The CT neck showed left cervical adenopathy I suspect is reactive secondary to the Bell's palsy and possible viral infection.  The patient is tolerating his secretions and speaking in a clear voice.  He is taking medications without difficulty.  I do not suspect an acute airway issue or esophageal impaction/stenosis. Plan for labs, CXR, and reassess. ACS and are low on my differential.  10:00 AM Lab work reviewed.  Patient has mild hypokalemia.  Plan to replace with several days of oral potassium.  Troponin is negative.  Extremely low suspicion for PE.  Chest x-ray shows mild right base atelectasis versus infiltrate.  Given the patient's cough, shortness of breath symptoms I do plan to treat with doxycycline possible early pneumonia.  No leukocytosis or hypoxemia.  Plan for albuterol inhaler which was also sent to the pharmacy.  I further  discussed the patient's Bell's palsy diagnosis.  I discussed eye care at home including drops during the day and taping the eye closed at night to prevent dryness and ulceration.  No eye pain or concern for this at this time.  Patient is to continue his steroid and antiviral medication.  He has an appointment scheduled with his PCP in 2 weeks.  I discussed emergency department return precautions in detail. ____________________________________________  FINAL CLINICAL IMPRESSION(S) / ED DIAGNOSES  Final diagnoses:  Shortness of breath  Bell's palsy  Hypokalemia    NEW OUTPATIENT MEDICATIONS STARTED DURING THIS VISIT:  New Prescriptions   ALBUTEROL (PROVENTIL HFA;VENTOLIN HFA) 108 (90 BASE) MCG/ACT INHALER    Inhale 1-2 puffs into the lungs every 6 (six) hours as needed for wheezing or shortness of breath.   DOXYCYCLINE (VIBRAMYCIN) 100 MG CAPSULE    Take 1 capsule (100 mg total) by mouth 2 (two) times daily for 7 days.   POTASSIUM CHLORIDE SA (K-DUR,KLOR-CON) 20 MEQ TABLET  Take 2 tablets (40 mEq total) by mouth daily for 4 days.    Note:  This document was prepared using Dragon voice recognition software and may include unintentional dictation errors.  Nanda Quinton, MD Emergency Medicine    Alioune Hodgkin, [facility], MD 10/01/18 [phone]

## 2018-10-01 NOTE — Discharge Instructions (Addendum)

## 2018-10-01 NOTE — ED Triage Notes (Signed)
Pt reports sob x Saturday, seen by his pcp yesterday and dx with bell's palsy, started on valacyclovir and prednisone. Pt cont with sob today, declines w/c to room from waiting area, labored breathing noted with exertion. RT Richardson Landry at bedside for assessment while pt being triaged, ekg performed during triage also. Pt is a/a/ox4, reports decreased appetite x Saturday also.

## 2018-10-01 NOTE — Assessment & Plan Note (Signed)
Difficulty swallowing, left-sided facial paresis, difficulty swallowing, enlarged parotid gland, left. The clinical picture is somewhat confusing but I suspect he has Bell's palsy, no evidence of Ramsay Hunt syndrome   Plan: CT head without, CT neck with since he has difficulty swallowing. Empiric   Valacyclovir and prednisone (if this is truly Bell's palsy he will need high doses of prednisone however the patient is diabetic and does not have a glucometer, I am reluctant to prescribe high doses). RTC 2 weeks.  See AVS

## 2018-10-02 LAB — HM DIABETES EYE EXAM

## 2018-10-03 ENCOUNTER — Ambulatory Visit (INDEPENDENT_AMBULATORY_CARE_PROVIDER_SITE_OTHER): Payer: Self-pay | Admitting: Internal Medicine

## 2018-10-03 ENCOUNTER — Encounter: Payer: Self-pay | Admitting: Internal Medicine

## 2018-10-03 VITALS — BP 138/70 | HR 93 | Temp 98.5°F | Resp 16 | Ht 71.0 in | Wt 392.0 lb

## 2018-10-03 DIAGNOSIS — G510 Bell's palsy: Secondary | ICD-10-CM | POA: Insufficient documentation

## 2018-10-03 DIAGNOSIS — E876 Hypokalemia: Secondary | ICD-10-CM

## 2018-10-03 DIAGNOSIS — E1169 Type 2 diabetes mellitus with other specified complication: Secondary | ICD-10-CM

## 2018-10-03 DIAGNOSIS — R590 Localized enlarged lymph nodes: Secondary | ICD-10-CM

## 2018-10-03 NOTE — Assessment & Plan Note (Signed)
Bell's palsy: Facial paresis is much more noticeable now, will continue with valacyclovir and prednisone, again I am reluctant to prescribe a higher dose of his steroids since he is diabetic and has no glucometer. Lymphadenopathy, left neck: On the previous visit, I felt a enlarged parotid gland, now I realize that the physical findings are c/w a lymph node that is enlarged at the left side of the neck.  This coincide with the lymph nodes seen on CT.  Will monitor clinically. Difficulty swallowing: Improved Early pneumonia?:  The patient went to the ER with shortness of breath, work-up showed possibly an infiltrate, he is prescribed doxycycline and albuterol.  He is much better. Anxiety: He was very apprehensive at the last visit >>> improved. Hypokalemia: Taking potassium for few days, BMP in 2 weeks. DM: A1C in 2 weeks  RTC 1 month

## 2018-10-03 NOTE — Progress Notes (Signed)
Pre visit review using our clinic review tool, if applicable. No additional management support is needed unless otherwise documented below in the visit note. 

## 2018-10-03 NOTE — Progress Notes (Signed)
Subjective:    Patient ID: Dwayne Lewis, male    DOB: November 26, 1970, 48 y.o.   MRN: [id number]  DOS:  10/03/2018 Type of visit - description: ER follow-up Shortly after the last visit, he went to the ER, he complained of worsening shortness of breath.  Also mild chest pain prior to the ER evaluation.  Troponin negative, potassium 3.1, AST 53 slightly elevated.  CBC normal.  Chest x-ray very mild right base atelectasis versus infiltrate.  BP was a slightly elevated at 159/96. ER MD agreed with Bell's palsy diagnosis, he was prescribed doxycycline for possible early pneumonia. They also prescribed albuterol.   Review of Systems At this point, he is actually feeling better.  "This is the first day I feel good". Although his facial paresis has worsened, he describes less difficulty swallowing. Shortness of breath has decreased significantly. Good compliance with doxycycline, Valtrex, potassium, prednisone. Has used albuterol twice.  Past Medical History:  Diagnosis Date  . Diabetes mellitus without complication ([place])   . Hyperlipidemia    75), HDL 27, TG 105. LDL goal= <110  . Other abnormal glucose 2012    A1c 6.2%   . Seasonal allergies    RAD with RTIs only    Past Surgical History:  Procedure Laterality Date  . LASIK  2000   Bilaterally  . TONSILLECTOMY      Social History   Socioeconomic History  . Marital status: Single    Spouse name: Not on file  . Number of children: 0  . Years of education: Not on file  . Highest education level: Not on file  Occupational History  . 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 Financial resource strain: Not on file  . Food insecurity:    Worry: Not on file    Inability: Not on file  . Transportation needs:    Medical: Not on file    Non-medical: Not on file  Tobacco Use  . Smoking status: Never Smoker  . Smokeless tobacco: Never Used  Substance and Sexual Activity  . Alcohol use: Yes    Alcohol/week:  0.0 standard drinks    Comment: socially   . Drug use: No  . Sexual activity: Not on file  Lifestyle  . Physical activity:    Days per week: Not on file    Minutes per session: Not on file  . Stress: Not on file  Relationships  . Social connections:    Talks on phone: Not on file    Gets together: Not on file    Attends religious service: Not on file    Active member of club or organization: Not on file    Attends meetings of clubs or organizations: Not on file    Relationship status: Not on file  . Intimate partner violence:    Fear of current or ex partner: Not on file    Emotionally abused: Not on file    Physically abused: Not on file    Forced sexual activity: Not on file  Other Topics Concern  . Not on file  Social History Narrative   Lives w/ mother to help her out (mother is [facility])      Allergies as of 10/03/2018      Reactions   Penicillins    Rash   Sulfonamide Derivatives    REACTION: rash      Medication List       Accurate as of October 03, 2018  3:55 PM. Always use your most recent med list.        albuterol 108 (90 Base) MCG/ACT inhaler Commonly known as:  PROVENTIL HFA;VENTOLIN HFA Inhale 1-2 puffs into the lungs every 6 (six) hours as needed for wheezing or shortness of breath.   doxycycline 100 MG capsule Commonly known as:  VIBRAMYCIN Take 1 capsule (100 mg total) by mouth 2 (two) times daily for 7 days.   potassium chloride SA 20 MEQ tablet Commonly known as:  K-DUR,KLOR-CON Take 2 tablets (40 mEq total) by mouth daily for 4 days.   predniSONE 20 MG tablet Commonly known as:  DELTASONE Take 1 tablet (20 mg total) by mouth daily with breakfast.   valACYclovir 1000 MG tablet Commonly known as:  VALTREX Take 1 tablet (1,000 mg total) by mouth 3 (three) times daily.           Objective:   Physical Exam HENT:     Head:     BP 138/70 (BP Location: Left Arm, Patient Position: Sitting, Cuff Size: Normal)   Pulse 93   Temp  98.5 F (36.9 C) (Oral)   Resp 16   Ht 5\' 11"  (1.803 m)   Wt (!) 392 lb (177.8 kg)   SpO2 96%   BMI 54.67 kg/m  General:   Well developed, NAD, BMI noted. HEENT:  Normocephalic.  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  EOMI.  Eyes are not injected. TMs normal, canal with no lesions Lungs:  CTA B Normal respiratory effort, no intercostal retractions, no accessory muscle use. Heart: RRR,  no murmur.  No pretibial edema bilaterally  Skin: Not pale. Not jaundice Neurologic:  alert & oriented X3.  Speech normal, gait appropriate for age and unassisted Upper and lower extremities: Motor normal Psych--  Cognition and judgment appear intact.  Cooperative with normal attention span and concentration.  Behavior appropriate. No anxious or depressed appearing.  Facial paresis involves the forehead.  See pictures         Assessment     Assessment    DM ( was prediabetic since 2012, DM since 01-2017)   Hyperlipidemia Morbid Obesity  Elevated LFTs, chronic, likely fatty liver Hepatitis B and C negative in 2010  Ultrasound 03-2014 showed fatty liver. Normal ceruloplasmin and alpha-1 antitrypsin 2015  Seasonal allergies and RAD  PLAN Bell's palsy: Facial paresis is much more noticeable now, will continue with valacyclovir and prednisone, again I am reluctant to prescribe a higher dose of his steroids since he is diabetic and has no glucometer. Lymphadenopathy, left neck: On the previous visit, I felt a enlarged parotid gland, now I realize that the physical findings are c/w a lymph node that is enlarged at the left side of the neck.  This coincide with the lymph nodes seen on CT.  Will monitor clinically. Difficulty swallowing: Improved Early pneumonia?:  The patient went to the ER with shortness of breath, work-up showed possibly an infiltrate, he is prescribed doxycycline and albuterol.  He is much better. Anxiety: He was very apprehensive at the last visit >>>  improved. Hypokalemia: Taking potassium for few days, BMP in 2 weeks. DM: A1C in 2 weeks  RTC 1 month

## 2018-10-03 NOTE — Patient Instructions (Signed)
Schedule labs to be done in 2 weeks, BMP, hyperkalemia  Schedule a routine visit in 1 month  Call if the improvement does not continue problems.   Bell Palsy, Adult  Bell palsy is a short-term inability to move muscles in part of the face. The inability to move (paralysis) results from inflammation or compression of the facial nerve, which travels along the skull and under the ear to the side of the face (7th cranial nerve). This nerve is responsible for facial movements that include blinking, closing the eyes, smiling, and frowning. What are the causes? The exact cause of this condition is not known. It may be caused by an infection from a virus, such as the chickenpox (herpes zoster), Epstein-Barr, or mumps virus. What increases the risk? You are more likely to develop this condition if:  You are pregnant.  You have diabetes.  You have had a recent infection in your nose, throat, or airways (upper respiratory infection).  You have a weakened body defense system (immune system).  You have had a facial injury, such as a fracture.  You have a family history of Bell palsy. What are the signs or symptoms? Symptoms of this condition include:  Weakness on one side of the face.  Drooping eyelid and corner of the mouth.  Excessive tearing in one eye.  Difficulty closing the eyelid.  Dry eye.  Drooling.  Dry mouth.  Changes in taste.  Change in facial appearance.  Pain behind one ear.  Ringing in one or both ears.  Sensitivity to sound in one ear.  Facial twitching.  Headache.  Impaired speech.  Dizziness.  Difficulty eating or drinking. Most of the time, only one side of the face is affected. Rarely, Bell palsy affects the whole face. How is this diagnosed? This condition is diagnosed based on:  Your symptoms.  Your medical history.  A physical exam. You may also have to see health care providers who specialize in disorders of the nerves (neurologist)  or diseases and conditions of the eye (ophthalmologist). You may have tests, such as:  A test to check for nerve damage (electromyogram).  Imaging studies, such as CT or MRI scans.  Blood tests. How is this treated? This condition affects every person differently. Sometimes symptoms go away without treatment within a couple weeks. If treatment is needed, it varies from person to person. The goal of treatment is to reduce inflammation and protect the eye from damage. Treatment for Bell palsy may include:  Medicines, such as: ? Steroids to reduce swelling and inflammation. ? Antiviral drugs. ? Pain relievers, including aspirin, acetaminophen, or ibuprofen.  Eye drops or ointment to keep your eye moist.  Eye protection, if you cannot close your eye.  Exercises or massage to regain muscle strength and function (physical therapy). Follow these instructions at home:   Take over-the-counter and prescription medicines only as told by your health care provider.  If your eye is affected: ? Keep your eye moist with eye drops or ointment as told by your health care provider. ? Follow instructions for eye care and protection as told by your health care provider.  Do any physical therapy exercises as told by your health care provider.  Keep all follow-up visits as told by your health care provider. This is important. Contact a health care provider if:  You have a fever.  Your symptoms do not get better within 2-3 weeks, or your symptoms get worse.  Your eye is red, irritated, or painful.  You have new symptoms. Get help right away if:  You have weakness or numbness in a part of your body other than your face.  You have trouble swallowing.  You develop neck pain or stiffness.  You develop dizziness or shortness of breath. Summary  Bell palsy is a short-term inability to move muscles in part of the face. The inability to move (paralysis) results from inflammation or compression of  the facial nerve.  This condition affects every person differently. Sometimes symptoms go away without treatment within a couple weeks.  If treatment is needed, it varies from person to person. The goal of treatment is to reduce inflammation and protect the eye from damage.  Contact your health care provider if your symptoms do not get better within 2-3 weeks, or your symptoms get worse. This information is not intended to replace advice given to you by your health care provider. Make sure you discuss any questions you have with your health care provider. Document Released: 08/28/2005 Document Revised: 07/27/2017 Document Reviewed: 10/31/2016 Elsevier Interactive Patient Education  2019 [employer].

## 2018-10-10 ENCOUNTER — Telehealth: Payer: Self-pay | Admitting: Internal Medicine

## 2018-10-10 NOTE — Telephone Encounter (Signed)
Please advise 

## 2018-10-10 NOTE — Telephone Encounter (Signed)
Copied from [place] [id number]. Topic: General - Other >> Oct 10, 2018 12:41 PM Judyann Munson wrote: Reason for CRM:  patient mother is calling to state the pain medication - Ibuprofen  is not helping the patient and she is wanting to know if it something else th epatient can do

## 2018-10-10 NOTE — Telephone Encounter (Deleted)
Copied from [place] [id number]. Topic: General - Other >> Oct 10, 2018 12:41 PM Judyann Munson wrote: Reason for CRM:  patient mother is calling to state the pain medication - Ibuprofen  is not helping the patient and she is wanting to know if it something else th epatient can do

## 2018-10-11 MED ORDER — HYDROCODONE-ACETAMINOPHEN 5-325 MG PO TABS: 1.0000 | ORAL_TABLET | Freq: Three times a day (TID) | ORAL | 0 refills | Status: DC | PRN

## 2018-10-11 NOTE — Telephone Encounter (Signed)
Called pt, he was at work, spoke w/ his mother: Has pain at L side of the face, no rash, paresis is ~ the same  REC: OV next week for re-eval  Rx for vicodin sent, watch drowsiness

## 2018-10-12 DIAGNOSIS — K222 Esophageal obstruction: Secondary | ICD-10-CM

## 2018-10-12 DIAGNOSIS — K2960 Other gastritis without bleeding: Secondary | ICD-10-CM

## 2018-10-12 HISTORY — DX: Esophageal obstruction: K22.2

## 2018-10-12 HISTORY — DX: Other gastritis without bleeding: K29.60

## 2018-10-14 ENCOUNTER — Encounter: Payer: Self-pay | Admitting: Internal Medicine

## 2018-10-14 ENCOUNTER — Ambulatory Visit (INDEPENDENT_AMBULATORY_CARE_PROVIDER_SITE_OTHER): Payer: Self-pay | Admitting: Internal Medicine

## 2018-10-14 VITALS — BP 126/74 | HR 93 | Temp 98.2°F | Resp 16 | Ht 71.0 in | Wt 380.5 lb

## 2018-10-14 DIAGNOSIS — E1169 Type 2 diabetes mellitus with other specified complication: Secondary | ICD-10-CM

## 2018-10-14 DIAGNOSIS — E875 Hyperkalemia: Secondary | ICD-10-CM

## 2018-10-14 DIAGNOSIS — R51 Headache: Secondary | ICD-10-CM

## 2018-10-14 DIAGNOSIS — R519 Headache, unspecified: Secondary | ICD-10-CM

## 2018-10-14 DIAGNOSIS — G510 Bell's palsy: Secondary | ICD-10-CM

## 2018-10-14 LAB — BASIC METABOLIC PANEL
BUN: 6 mg/dL (ref 6–23)
CO2: 31 mEq/L (ref 19–32)
Calcium: 9.3 mg/dL (ref 8.4–10.5)
Chloride: 98 mEq/L (ref 96–112)
Creatinine, Ser: 0.81 mg/dL (ref 0.40–1.50)
GFR: 100.89 mL/min (ref >60.00)
Glucose, Bld: 135 mg/dL — ABNORMAL HIGH (ref 70–99)
POTASSIUM: 3.7 meq/L (ref 3.5–5.1)
Sodium: 139 mEq/L (ref 135–145)

## 2018-10-14 LAB — HEMOGLOBIN A1C: HEMOGLOBIN A1C: 7.4 % — AB (ref 4.6–6.5)

## 2018-10-14 MED ORDER — GABAPENTIN 300 MG PO CAPS: 300.0000 mg | ORAL_CAPSULE | Freq: Three times a day (TID) | ORAL | 1 refills | Status: DC

## 2018-10-14 NOTE — Progress Notes (Signed)
Pre visit review using our clinic review tool, if applicable. No additional management support is needed unless otherwise documented below in the visit note. 

## 2018-10-14 NOTE — Progress Notes (Signed)
Subjective:    Patient ID: Dwayne Lewis, male    DOB: [birth year], 48 y.o.   MRN: [id number]  DOS:  10/14/2018 Type of visit - description: Acute visit The reason for today's visit is because he is having sharp, acute pains at the left side of the head on and off.  Pains are short-lived . Also states :   "I can touch that area, it hurts". Other than that, Bell's palsy has not gotten worse or better except for the difficulty swallowing which is slightly better  Review of Systems No fever chills Mild cough, occasionally at night No rash Past Medical History:  Diagnosis Date  . Diabetes mellitus without complication ([place])   . Hyperlipidemia    75), HDL 27, TG 105. LDL goal= <110  . Other abnormal glucose 2012    A1c 6.2%   . Seasonal allergies    RAD with RTIs only    Past Surgical History:  Procedure Laterality Date  . LASIK  2000   Bilaterally  . TONSILLECTOMY      Social History   Socioeconomic History  . Marital status: Single    Spouse name: Not on file  . Number of children: 0  . Years of education: Not on file  . Highest education level: Not on file  Occupational History  . 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 Financial resource strain: Not on file  . Food insecurity:    Worry: Not on file    Inability: Not on file  . Transportation needs:    Medical: Not on file    Non-medical: Not on file  Tobacco Use  . Smoking status: Never Smoker  . Smokeless tobacco: Never Used  Substance and Sexual Activity  . Alcohol use: Yes    Alcohol/week: 0.0 standard drinks    Comment: socially   . Drug use: No  . Sexual activity: Not on file  Lifestyle  . Physical activity:    Days per week: Not on file    Minutes per session: Not on file  . Stress: Not on file  Relationships  . Social connections:    Talks on phone: Not on file    Gets together: Not on file    Attends religious service: Not on file    Active member of club or organization:  Not on file    Attends meetings of clubs or organizations: Not on file    Relationship status: Not on file  . Intimate partner violence:    Fear of current or ex partner: Not on file    Emotionally abused: Not on file    Physically abused: Not on file    Forced sexual activity: Not on file  Other Topics Concern  . Not on file  Social History Narrative   Lives w/ mother to help her out (mother is [facility])      Allergies as of 10/14/2018      Reactions   Penicillins    Rash   Sulfonamide Derivatives    REACTION: rash      Medication List       Accurate as of October 14, 2018  9:43 AM. Always use your most recent med list.        albuterol 108 (90 Base) MCG/ACT inhaler Commonly known as:  PROVENTIL HFA;VENTOLIN HFA Inhale 1-2 puffs into the lungs every 6 (six) hours as needed for wheezing or shortness of breath.   HYDROcodone-acetaminophen 5-325  MG tablet Commonly known as:  NORCO/VICODIN Take 1 tablet by mouth every 8 (eight) hours as needed. Will cause drowsiness   potassium chloride SA 20 MEQ tablet Commonly known as:  K-DUR,KLOR-CON Take 2 tablets (40 mEq total) by mouth daily for 4 days.   predniSONE 20 MG tablet Commonly known as:  DELTASONE Take 1 tablet (20 mg total) by mouth daily with breakfast.   valACYclovir 1000 MG tablet Commonly known as:  VALTREX Take 1 tablet (1,000 mg total) by mouth 3 (three) times daily.           Objective:   Physical Exam HENT:     Head:     BP 126/74 (BP Location: Left Arm, Patient Position: Sitting, Cuff Size: Normal)   Pulse 93   Temp 98.2 F (36.8 C) (Oral)   Resp 16   Ht 5\' 11"  (1.803 m)   Wt (!) 380 lb 8 oz (172.6 kg)   SpO2 97%   BMI 53.07 kg/m   General:   Well developed, NAD, BMI noted. HEENT:  Normocephalic. Ears: Normal TMs and canals Still has slightly tender LAD at the jawline on the left, see previous visit. Neck: No thyromegaly Skin: No rash noted Neurologic:  alert & oriented X3.    Speech normal, gait appropriate for age and unassisted + Left facial paresis , seems unchanged  Psych--  Cognition and judgment appear intact.  Cooperative with normal attention span and concentration.  Behavior appropriate. No anxious or depressed appearing.      Assessment     Assessment    DM ( was prediabetic since 2012, DM since 01-2017)   Hyperlipidemia Morbid Obesity  Elevated LFTs, chronic, likely fatty liver Hepatitis B and C negative in 2010  Ultrasound 03-2014 showed fatty liver. Normal ceruloplasmin and alpha-1 antitrypsin 2015  Seasonal allergies and RAD  PLAN Neuropathic pain: Suspect related to Bell's palsy.  Recommend to start gabapentin 300 mg, initially BID then TID.  Call if he does not get sufficient relief. Bell's palsy: Status post antivirals and prednisone.  Motor function has not returned yet. Hypokalemia, DM: Labs today RTC to 3 months

## 2018-10-14 NOTE — Patient Instructions (Addendum)
GO TO THE LAB : Get the blood work     GO TO THE FRONT DESK Schedule your next appointment    for a checkup in 2 to 3 months  Gabapentin 300 mg: Take 1 tablet twice a day for 1 week, then increase to 1 tablet 3 times a day  Stay on it until the next visit  Call if that is not helping

## 2018-10-14 NOTE — Assessment & Plan Note (Signed)
Neuropathic pain: Suspect related to Bell's palsy.  Recommend to start gabapentin 300 mg, initially BID then TID.  Call if he does not get sufficient relief. Bell's palsy: Status post antivirals and prednisone.  Motor function has not returned yet. Hypokalemia, DM: Labs today RTC to 3 months

## 2018-10-16 ENCOUNTER — Ambulatory Visit: Payer: Self-pay | Admitting: Internal Medicine

## 2018-10-16 ENCOUNTER — Other Ambulatory Visit: Payer: Self-pay

## 2018-10-21 ENCOUNTER — Ambulatory Visit: Payer: Self-pay | Admitting: Internal Medicine

## 2018-10-21 ENCOUNTER — Encounter: Payer: Self-pay | Admitting: Internal Medicine

## 2018-10-21 VITALS — BP 116/68 | HR 108 | Temp 98.8°F | Resp 16 | Ht 71.0 in | Wt 368.1 lb

## 2018-10-21 DIAGNOSIS — K529 Noninfective gastroenteritis and colitis, unspecified: Secondary | ICD-10-CM

## 2018-10-21 DIAGNOSIS — E1169 Type 2 diabetes mellitus with other specified complication: Secondary | ICD-10-CM

## 2018-10-21 DIAGNOSIS — Z6841 Body Mass Index (BMI) 40.0 and over, adult: Secondary | ICD-10-CM

## 2018-10-21 MED ORDER — METFORMIN HCL 850 MG PO TABS: ORAL_TABLET | ORAL | 3 refills | Status: DC

## 2018-10-21 MED ORDER — ONDANSETRON HCL 4 MG PO TABS: 4.0000 mg | ORAL_TABLET | Freq: Three times a day (TID) | ORAL | 0 refills | Status: DC | PRN

## 2018-10-21 NOTE — Patient Instructions (Addendum)
Rest  Drink plenty of fluids  Zofran as needed for nausea  Pepto-Bismol as needed for diarrhea  Call if you are not improving  Decrease gabapentin to once at night only.  Your diabetes needs better control, start metformin next week or as soon as your stomach problems are gone: Half tablet twice a day for 10 days, then 1 tablet twice a day.   It is very important you educate yourself about diabetes and diet Good information is found at: The American diabetes Association     diabetes.[facility] website it is a Microbiologist  [place].org  The [facility] web site has a diabetes section  [email or web address]

## 2018-10-21 NOTE — Progress Notes (Signed)
Pre visit review using our clinic review tool, if applicable. No additional management support is needed unless otherwise documented below in the visit note. 

## 2018-10-21 NOTE — Progress Notes (Signed)
Subjective:    Patient ID: Dwayne Lewis, male    DOB: 1970/10/22, 48 y.o.   MRN: [id number]  DOS:  10/21/2018 Type of visit - description: Acute visit Symptoms of started 10/18/2018 with nausea, decreased appetite and dry heaving. He feels "cold and hot". He also had watery diarrhea. Overall, symptoms are decreasing today. Of note is, he was Rx metformin but has not started it yet.   Review of Systems No fever No GERD type of symptoms no blood in the stools No abdominal pain, if anything mild mid abdominal discomfort.   Past Medical History:  Diagnosis Date  . Diabetes mellitus without complication ([place])   . Hyperlipidemia    75), HDL 27, TG 105. LDL goal= <110  . Other abnormal glucose 2012    A1c 6.2%   . Seasonal allergies    RAD with RTIs only    Past Surgical History:  Procedure Laterality Date  . LASIK  2000   Bilaterally  . TONSILLECTOMY      Social History   Socioeconomic History  . Marital status: Single    Spouse name: Not on file  . Number of children: 0  . Years of education: Not on file  . Highest education level: Not on file  Occupational History  . 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 Financial resource strain: Not on file  . Food insecurity:    Worry: Not on file    Inability: Not on file  . Transportation needs:    Medical: Not on file    Non-medical: Not on file  Tobacco Use  . Smoking status: Never Smoker  . Smokeless tobacco: Never Used  Substance and Sexual Activity  . Alcohol use: Yes    Alcohol/week: 0.0 standard drinks    Comment: socially   . Drug use: No  . Sexual activity: Not on file  Lifestyle  . Physical activity:    Days per week: Not on file    Minutes per session: Not on file  . Stress: Not on file  Relationships  . Social connections:    Talks on phone: Not on file    Gets together: Not on file    Attends religious service: Not on file    Active member of club or organization: Not on  file    Attends meetings of clubs or organizations: Not on file    Relationship status: Not on file  . Intimate partner violence:    Fear of current or ex partner: Not on file    Emotionally abused: Not on file    Physically abused: Not on file    Forced sexual activity: Not on file  Other Topics Concern  . Not on file  Social History Narrative   Lives w/ mother to help her out (mother is [facility])      Allergies as of 10/21/2018      Reactions   Penicillins    Rash   Sulfonamide Derivatives    REACTION: rash      Medication List       Accurate as of October 21, 2018  4:21 PM. Always use your most recent med list.        albuterol 108 (90 Base) MCG/ACT inhaler Commonly known as:  PROVENTIL HFA;VENTOLIN HFA Inhale 1-2 puffs into the lungs every 6 (six) hours as needed for wheezing or shortness of breath.   gabapentin 300 MG capsule Commonly known as:  NEURONTIN  Take 1 capsule (300 mg total) by mouth 3 (three) times daily.   HYDROcodone-acetaminophen 5-325 MG tablet Commonly known as:  NORCO/VICODIN Take 1 tablet by mouth every 8 (eight) hours as needed. Will cause drowsiness   metFORMIN 850 MG tablet Commonly known as:  GLUCOPHAGE Half tablet twice a day for 10 days, then 1 tablet twice a day   potassium chloride SA 20 MEQ tablet Commonly known as:  K-DUR,KLOR-CON Take 2 tablets (40 mEq total) by mouth daily for 4 days.           Objective:   Physical Exam BP 116/68 (BP Location: Left Arm, Patient Position: Sitting, Cuff Size: Normal)   Pulse (!) 108   Temp 98.8 F (37.1 C) (Oral)   Resp 16   Ht 5\' 11"  (1.803 m)   Wt (!) 368 lb 2 oz (167 kg)   SpO2 96%   BMI 51.34 kg/m   General:   Well developed, NAD, BMI noted.  HEENT:  Normocephalic . Face symmetric, atraumatic Lungs:  CTA B Normal respiratory effort, no intercostal retractions, no accessory muscle use. Heart: RRR,  no murmur.  no pretibial edema bilaterally  Abdomen:  Not distended,  soft, minimal if any mid abdominal tenderness to palpation with no mass or rebound Skin: Not pale. Not jaundice Neurologic:  alert & oriented X3.  Speech normal, gait appropriate for age and unassisted Left facial paresis unchanged Psych--  Cognition and judgment appear intact.  Cooperative with normal attention span and concentration.  Behavior appropriate. No anxious or depressed appearing.     Assessment     Assessment    DM ( was prediabetic since 2012, DM since 01-2017)   Hyperlipidemia Morbid Obesity  Elevated LFTs, chronic, likely fatty liver Hepatitis B and C negative in 2010  Ultrasound 03-2014 showed fatty liver. Normal ceruloplasmin and alpha-1 antitrypsin 2015  Seasonal allergies and RAD  PLAN Acute gastroenteritis: Suspect symptoms are related to acute gastroenteritis, I did see a patient today with similar symptoms that started 3 days ago.  He is slightly improved overall today, recommend rest, fluids, Zofran and Pepto-Bismol.  Call if not better. DM: Based on the last A1c, metformin was prescribed, he has not started it just yet.  Recommend to wait until all his GI symptoms are gone before he start metformin. Morbid obesity: During his recent episode of Bell's palsy, his main concern was not the paresis rather than the fact that he could not eat.  He needs to change his relationship with food.  At some point we will have to address what I think is a eating disorder. Neuropathic pain: see last OV,  Improved with gabapentin. Diarrhea could be a s/e of gabapentin although that is unlikely.  Nevertheless will decrease gabapentin to 1 tablet daily

## 2018-10-21 NOTE — Addendum Note (Signed)
Addended byDamita Dunnings D on: 10/21/2018 09:35 AM   Modules accepted: Orders

## 2018-10-22 NOTE — Assessment & Plan Note (Addendum)
Acute gastroenteritis: Suspect symptoms are related to acute gastroenteritis, I did see a patient today with similar symptoms that started 3 days ago.  He is slightly improved overall today, recommend rest, fluids, Zofran and Pepto-Bismol.  Call if not better. DM: Based on the last A1c, metformin was prescribed, he has not started it just yet.  Recommend to wait until all his GI symptoms are gone before he start metformin. Morbid obesity: During his recent episode of Bell's palsy, his main concern was not the paresis rather than the fact that he could not eat.  He needs to change his relationship with food.  At some point we will have to address what I think is a eating disorder. Neuropathic pain: see last OV,  Improved with gabapentin. Diarrhea could be a s/e of gabapentin although that is unlikely.  Nevertheless will decrease gabapentin to 1 tablet daily

## 2018-10-24 ENCOUNTER — Emergency Department (HOSPITAL_BASED_OUTPATIENT_CLINIC_OR_DEPARTMENT_OTHER): Payer: Self-pay

## 2018-10-24 ENCOUNTER — Encounter (HOSPITAL_BASED_OUTPATIENT_CLINIC_OR_DEPARTMENT_OTHER): Payer: Self-pay

## 2018-10-24 ENCOUNTER — Other Ambulatory Visit: Payer: Self-pay

## 2018-10-24 ENCOUNTER — Emergency Department (HOSPITAL_BASED_OUTPATIENT_CLINIC_OR_DEPARTMENT_OTHER)
Admission: EM | Admit: 2018-10-24 | Discharge: 2018-10-25 | Disposition: A | Discharge status: Home / Self Care | Payer: Self-pay | Attending: Emergency Medicine | Admitting: Emergency Medicine

## 2018-10-24 DIAGNOSIS — R112 Nausea with vomiting, unspecified: Secondary | ICD-10-CM | POA: Insufficient documentation

## 2018-10-24 DIAGNOSIS — E119 Type 2 diabetes mellitus without complications: Secondary | ICD-10-CM | POA: Insufficient documentation

## 2018-10-24 DIAGNOSIS — R1013 Epigastric pain: Secondary | ICD-10-CM | POA: Insufficient documentation

## 2018-10-24 DIAGNOSIS — R197 Diarrhea, unspecified: Secondary | ICD-10-CM | POA: Insufficient documentation

## 2018-10-24 HISTORY — DX: Bell's palsy: G51.0

## 2018-10-24 LAB — CBC
HCT: 46 % (ref 39.0–52.0)
HCT: 46.7 % (ref 39.0–52.0)
Hemoglobin: 14.6 g/dL (ref 13.0–17.0)
Hemoglobin: 14.7 g/dL (ref 13.0–17.0)
MCH: 26.1 pg (ref 26.0–34.0)
MCH: 26.5 pg (ref 26.0–34.0)
MCHC: 31.3 g/dL (ref 30.0–36.0)
MCHC: 32 g/dL (ref 30.0–36.0)
MCV: 83 fL (ref 80.0–100.0)
MCV: 83.4 fL (ref 80.0–100.0)
Platelets: 184 10*3/uL (ref 150–400)
Platelets: 189 10*3/uL (ref 150–400)
RBC: 5.54 MIL/uL (ref 4.22–5.81)
RBC: 5.6 MIL/uL (ref 4.22–5.81)
RDW: 15.1 % (ref 11.5–15.5)
RDW: 15.1 % (ref 11.5–15.5)
WBC: 7.3 10*3/uL (ref 4.0–10.5)
WBC: 7.3 10*3/uL (ref 4.0–10.5)
nRBC: 0 % (ref 0.0–0.2)
nRBC: 0 % (ref 0.0–0.2)

## 2018-10-24 LAB — BASIC METABOLIC PANEL
Anion gap: 13 (ref 5–15)
BUN: 14 mg/dL (ref 6–20)
CO2: 27 mmol/L (ref 22–32)
Calcium: 9.5 mg/dL (ref 8.9–10.3)
Chloride: 98 mmol/L (ref 98–111)
Creatinine, Ser: 1.07 mg/dL (ref 0.61–1.24)
GFR calc Af Amer: >60 mL/min (ref >60)
GFR calc non Af Amer: >60 mL/min (ref >60)
Glucose, Bld: 126 mg/dL — ABNORMAL HIGH (ref 70–99)
Potassium: 3.3 mmol/L — ABNORMAL LOW (ref 3.5–5.1)
Sodium: 138 mmol/L (ref 135–145)

## 2018-10-24 LAB — HEPATIC FUNCTION PANEL
ALT: 129 U/L — AB (ref 0–44)
AST: 114 U/L — AB (ref 15–41)
Albumin: 4.5 g/dL (ref 3.5–5.0)
Alkaline Phosphatase: 71 U/L (ref 38–126)
Bilirubin, Direct: 0.3 mg/dL — ABNORMAL HIGH (ref 0.0–0.2)
Indirect Bilirubin: 1.6 mg/dL — ABNORMAL HIGH (ref 0.3–0.9)
Total Bilirubin: 1.9 mg/dL — ABNORMAL HIGH (ref 0.3–1.2)
Total Protein: 8.6 g/dL — ABNORMAL HIGH (ref 6.5–8.1)

## 2018-10-24 LAB — LIPASE, BLOOD: Lipase: 33 U/L (ref 11–51)

## 2018-10-24 LAB — DIFFERENTIAL
Abs Immature Granulocytes: 0.02 10*3/uL (ref 0.00–0.07)
Basophils Absolute: 0 10*3/uL (ref 0.0–0.1)
Basophils Relative: 0 %
Eosinophils Absolute: 0.1 10*3/uL (ref 0.0–0.5)
Eosinophils Relative: 1 %
Immature Granulocytes: 0 %
LYMPHS PCT: 36 %
Lymphs Abs: 2.6 10*3/uL (ref 0.7–4.0)
Monocytes Absolute: 0.7 10*3/uL (ref 0.1–1.0)
Monocytes Relative: 10 %
NEUTROS ABS: 3.9 10*3/uL (ref 1.7–7.7)
Neutrophils Relative %: 53 %

## 2018-10-24 LAB — TROPONIN I: Troponin I: <0.03 ng/mL (ref <0.03)

## 2018-10-24 MED ORDER — SODIUM CHLORIDE 0.9 % IV BOLUS
1000.0000 mL | Freq: Once | INTRAVENOUS | Status: AC
  Administered 2018-10-24: 1000 mL via INTRAVENOUS

## 2018-10-24 MED ORDER — ONDANSETRON HCL 4 MG/2ML IJ SOLN
4.0000 mg | Freq: Once | INTRAMUSCULAR | Status: AC
  Administered 2018-10-24: 4 mg via INTRAVENOUS
  Filled 2018-10-24: qty 2

## 2018-10-24 MED ORDER — SODIUM CHLORIDE 0.9% FLUSH
3.0000 mL | Freq: Once | INTRAVENOUS | Status: DC
  Filled 2018-10-24: qty 3

## 2018-10-24 NOTE — ED Notes (Signed)
Patient transported to X-ray 

## 2018-10-24 NOTE — ED Triage Notes (Signed)
C/o left side CP x today-NAD-steady gait

## 2018-10-24 NOTE — ED Notes (Signed)
Pt states he "can't drink" the oral contrast. He states it is making him throw up. No vomit appreciated, and no contrast visibly missing from bottle.

## 2018-10-24 NOTE — ED Notes (Signed)
Attempt made to get pt an IV site and have lab draw with not success.

## 2018-10-24 NOTE — ED Provider Notes (Signed)
[facility] Provider Note: Georgena Spurling, MD, FACEP  CSN: [id number] MRN: [id number] ARRIVAL: 10/24/18 at 2037 ROOM: [place]  Abdominal Pain   HISTORY OF PRESENT ILLNESS  10/24/18 11:11 PM Dwayne Lewis is a 48 y.o. male L's palsy on the left about a month ago.  Since then he has had difficulty eating and is lost a significant amount of weight.  For the past 6 days he has had intermittent nausea, vomiting and diarrhea along with epigastric pain.  The severity of this pain varies.  He has difficulty characterizing the pain.  As a result of these symptoms he has had little to eat or drink in the last week.  The day he had some sharp, well localized, pains in his chest just to the left of his sternum.  Nothing brings these pains on and they last about a minute.  There is no associated shortness of breath.  The pains are mild to moderate.   Past Medical History:  Diagnosis Date  . Bell's palsy   . Diabetes mellitus without complication ([place])   . Hyperlipidemia    75), HDL 27, TG 105. LDL goal= <110  . Other abnormal glucose 2012    A1c 6.2%   . Seasonal allergies    RAD with RTIs only    Past Surgical History:  Procedure Laterality Date  . LASIK  2000   Bilaterally  . TONSILLECTOMY      Family History  Problem Relation Age of Onset  . Hypertension Mother   . Transient ischemic attack Maternal Grandfather   . Lung cancer Maternal Grandmother   . COPD Maternal Grandmother        uncle   . Diabetes Other        MG aunt  . Colon cancer Neg Hx   . Prostate cancer Neg Hx   . CAD Neg Hx     Social History   Tobacco Use  . Smoking status: Never Smoker  . Smokeless tobacco: Never Used  Substance Use Topics  . Alcohol use: Not Currently    Alcohol/week: 0.0 standard drinks  . Drug use: No    Prior to Admission medications   Medication Sig Start Date End Date Taking? Authorizing Provider  albuterol (PROVENTIL HFA;VENTOLIN HFA) 108  (90 Base) MCG/ACT inhaler Inhale 1-2 puffs into the lungs every 6 (six) hours as needed for wheezing or shortness of breath. Patient not taking: Reported on 10/14/2018 10/01/18   Long, Wonda Olds, MD  gabapentin (NEURONTIN) 300 MG capsule Take 1 capsule (300 mg total) by mouth daily. 10/21/18   Colon Branch, MD  HYDROcodone-acetaminophen (NORCO/VICODIN) 5-325 MG tablet Take 1 tablet by mouth every 8 (eight) hours as needed. Will cause drowsiness Patient not taking: Reported on 10/21/2018 10/11/18   Colon Branch, MD  metFORMIN (GLUCOPHAGE) 850 MG tablet Half tablet twice a day for 10 days, then 1 tablet twice a day Patient not taking: Reported on 10/21/2018 10/21/18   Colon Branch, MD  ondansetron (ZOFRAN) 4 MG tablet Take 1 tablet (4 mg total) by mouth every 8 (eight) hours as needed for nausea or vomiting. 10/21/18   Colon Branch, MD    Allergies Penicillins and Sulfonamide derivatives   REVIEW OF SYSTEMS  Negative except as noted here or in the History of Present Illness.   PHYSICAL EXAMINATION  Initial Vital Signs Blood pressure (!) 160/87, pulse (!) 114, temperature 99.9 F (37.7 C), temperature source Oral,  resp. rate 20, height 5\' 11"  (1.803 m), weight (!) 165.6 kg, SpO2 98 %.  Examination General: Well-developed, obese male in no acute distress; appearance consistent with age of record HENT: normocephalic; atraumatic Eyes: pupils equal, round and reactive to light; extraocular muscles intact Neck: supple Heart: regular rate and rhythm Lungs: clear to auscultation bilaterally Abdomen: soft; nondistended; epigastric tenderness bowel sounds present Extremities: No deformity; full range of motion; trace edema of lower legs Neurologic: Awake, alert and oriented; motor function intact in all extremities and symmetric; left facial droop with mild dysarthria Skin: Warm and dry Psychiatric: Normal mood and affect   RESULTS  Summary of this visit's results, reviewed by myself:   EKG  Interpretation  Date/Time:  Thursday October 24 2018 20:49:38 EST Ventricular Rate:  106 PR Interval:  148 QRS Duration: 106 QT Interval:  360 QTC Calculation: 093 R Axis:   16 Text Interpretation:  Sinus tachycardia Nonspecific ST abnormality Abnormal ECG No STEMI.  Similar to prior  Confirmed by Nanda Quinton [phone]) on 10/24/2018 9:02:05 PM      Laboratory Studies: Results for orders placed or performed during the hospital encounter of 10/24/18 (from the past 24 hour(s))  Basic metabolic panel     Status: Abnormal   Collection Time: 10/24/18 10:38 PM  Result Value Ref Range   Sodium 138 135 - 145 mmol/L   Potassium 3.3 (L) 3.5 - 5.1 mmol/L   Chloride 98 98 - 111 mmol/L   CO2 27 22 - 32 mmol/L   Glucose, Bld 126 (H) 70 - 99 mg/dL   BUN 14 6 - 20 mg/dL   Creatinine, Ser 1.07 0.61 - 1.24 mg/dL   Calcium 9.5 8.9 - 10.3 mg/dL   GFR calc non Af Amer >60 >60 mL/min   GFR calc Af Amer >60 >60 mL/min   Anion gap 13 5 - 15  CBC     Status: None   Collection Time: 10/24/18 10:38 PM  Result Value Ref Range   WBC 7.3 4.0 - 10.5 K/uL   RBC 5.54 4.22 - 5.81 MIL/uL   Hemoglobin 14.7 13.0 - 17.0 g/dL   HCT 46.0 39.0 - 52.0 %   MCV 83.0 80.0 - 100.0 fL   MCH 26.5 26.0 - 34.0 pg   MCHC 32.0 30.0 - 36.0 g/dL   RDW 15.1 11.5 - 15.5 %   Platelets 184 150 - 400 K/uL   nRBC 0.0 0.0 - 0.2 %  Troponin I - ONCE - STAT     Status: None   Collection Time: 10/24/18 10:38 PM  Result Value Ref Range   Troponin I <0.03 <0.03 ng/mL  Differential     Status: None   Collection Time: 10/24/18 10:38 PM  Result Value Ref Range   Neutrophils Relative % 53 %   Neutro Abs 3.9 1.7 - 7.7 K/uL   Lymphocytes Relative 36 %   Lymphs Abs 2.6 0.7 - 4.0 K/uL   Monocytes Relative 10 %   Monocytes Absolute 0.7 0.1 - 1.0 K/uL   Eosinophils Relative 1 %   Eosinophils Absolute 0.1 0.0 - 0.5 K/uL   Basophils Relative 0 %   Basophils Absolute 0.0 0.0 - 0.1 K/uL   Immature Granulocytes 0 %   Abs Immature  Granulocytes 0.02 0.00 - 0.07 K/uL  Hepatic function panel     Status: Abnormal   Collection Time: 10/24/18 10:38 PM  Result Value Ref Range   Total Protein 8.6 (H) 6.5 - 8.1 g/dL  Albumin 4.5 3.5 - 5.0 g/dL   AST 114 (H) 15 - 41 U/L   ALT 129 (H) 0 - 44 U/L   Alkaline Phosphatase 71 38 - 126 U/L   Total Bilirubin 1.9 (H) 0.3 - 1.2 mg/dL   Bilirubin, Direct 0.3 (H) 0.0 - 0.2 mg/dL   Indirect Bilirubin 1.6 (H) 0.3 - 0.9 mg/dL  Lipase, blood     Status: None   Collection Time: 10/24/18 10:38 PM  Result Value Ref Range   Lipase 33 11 - 51 U/L  CBC     Status: None   Collection Time: 10/24/18 10:38 PM  Result Value Ref Range   WBC 7.3 4.0 - 10.5 K/uL   RBC 5.60 4.22 - 5.81 MIL/uL   Hemoglobin 14.6 13.0 - 17.0 g/dL   HCT 46.7 39.0 - 52.0 %   MCV 83.4 80.0 - 100.0 fL   MCH 26.1 26.0 - 34.0 pg   MCHC 31.3 30.0 - 36.0 g/dL   RDW 15.1 11.5 - 15.5 %   Platelets 189 150 - 400 K/uL   nRBC 0.0 0.0 - 0.2 %   Imaging Studies: Dg Chest 2 View  Result Date: 10/24/2018 CLINICAL DATA:  Left chest pain EXAM: CHEST - 2 VIEW COMPARISON:  10/01/2018 FINDINGS: Heart and mediastinal contours are within normal limits. No focal opacities or effusions. No acute bony abnormality. IMPRESSION: No active cardiopulmonary disease. Electronically Signed   By: Rolm Baptise M.D.   On: 10/24/2018 21:39   Ct Abdomen Pelvis W Contrast  Result Date: 10/25/2018 CLINICAL DATA:  Periumbilical pain for several days with elevated LFTs EXAM: CT ABDOMEN AND PELVIS WITH CONTRAST TECHNIQUE: Multidetector CT imaging of the abdomen and pelvis was performed using the standard protocol following bolus administration of intravenous contrast. CONTRAST:  168mL ISOVUE-300 COMPARISON:  None. FINDINGS: Lower chest: No acute abnormality. Hepatobiliary: Fatty infiltration of the liver is noted. The gallbladder is within normal limits. Pancreas: Unremarkable. No pancreatic ductal dilatation or surrounding inflammatory changes. Spleen:  Normal in size without focal abnormality. Adrenals/Urinary Tract: Adrenal glands are within normal limits with the exception of a small 15 mm lesion within the left adrenal gland. This likely represents an adenoma but is incompletely evaluated. Kidneys are well visualize without renal calculi or obstructive change. The ureters are unremarkable. The bladder is decompressed. Stomach/Bowel: Stomach is within normal limits. Appendix appears normal. No evidence of bowel wall thickening, distention, or inflammatory changes. Vascular/Lymphatic: No acute vascular calcifications are noted. Scattered small mesenteric lymph nodes are seen. These are likely reactive in nature. Reproductive: Prostate is unremarkable. Other: No abdominal wall hernia or abnormality. No abdominopelvic ascites. Musculoskeletal: Degenerative changes of lumbar spine are noted. IMPRESSION: Fatty liver. Left adrenal lesion likely representing an adenoma but incompletely evaluated on this exam. Twelve month follow-up adrenal CT could be performed as clinically necessary to assess for stability. Scattered small mesenteric lymph nodes are seen likely reactive in nature. Electronically Signed   By: Inez Catalina M.D.   On: 10/25/2018 00:46    ED COURSE and MDM  Nursing notes and initial vitals signs, including pulse oximetry, reviewed.  Vitals:   10/24/18 2048 10/24/18 2300 10/25/18 0000  BP: (!) 160/87 115/75 114/72  Pulse: (!) 114 91 89  Resp: 20 (!) 22 20  Temp: 99.9 F (37.7 C)    TempSrc: Oral    SpO2: 98% 94% 97%  Weight: (!) 165.6 kg    Height: 5\' 11"  (1.803 m)     1:12 AM Patient  advised of CT findings including fatty liver and mildly elevated LFTs.  He was advised he will need to follow-up with his PCP for further evaluation.  His pain is primarily epigastric and we will start him on a PPI for possible gastritis or early peptic ulcer disease.  He is already on Zofran for nausea.  PROCEDURES    ED DIAGNOSES     ICD-10-CM     1. Epigastric pain R10.13   2. Nausea vomiting and diarrhea R11.2    R19.7        Cheyan Frees, Jenny Reichmann, MD 10/25/18 [phone]

## 2018-10-25 MED ORDER — OMEPRAZOLE 40 MG PO CPDR: 40.0000 mg | DELAYED_RELEASE_CAPSULE | Freq: Every day | ORAL | 0 refills | Status: DC

## 2018-10-25 MED ORDER — IOPAMIDOL (ISOVUE-300) INJECTION 61%
150.0000 mL | Freq: Once | INTRAVENOUS | Status: AC | PRN
  Administered 2018-10-25: 150 mL via INTRAVENOUS

## 2018-10-25 MED ORDER — PANTOPRAZOLE SODIUM 40 MG PO TBEC
80.0000 mg | DELAYED_RELEASE_TABLET | Freq: Once | ORAL | Status: AC
  Administered 2018-10-25: 80 mg via ORAL
  Filled 2018-10-25: qty 2

## 2018-10-25 NOTE — ED Notes (Signed)
Pt and mother verbalize understanding of dc instructions and deny any further needs at this time.

## 2018-10-25 NOTE — ED Notes (Signed)
Asked pt for urine sample and he states that he cannot go, EDP notified

## 2018-10-27 DIAGNOSIS — R112 Nausea with vomiting, unspecified: Secondary | ICD-10-CM | POA: Insufficient documentation

## 2018-10-27 LAB — CBC AND DIFFERENTIAL
HCT: 43 (ref 41–53)
Hemoglobin: 14.5 (ref 13.5–17.5)
Neutrophils Absolute: 5
Platelets: 205 (ref 150–399)
WBC: 8

## 2018-10-27 LAB — BASIC METABOLIC PANEL
BUN: 8 (ref 4–21)
Creatinine: 0.7 (ref 0.6–1.3)
GLUCOSE: 112
Potassium: 2.9 — AB (ref 3.4–5.3)
Sodium: 140 (ref 137–147)

## 2018-10-27 LAB — HEPATIC FUNCTION PANEL
ALT: 135 — AB (ref 10–40)
AST: 111 — AB (ref 14–40)
Alkaline Phosphatase: 72 (ref 25–125)
Bilirubin, Total: 1.1

## 2018-10-28 ENCOUNTER — Telehealth: Payer: Self-pay | Admitting: Internal Medicine

## 2018-10-28 DIAGNOSIS — R590 Localized enlarged lymph nodes: Secondary | ICD-10-CM | POA: Insufficient documentation

## 2018-10-28 LAB — CBC AND DIFFERENTIAL
HCT: 44 (ref 41–53)
HEMOGLOBIN: 14.5 (ref 13.5–17.5)
Neutrophils Absolute: 4
PLATELETS: 186 (ref 150–399)
WBC: 7

## 2018-10-28 LAB — BASIC METABOLIC PANEL
BUN: 8 (ref 4–21)
Creatinine: 0.8 (ref 0.6–1.3)
Glucose: 140
Potassium: 2.8 — AB (ref 3.4–5.3)
Sodium: 136 — AB (ref 137–147)

## 2018-10-28 LAB — TSH: TSH: 0.7 (ref 0.41–5.90)

## 2018-10-28 NOTE — Telephone Encounter (Signed)
Advise patient, needs a ER visit, please arrange

## 2018-10-28 NOTE — Telephone Encounter (Signed)
-----   Message from Shanon Rosser, MD sent at 10/25/2018  1:15 AM EST ----- Regarding: Needs Follow-Up Elevated LFTs and fatty liver on CT. One-week history of N/V/D. Also, incidental adrenal ?adenoma.

## 2018-10-28 NOTE — Telephone Encounter (Signed)
Please contact Pt- needs ED f/u this week at his convenience. Thank you.

## 2018-10-29 HISTORY — PX: ESOPHAGOGASTRODUODENOSCOPY: SHX1529

## 2018-10-30 LAB — CBC AND DIFFERENTIAL
HCT: 44 (ref 41–53)
HEMOGLOBIN: 14.4 (ref 13.5–17.5)
Neutrophils Absolute: 7
Platelets: 198 (ref 150–399)
WBC: 9.7

## 2018-10-31 LAB — CBC AND DIFFERENTIAL
Hemoglobin: 14.3 (ref 13.5–17.5)
Neutrophils Absolute: 4
Platelets: 166 (ref 150–399)
WBC: 7.4

## 2018-10-31 LAB — BASIC METABOLIC PANEL
BUN: 11 (ref 4–21)
Creatinine: 0.9 (ref 0.6–1.3)
Glucose: 105
POTASSIUM: 3.2 — AB (ref 3.4–5.3)
Sodium: 137 (ref 137–147)

## 2018-11-01 LAB — CBC AND DIFFERENTIAL: HCT: 42 (ref 41–53)

## 2018-11-04 ENCOUNTER — Ambulatory Visit: Payer: Self-pay | Admitting: Internal Medicine

## 2018-11-05 ENCOUNTER — Telehealth: Payer: Self-pay

## 2018-11-05 ENCOUNTER — Encounter: Payer: Self-pay | Admitting: Family Medicine

## 2018-11-05 NOTE — Telephone Encounter (Signed)
LM for patient to CB to schedule a TOC appointment

## 2018-11-05 NOTE — Telephone Encounter (Signed)
Please advise 

## 2018-11-05 NOTE — Telephone Encounter (Signed)
NP appt scheduled 11/13/2018.

## 2018-11-05 NOTE — Telephone Encounter (Signed)
OK with me.  thx

## 2018-11-05 NOTE — Telephone Encounter (Signed)
Ok, thx

## 2018-11-05 NOTE — Telephone Encounter (Signed)
Copied from [place] [id number]. Topic: Appointment Scheduling - Transfer of Care >> Nov 05, 2018 11:51 AM Margot Ables wrote: Pt is requesting to transfer FROM: Dr. Kathlene November Pt is requesting to transfer TO: either doctor at [facility] Reason for requested transfer: pt lives right [place] the corner from [facility] and it would be more convenient - please advise.  Send CRM to patient's current PCP (transferring FROM).

## 2018-11-13 ENCOUNTER — Encounter: Payer: Self-pay | Admitting: Family Medicine

## 2018-11-13 ENCOUNTER — Ambulatory Visit: Payer: Self-pay | Admitting: Family Medicine

## 2018-11-13 VITALS — BP 124/78 | HR 97 | Temp 98.4°F | Resp 16 | Ht 71.0 in | Wt 362.4 lb

## 2018-11-13 DIAGNOSIS — G510 Bell's palsy: Secondary | ICD-10-CM

## 2018-11-13 DIAGNOSIS — R7401 Elevation of levels of liver transaminase levels: Secondary | ICD-10-CM

## 2018-11-13 DIAGNOSIS — K760 Fatty (change of) liver, not elsewhere classified: Secondary | ICD-10-CM

## 2018-11-13 DIAGNOSIS — R740 Nonspecific elevation of levels of transaminase and lactic acid dehydrogenase [LDH]: Secondary | ICD-10-CM

## 2018-11-13 DIAGNOSIS — E119 Type 2 diabetes mellitus without complications: Secondary | ICD-10-CM

## 2018-11-13 DIAGNOSIS — I10 Essential (primary) hypertension: Secondary | ICD-10-CM

## 2018-11-13 DIAGNOSIS — E7800 Pure hypercholesterolemia, unspecified: Secondary | ICD-10-CM

## 2018-11-13 DIAGNOSIS — R1310 Dysphagia, unspecified: Secondary | ICD-10-CM

## 2018-11-13 LAB — LIPID PANEL
Cholesterol: 147 mg/dL (ref 0–200)
HDL: 32.2 mg/dL — ABNORMAL LOW (ref >39.00)
LDL Cholesterol: 87 mg/dL (ref 0–99)
NonHDL: 114.51
TRIGLYCERIDES: 140 mg/dL (ref 0.0–149.0)
Total CHOL/HDL Ratio: 5
VLDL: 28 mg/dL (ref 0.0–40.0)

## 2018-11-13 LAB — COMPREHENSIVE METABOLIC PANEL
ALT: 116 U/L — ABNORMAL HIGH (ref 0–53)
AST: 90 U/L — AB (ref 0–37)
Albumin: 4.1 g/dL (ref 3.5–5.2)
Alkaline Phosphatase: 73 U/L (ref 39–117)
BUN: 4 mg/dL — ABNORMAL LOW (ref 6–23)
CO2: 32 meq/L (ref 19–32)
Calcium: 8.2 mg/dL — ABNORMAL LOW (ref 8.4–10.5)
Chloride: 99 mEq/L (ref 96–112)
Creatinine, Ser: 0.79 mg/dL (ref 0.40–1.50)
GFR: 103.81 mL/min (ref >60.00)
Glucose, Bld: 123 mg/dL — ABNORMAL HIGH (ref 70–99)
Potassium: 3.7 mEq/L (ref 3.5–5.1)
Sodium: 140 mEq/L (ref 135–145)
Total Bilirubin: 1.4 mg/dL — ABNORMAL HIGH (ref 0.2–1.2)
Total Protein: 6.9 g/dL (ref 6.0–8.3)

## 2018-11-13 MED ORDER — PIOGLITAZONE HCL 15 MG PO TABS: 15.0000 mg | ORAL_TABLET | Freq: Every day | ORAL | 1 refills | Status: DC

## 2018-11-13 NOTE — Progress Notes (Signed)
Office Note 11/13/2018  CC:  Chief Complaint  Patient presents with  . Establish Care    Previous PCP, Dr.Poz    HPI:  Dwayne Lewis is a 48 y.o. White male who is here to establish/transfer care. Patient's most recent primary MD: Dr. Larose Kells [facility]). Old records in EPIC/HL EMR were reviewed prior to or during today's visit.  DM: no home gluc monitoring.  Hx of mild GI upset with metformin, so he is not taking this med (esp in the context of vomiting/dysphagia lately +ongoing w/u of this problem). No known diabetic complications.  HTN: no home bp monitoring, but compliant with amlodipine-->was started on this med in hosp recently b/c bp in hosp consistently 643P systolic.  No probs with this med.  HLD/Fatty liver: AST/ALT elevated on labs last month (see lab section below). +Hx of milder elevation of transaminases in the past--per review of EMR.  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 Has GI f/u ([facility]) on 11/26/18.  Pt still with dysphagia.  Dx'd with Bells Palsy about 3 mo ago, no improvement in L sided facial weakness.  Prev health: colon ca screening: no colonoscopy in EMR. Prostate ca screening: no PSA in EMR.  Past Medical History:  Diagnosis Date  . Adenoma of left adrenal gland    incidental on CT abd 10/2018.  Marland Kitchen Bell's palsy    L side.  Pain involved (?)--got better with gabapentin.  . Diabetes mellitus without complication ([place])   . Fatty liver   . Hyperlipidemia    75), HDL 27, TG 105. LDL goal= <110  . Hypertension   . Morbid obesity ([place])   . Seasonal allergies    RAD with RTIs only    Past Surgical History:  Procedure Laterality Date  . LASIK  2000   Bilaterally  . TONSILLECTOMY      Family History  Problem Relation Age of Onset  . Hypertension Mother   . Transient ischemic attack Maternal Grandfather   . Lung cancer Maternal Grandmother   . COPD Maternal  Grandmother        uncle   . Diabetes Other        MG aunt  . Colon cancer Neg Hx   . Prostate cancer Neg Hx   . CAD Neg Hx     Social History   Socioeconomic History  . Marital status: Single    Spouse name: Not on file  . Number of children: 0  . Years of education: Not on file  . Highest education level: Not on file  Occupational History  . 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 Financial resource strain: Not on file  . Food insecurity:    Worry: Not on file    Inability: Not on file  . Transportation needs:    Medical: Not on file    Non-medical: Not on file  Tobacco Use  . Smoking status: Never Smoker  . Smokeless tobacco: Never Used  Substance and Sexual Activity  . Alcohol use: Not Currently    Alcohol/week: 0.0 standard drinks  . Drug use: No  . Sexual activity: Not on file  Lifestyle  . Physical activity:    Days per week: Not on file    Minutes per session: Not on file  . Stress: Not on file  Relationships  . Social connections:    Talks on phone: Not  on file    Gets together: Not on file    Attends religious service: Not on file    Active member of club or organization: Not on file    Attends meetings of clubs or organizations: Not on file    Relationship status: Not on file  . Intimate partner violence:    Fear of current or ex partner: Not on file    Emotionally abused: Not on file    Physically abused: Not on file    Forced sexual activity: Not on file  Other Topics Concern  . Not on file  Social History Narrative   Single, no children.   Orig from [place].   Occup: works part time for [employer].   No tobacco.   No alcohol.      Lives w/ mother to help her out (mother is Roe Wilner)    Outpatient Encounter Medications as of 11/13/2018  Medication Sig  . albuterol (PROVENTIL HFA;VENTOLIN HFA) 108 (90 Base) MCG/ACT inhaler Inhale into the lungs.  Marland Kitchen amLODipine (NORVASC) 10 MG tablet Take 10 mg by  mouth daily.  . pantoprazole (PROTONIX) 40 MG tablet TAKE 1 TABLET BY MOUTH TWICE A DAY X30 DAYS THEN ONCE DAILY  . promethazine (PHENERGAN) 25 MG tablet Take by mouth.  . gabapentin (NEURONTIN) 300 MG capsule Take 1 capsule (300 mg total) by mouth daily.  . pioglitazone (ACTOS) 15 MG tablet Take 1 tablet (15 mg total) by mouth daily.  . [DISCONTINUED] metFORMIN (GLUCOPHAGE) 850 MG tablet Half tablet twice a day for 10 days, then 1 tablet twice a day (Patient not taking: Reported on 11/13/2018)  . [DISCONTINUED] omeprazole (PRILOSEC) 40 MG capsule Take 1 capsule (40 mg total) by mouth daily. (Patient not taking: Reported on 11/13/2018)  . [DISCONTINUED] ondansetron (ZOFRAN) 4 MG tablet Take 1 tablet (4 mg total) by mouth every 8 (eight) hours as needed for nausea or vomiting. (Patient not taking: Reported on 11/13/2018)   No facility-administered encounter medications on file as of 11/13/2018.     Allergies  Allergen Reactions  . Penicillins     Rash   . Sulfonamide Derivatives     REACTION: rash   . Sulfa Antibiotics Swelling    ROS Review of Systems  Constitutional: Negative for fatigue and fever.  HENT: Negative for congestion and sore throat.   Eyes: Negative for visual disturbance.  Respiratory: Negative for cough.   Cardiovascular: Negative for chest pain.  Gastrointestinal: Positive for abdominal pain (mild constant soreness/"upset"). Negative for nausea.  Genitourinary: Negative for dysuria.  Musculoskeletal: Negative for back pain and joint swelling.  Skin: Negative for rash.  Neurological: Negative for weakness and headaches.  Hematological: Negative for adenopathy.    PE; Blood pressure 124/78, pulse 97, temperature 98.4 F (36.9 C), temperature source Oral, resp. rate 16, height 5\' 11"  (1.803 m), weight (!) 362 lb 6.4 oz (164.4 kg), SpO2 97 %. Body mass index is 50.54 kg/m.  Gen: Alert, well appearing.  Patient is oriented to person, place, time, and situation. AFFECT:  pleasant, lucid thought and speech. RUE:AVWU: no injection, icteris, swelling, or exudate.  EOMI, PERRLA. Mouth: lips without lesion/swelling.  Oral mucosa pink and moist. Oropharynx without erythema, exudate, or swelling. L sided upper and lower facial weakness.  MM's of mastication fine.  CV: RRR, no m/r/g.   LUNGS: CTA bilat, nonlabored resps, good aeration in all lung fields. ABD: soft, rotund but nondistended, non-tender.  BS normal.  NO HSM or mass. EXT:  no clubbing or cyanosis.  no edema.   Pertinent labs:  Lab Results  Component Value Date   TSH 1.30 01/19/2017   Lab Results  Component Value Date   WBC 7.3 10/24/2018   WBC 7.3 10/24/2018   HGB 14.7 10/24/2018   HGB 14.6 10/24/2018   HCT 46.0 10/24/2018   HCT 46.7 10/24/2018   MCV 83.0 10/24/2018   MCV 83.4 10/24/2018   PLT 184 10/24/2018   PLT 189 10/24/2018   Lab Results  Component Value Date   CREATININE 1.07 10/24/2018   BUN 14 10/24/2018   NA 138 10/24/2018   K 3.3 (L) 10/24/2018   CL 98 10/24/2018   CO2 27 10/24/2018   Lab Results  Component Value Date   ALT 129 (H) 10/24/2018   AST 114 (H) 10/24/2018   ALKPHOS 71 10/24/2018   BILITOT 1.9 (H) 10/24/2018   Lab Results  Component Value Date   CHOL 174 01/19/2017   Lab Results  Component Value Date   HDL 34 (L) 01/19/2017   Lab Results  Component Value Date   LDLCALC 109 (H) 01/19/2017   Lab Results  Component Value Date   TRIG 154 (H) 01/19/2017   Lab Results  Component Value Date   CHOLHDL 5.1 (H) 01/19/2017   Lab Results  Component Value Date   HGBA1C 7.4 (H) 10/14/2018    ASSESSMENT AND PLAN:   Transfer/New pt:  1) Bells Palsy; slow to resolve. Gabapentin helpful for his pain he was having with this.  2) DM 2: A1c 7.4% one month ago. Intol of metformin but may retry this med once his GI issues are resolved. Start pioglitazone 15mg  qd. Recheck A1c at f/u in 2 mo.  3) HTN: The current medical regimen is effective;  continue  present plan and medications. Rechecking lytes/cr today.  4) NASH: needs to work on diet/exercise.  Repeat hepatic panel today.  5) Dysphagia/vomiting: pt [facility] further [facility]-->will get records of recent hospitalization at [facility].  An After Visit Summary was printed and given to the patient.  Return in about 2 months (around 01/13/2019) for 30 min f/u DM/HT/GI issues.  Signed:  Crissie Sickles, MD           11/13/2018

## 2018-11-14 ENCOUNTER — Encounter: Payer: Self-pay | Admitting: Family Medicine

## 2018-11-14 ENCOUNTER — Telehealth: Payer: Self-pay

## 2018-11-14 NOTE — Telephone Encounter (Signed)
Received medical records from [facility], Dr.Messer  I reviewed records and abstracted information into pts chart.   Records have been placed on Dr. Idelle Leech desk for review.

## 2018-11-14 NOTE — Progress Notes (Signed)
10/31/18  

## 2018-11-15 ENCOUNTER — Telehealth: Payer: Self-pay | Admitting: Family Medicine

## 2018-11-15 MED ORDER — PIOGLITAZONE HCL 15 MG PO TABS: 15.0000 mg | ORAL_TABLET | Freq: Every day | ORAL | 1 refills | Status: DC

## 2018-11-15 NOTE — Telephone Encounter (Signed)
Rx printed, signed and put up front for p/u.  Pt advised and voiced understanding.

## 2018-11-15 NOTE — Telephone Encounter (Signed)
Patient cannot afford pioglitazone (ACTOS) 15 MG tablet at [facility]. Can he get a paper copy so he can take it to different pharmacy. Patient does not have insurance.

## 2018-11-24 ENCOUNTER — Encounter: Payer: Self-pay | Admitting: Family Medicine

## 2018-12-23 ENCOUNTER — Telehealth: Payer: Self-pay | Admitting: Family Medicine

## 2018-12-23 NOTE — Telephone Encounter (Signed)
LM for patient to CB °

## 2018-12-23 NOTE — Telephone Encounter (Signed)
Copied from [place] [phone]. Topic: Quick Communication - See Telephone Encounter >> Dec 23, 2018  8:13 AM Loma Boston wrote: CRM for notification. See Telephone encounter for: 12/23/18. PT called in wanting to know if request for  for his note for working 25 hours a week vs 20 hrs a week has been received by [place] and is ready for pick up. Dhe already has a return note for work but it is for 20 hours needs to be 25 hrs.  5 hr a day for 5 days.

## 2018-12-23 NOTE — Telephone Encounter (Signed)
Spoke with the patient about letter request. Patient states a letter from [place] Dr. Dayna Barker states that patient can only work 4 hours a day 5 days a week starting 12/11/18 ending 03/12/19. Patient states he can handle 5 hours a day 5 days a week now through 03/12/19. Patient requesting letter to be sent to his email [email or web address].

## 2018-12-23 NOTE — Telephone Encounter (Signed)
He was seeing Dr. Larose Kells before recently establishing with me.  Maybe he sent this letter request to Dr. Larose Kells? Get some details about what the letter is needed for, what he needs it to say and WHY, and I'll see if I can write it.-thx

## 2018-12-23 NOTE — Telephone Encounter (Signed)
We did not receive a request for work note and have not written him one in the past.   Please advise, thanks.

## 2018-12-24 NOTE — Telephone Encounter (Signed)
Please advise, thanks.

## 2018-12-24 NOTE — Telephone Encounter (Signed)
LMTCB to provide more info for letter.

## 2018-12-24 NOTE — Telephone Encounter (Signed)
  I can write the letter but I have to know WHY he can only work a certain amount of hours. What diagnosis or physical/mental ailment???  Pt needs to be more informative when asking me to write a letter like this! -thx!

## 2018-12-26 ENCOUNTER — Telehealth: Payer: Self-pay

## 2018-12-26 NOTE — Telephone Encounter (Signed)
LMTCB   Copied from [place] [phone]. Topic: Quick Sport and exercise psychologist Patient (Clinic Use ONLY) >> Dec 24, 2018 12:34 PM Emilee Hero, CMA wrote: [facility] to provide more info for letter. >> Dec 24, 2018  3:21 PM Carolyn Stare wrote:   Pt returned Brittanae call

## 2018-12-26 NOTE — Telephone Encounter (Signed)
Sw pt this morning, Dx is Abd/Epigastric pain from hospital visit from 10/27/18 w/ [facility].  Please let me know about the letter and I will inform patient.

## 2018-12-26 NOTE — Telephone Encounter (Signed)
Copied from [place] [phone]. Topic: Quick Sport and exercise psychologist Patient (Clinic Use ONLY) >> Dec 24, 2018 12:34 PM Emilee Hero, CMA wrote: [facility] to provide more info for letter. >> Dec 24, 2018  3:21 PM Carolyn Stare wrote:   Pt returned [place] call

## 2018-12-27 ENCOUNTER — Encounter: Payer: Self-pay | Admitting: Family Medicine

## 2018-12-27 NOTE — Telephone Encounter (Signed)
Letter signed and put on your desk.

## 2018-12-27 NOTE — Telephone Encounter (Signed)
LMOM advising pt regarding letter.

## 2019-01-14 ENCOUNTER — Telehealth: Payer: Self-pay

## 2019-01-14 NOTE — Telephone Encounter (Signed)
Called pt's mother to cancel. She stated she had spoken with Diane to cancel appt due to work schedule. Will call back later to reschedule. Copied from [place] [phone]. Topic: Appointment Scheduling - Scheduling Inquiry for Clinic >> Jan 14, 2019 10:42 AM Virl Axe D wrote: Reason for CRM: Pt's mother Stanton Kidney called to cancel appt for pt. No answer on FC line. Please return call. RS#[phone]

## 2019-01-15 ENCOUNTER — Ambulatory Visit: Payer: Self-pay | Admitting: Family Medicine

## 2019-01-15 ENCOUNTER — Ambulatory Visit: Payer: Self-pay | Admitting: Internal Medicine

## 2019-01-17 ENCOUNTER — Ambulatory Visit: Payer: Self-pay | Admitting: Family Medicine

## 2019-03-02 ENCOUNTER — Other Ambulatory Visit: Payer: Self-pay | Admitting: Family Medicine

## 2019-03-10 ENCOUNTER — Other Ambulatory Visit: Payer: Self-pay

## 2019-03-10 ENCOUNTER — Encounter: Payer: Self-pay | Admitting: Family Medicine

## 2019-03-10 ENCOUNTER — Ambulatory Visit: Payer: Self-pay | Admitting: Family Medicine

## 2019-03-10 VITALS — BP 123/85 | HR 76 | Temp 98.3°F | Resp 16 | Ht 71.0 in | Wt 324.2 lb

## 2019-03-10 DIAGNOSIS — R42 Dizziness and giddiness: Secondary | ICD-10-CM

## 2019-03-10 DIAGNOSIS — R634 Abnormal weight loss: Secondary | ICD-10-CM

## 2019-03-10 DIAGNOSIS — K760 Fatty (change of) liver, not elsewhere classified: Secondary | ICD-10-CM

## 2019-03-10 DIAGNOSIS — E119 Type 2 diabetes mellitus without complications: Secondary | ICD-10-CM

## 2019-03-10 DIAGNOSIS — I10 Essential (primary) hypertension: Secondary | ICD-10-CM

## 2019-03-10 LAB — COMPREHENSIVE METABOLIC PANEL
ALT: 18 U/L (ref 0–53)
AST: 20 U/L (ref 0–37)
Albumin: 4.3 g/dL (ref 3.5–5.2)
Alkaline Phosphatase: 62 U/L (ref 39–117)
BUN: 13 mg/dL (ref 6–23)
CO2: 30 mEq/L (ref 19–32)
Calcium: 9.1 mg/dL (ref 8.4–10.5)
Chloride: 99 mEq/L (ref 96–112)
Creatinine, Ser: 0.83 mg/dL (ref 0.40–1.50)
GFR: 97.93 mL/min (ref >60.00)
Glucose, Bld: 85 mg/dL (ref 70–99)
Potassium: 3.5 mEq/L (ref 3.5–5.1)
Sodium: 138 mEq/L (ref 135–145)
Total Bilirubin: 1.3 mg/dL — ABNORMAL HIGH (ref 0.2–1.2)
Total Protein: 7.1 g/dL (ref 6.0–8.3)

## 2019-03-10 LAB — HEMOGLOBIN A1C: Hgb A1c MFr Bld: 5.5 % (ref 4.6–6.5)

## 2019-03-10 NOTE — Progress Notes (Signed)
OFFICE VISIT  03/10/2019   CC:  Chief Complaint  Patient presents with  . Medication Reaction    dizziness from one of medications   HPI:    Patient is a 48 y.o. Caucasian male with morbid obesity, HTN, DM, and fatty liver who presents for dizziness. He has started to put himself first the last 4 mo and he has gotten a Therapist, art and is eating very well. Fasting today.  Gets a feeling of dizziness a few days a week for the last month.  Feels lightheaded when standing up from seated position but certainly not every time.  Lasts seconds to sometimes a minute.  No falls.  No presyncope feeling or vertigo.  No nausea.  No clear relationship to any particular med. He does pay attention to hydrating very well. BP checks 1-2 times per week are consistently 120s/80s. He has a glucometer but doesn't know how to use it.  He does recall a couple of distinct episodes in which he felt shaky and a bit sweaty and lightheaded and got something sweet to eat and the sx's resolved. However, the dizziness he speaks of today is a different scenario.  ROS: no CP, no SOB, no wheezing, no cough, no dizziness, no HAs, no rashes, no melena/hematochezia.  No polyuria or polydipsia.  No myalgias or arthralgias.   Past Medical History:  Diagnosis Date  . Adenoma of left adrenal gland    incidental on CT abd 10/2018.  Marland Kitchen Bell's palsy    L side.  Pain involved (?)--got better with gabapentin.  . Diabetes mellitus without complication ([place])   . Erosive gastritis 10/2018   EGD  . Esophageal stenosis 10/2018   dilation done  . Hyperlipidemia    75), HDL 27, TG 105. LDL goal= <110  . Hypertension   . Morbid obesity ([place])   . NASH (nonalcoholic steatohepatitis)   . Seasonal allergies    RAD with RTIs only    Past Surgical History:  Procedure Laterality Date  . ESOPHAGOGASTRODUODENOSCOPY  10/29/2018  . LASIK  2000   Bilaterally  . TONSILLECTOMY      Outpatient Medications Prior to Visit   Medication Sig Dispense Refill  . amLODipine (NORVASC) 10 MG tablet Take 10 mg by mouth daily.    . pantoprazole (PROTONIX) 40 MG tablet TAKE 1 TABLET BY MOUTH TWICE A DAY X30 DAYS THEN ONCE DAILY    . pioglitazone (ACTOS) 15 MG tablet TAKE 1 TABLET BY MOUTH EVERY DAY 30 tablet 1  . gabapentin (NEURONTIN) 300 MG capsule Take 1 capsule (300 mg total) by mouth daily.    Marland Kitchen albuterol (PROVENTIL HFA;VENTOLIN HFA) 108 (90 Base) MCG/ACT inhaler Inhale into the lungs.    . promethazine (PHENERGAN) 25 MG tablet Take by mouth.     No facility-administered medications prior to visit.     Allergies  Allergen Reactions  . Penicillins     Rash   . Sulfonamide Derivatives     REACTION: rash   . Sulfa Antibiotics Swelling    ROS As per HPI  PE: Blood pressure 123/85, pulse 76, temperature 98.3 F (36.8 C), temperature source Temporal, resp. rate 16, height 5\' 11"  (1.803 m), weight (!) 324 lb 3.2 oz (147.1 kg), SpO2 98 %. Body mass index is 45.22 kg/m.  Gen: Alert, well appearing.  Patient is oriented to person, place, time, and situation. AFFECT: pleasant, lucid thought and speech. UTM:LYYT: no injection, icteris, swelling, or exudate.  EOMI, PERRLA. Mouth: lips without lesion/swelling.  Oral mucosa pink and moist. Oropharynx without erythema, exudate, or swelling.  CV: RRR, no m/r/g.   LUNGS: CTA bilat, nonlabored resps, good aeration in all lung fields. ABD: soft, NT/ND EXT: no clubbing or cyanosis.  no edema.  Neuro: CN 2-12 intact bilaterally except he has decreased smile and decreased eye closing and decreased wrinkling of the L side of forehead -->all c/w his past dx of Bell's palsy.  Strength 5/5 in proximal and distal upper extremities and lower extremities bilaterally.    No tremor.    No ataxia.    No pronator drift.   LABS:  Lab Results  Component Value Date   TSH 0.70 10/28/2018   Lab Results  Component Value Date   WBC 7.4 10/31/2018   HGB 14.3 10/31/2018   HCT 42  11/01/2018   MCV 83.0 10/24/2018   MCV 83.4 10/24/2018   PLT 166 10/31/2018   Lab Results  Component Value Date   CREATININE 0.79 11/13/2018   BUN 4 (L) 11/13/2018   NA 140 11/13/2018   K 3.7 11/13/2018   CL 99 11/13/2018   CO2 32 11/13/2018   Lab Results  Component Value Date   ALT 116 (H) 11/13/2018   AST 90 (H) 11/13/2018   ALKPHOS 73 11/13/2018   BILITOT 1.4 (H) 11/13/2018   Lab Results  Component Value Date   CHOL 147 11/13/2018   Lab Results  Component Value Date   HDL 32.20 (L) 11/13/2018   Lab Results  Component Value Date   LDLCALC 87 11/13/2018   Lab Results  Component Value Date   TRIG 140.0 11/13/2018   Lab Results  Component Value Date   CHOLHDL 5 11/13/2018   Lab Results  Component Value Date   HGBA1C 7.4 (H) 10/14/2018    IMPRESSION AND PLAN:  1) Dizziness, suspect brief periods of bp drop--->ongoing bp med use in the context of recent 60+ lb purposeful wt loss.  Stop amlodipine.   Doubt hypoglycemia is playing a role, but will have him d/c his pioglitazone at this time as well. Check CMET and HbA1c today.  Instructions: Check blood pressure and heart rate at least 2 times in the next 1 week. Check your sugar in the morning before eating breakfast when you are able.  An After Visit Summary was printed and given to the patient.  FOLLOW UP: Return in about 1 week (around 03/17/2019) for telephone f/u dizziness/BP/glucoses.  Signed:  Crissie Sickles, MD           03/10/2019

## 2019-03-10 NOTE — Patient Instructions (Signed)
Check blood pressure and heart rate at least 2 times in the next 1 week. Check your sugar in the morning before eating breakfast when you are able.

## 2019-03-13 ENCOUNTER — Telehealth: Payer: Self-pay

## 2019-03-13 NOTE — Telephone Encounter (Signed)
No f/u appt needed next week. Have him make f/u appt to recheck bp and diabetes in 6 months.-thx

## 2019-03-13 NOTE — Telephone Encounter (Signed)
MyChart message sent with results.  

## 2019-03-21 ENCOUNTER — Ambulatory Visit: Payer: Self-pay | Admitting: Family Medicine

## 2019-05-16 ENCOUNTER — Other Ambulatory Visit: Payer: Self-pay

## 2019-05-16 DIAGNOSIS — Z20822 Contact with and (suspected) exposure to covid-19: Secondary | ICD-10-CM

## 2019-05-18 LAB — NOVEL CORONAVIRUS, NAA: SARS-CoV-2, NAA: NOT DETECTED

## 2019-05-20 ENCOUNTER — Telehealth: Payer: Self-pay | Admitting: Family Medicine

## 2019-05-20 NOTE — Telephone Encounter (Signed)
He was exposed to a fellow employee that tested positive for COVID-19. His employer sent him in for testing on Friday. He received the results Sunday, test was negative.  We He is requesting a doctor's note so he can return to work.  He needs this as soon as possible.   Please contact him [phone]

## 2019-05-20 NOTE — Telephone Encounter (Signed)
Yes, note is fine.-thx

## 2019-05-20 NOTE — Telephone Encounter (Signed)
Work note has been printed and signed, pt notified and will come pick up. Note has been placed up front.

## 2019-05-20 NOTE — Telephone Encounter (Signed)
Okay for work note? 

## 2019-07-07 ENCOUNTER — Encounter: Payer: Self-pay | Admitting: Family Medicine

## 2019-07-08 ENCOUNTER — Encounter: Payer: Self-pay | Admitting: Family Medicine

## 2019-07-08 ENCOUNTER — Ambulatory Visit (INDEPENDENT_AMBULATORY_CARE_PROVIDER_SITE_OTHER): Payer: PRIVATE HEALTH INSURANCE | Admitting: Family Medicine

## 2019-07-08 ENCOUNTER — Other Ambulatory Visit: Payer: Self-pay

## 2019-07-08 VITALS — BP 135/84 | HR 74 | Temp 98.3°F | Resp 16 | Ht 71.0 in | Wt 315.6 lb

## 2019-07-08 DIAGNOSIS — R1310 Dysphagia, unspecified: Secondary | ICD-10-CM

## 2019-07-08 DIAGNOSIS — G510 Bell's palsy: Secondary | ICD-10-CM | POA: Diagnosis not present

## 2019-07-08 NOTE — Progress Notes (Signed)
OFFICE VISIT  07/08/2019   CC:  Chief Complaint  Patient presents with  . bells palsy    twitching on left side of face for the last month   HPI:    Patient is a 48 y.o. Caucasian male who presents for feeling twitching on L side of face for about the last month. Has hx of bells palsy on left side (onset about 11 mo ago). During the last month has also noted, increased drooling on L side of mouth. The twitching starts in the L cheek near corner of mouth and extends to infraorbital region.  Lasts about 30-60 sec's, sometimes none some days and sometimes 3-4 times per day.  Not painful in the area, just bothersome. No known trigger to the twitching that he notices. Has had HA in general forehead area x 1 mo, mild intensity.  Trouble swallowing tylenol so not trying this.  Swallowing more difficult in last 1 mo.  Has on/off numbness in L side of face near lips. Words sometimes slur.   Dx of Bell's palsy 11 mo ago, no tx was given, no imaging was done and he did not see a neurologist. The sx's were actually noted incidentally when he saw his MD for bad episode of CP (cardiac w/u neg per pt)--he had noted decreased ability to blink L eye that morning. He got some recovery of function starting about 3 mo after initial sx's.   Has had ongoing mild facial droop that has persisted.  ROS: no CP, no SOB, no wheezing, no cough, no dizziness,, no vision or hearing complaints, no rashes, no melena/hematochezia.  No polyuria or polydipsia.  No myalgias or arthralgias. No focal weakness of extremities, no sensory complaints in extremities.   +Feeling of facial fullness/swelling on L side. No loss of taste probs.   Past Medical History:  Diagnosis Date  . Adenoma of left adrenal gland    incidental on CT abd 10/2018.  Marland Kitchen Bell's palsy    L side.  Pain involved (?)--got better with gabapentin.  . Diabetes mellitus without complication ([place])   . Erosive gastritis 10/2018   EGD  . Esophageal  stenosis 10/2018   dilation done  . Hyperlipidemia    75), HDL 27, TG 105. LDL goal= <110  . Hypertension   . Morbid obesity ([place])   . NASH (nonalcoholic steatohepatitis)   . Rapid weight loss 2020   Purposeful  . Seasonal allergies    RAD with RTIs only    Past Surgical History:  Procedure Laterality Date  . ESOPHAGOGASTRODUODENOSCOPY  10/29/2018  . LASIK  2000   Bilaterally  . TONSILLECTOMY      Outpatient Medications Prior to Visit  Medication Sig Dispense Refill  . pantoprazole (PROTONIX) 40 MG tablet TAKE 1 TABLET BY MOUTH TWICE A DAY X30 DAYS THEN ONCE DAILY    . amLODipine (NORVASC) 10 MG tablet Take 10 mg by mouth daily.    . pioglitazone (ACTOS) 15 MG tablet TAKE 1 TABLET BY MOUTH EVERY DAY (Patient not taking: Reported on 07/08/2019) 30 tablet 1   No facility-administered medications prior to visit.     Allergies  Allergen Reactions  . Penicillins     Rash   . Sulfonamide Derivatives     REACTION: rash   . Sulfa Antibiotics Swelling    ROS As per HPI  PE: Blood pressure 135/84, pulse 74, temperature 98.3 F (36.8 C), temperature source Temporal, resp. rate 16, height 5\' 11"  (1.803 m), weight (!) 315  lb 9.6 oz (143.2 kg), SpO2 96 %. Body mass index is 44.02 kg/m.  Gen: Alert, well appearing.  Patient is oriented to person, place, time, and situation. AFFECT: pleasant, lucid thought and speech. ENT: Ears: EACs clear and without vesicles/rash. Normal epithelium.  TMs with good light reflex and landmarks bilaterally.  Eyes: no injection, icteris, swelling, or exudate.  EOMI, PERRLA. Nose: no drainage or turbinate edema/swelling.  No injection or focal lesion.  Mouth: lips without lesion/swelling.  Oral mucosa pink and moist.  Dentition intact and without obvious caries or gingival swelling.  Oropharynx without erythema, exudate, or swelling.  Neck: mild TTP in anterior cervical region but no LAD or mass palpable. NEURO: platysma function appears symmetric.   Mild droop in L side of face from forehead to lower cheek. Mild/mod weakness with eye closing and with smiling on L.  Normal tone/contraction palpable with muscles of mastication use.  Normal sensation to fine touch in all of face/bilat. Eyes w/out erythema or discharge.  Tongue midline, palate elevation appears symmetric.   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 He has not gotten complete recovery of function but definitely has had SOME recovery of function. No convincing symptoms of other cranial nerve palsies (except possibly his swallowing dysfunction) and no exam findings to suggest other cranial nerve palsies.--he is 11 months out from the initial acute palsy. I don't think imaging is indicated.  I did observe a couple of the facial twitches he complains of but it only lasted a few seconds. I am not sure if neurology can offer him anything therapeutically (botox for L facial twitching?), but will refer to neurology for expert exam and see their thoughts on whether his dysphagia is from this or if more likely related to GI abnormality like esophageal stenosis.  No new meds or w/u today.  An After Visit Summary was printed and given to the patient.  FOLLOW UP: Return for keep appt set for 09/29/19.  Signed:  Crissie Sickles, MD           07/08/2019

## 2019-07-17 NOTE — Progress Notes (Signed)
NEUROLOGY CONSULTATION NOTE  Dwayne Lewis MRN: [id number] DOB: 1970/12/01  Referring provider: Shawnie Dapper, MD Primary care provider: Shawnie Dapper, MD  Reason for consult:  Left sided facial nerve palsy  HISTORY OF PRESENT ILLNESS: Dwayne Lewis is a 48 year old white male with diabetes mellitus, hyperlipidemia and history of esophageal stenosis s/p dilation who presents for left sided facial nerve palsy.  History supplemented by referring provider notes.  He was diagnosed with left sided Bell's palsy in January 2020.  It was found incidentally when it was noted that he had difficulty blinking his left eye.  He was prescribed prednisone and Valtrex.  He developed left sided paroxysmal facial pain and was prescribed gabapentin.  CT of head from 09/30/2018 personally reviewed and was unremarkable.  He started having improvement in symptoms over the next 3 months but continued to have mild residual left sided facial droop.  In October, he started experiencing increased drooling on the left side of his mouth.  He also has developed left sided facial twitching as well.  He also reports intermittent numbness in the left perioral region.  He has history of esophageal stenosis status post dilation, but reports increased difficulty swallowing over that past month as well.  He notes that his speech sometimes sounds slurred.    Hgb A1c from 03/10/2019 was 5.5  PAST MEDICAL HISTORY: Past Medical History:  Diagnosis Date  . Adenoma of left adrenal gland    incidental on CT abd 10/2018.  Marland Kitchen Bell's palsy    L side.  Pain involved (?)--got better with gabapentin.  . Diabetes mellitus without complication ([place])   . Erosive gastritis 10/2018   EGD  . Esophageal stenosis 10/2018   dilation done  . Hyperlipidemia    75), HDL 27, TG 105. LDL goal= <110  . Hypertension   . Morbid obesity ([place])   . NASH (nonalcoholic steatohepatitis)   . Rapid weight loss 2020   Purposeful  . Seasonal allergies     RAD with RTIs only    PAST SURGICAL HISTORY: Past Surgical History:  Procedure Laterality Date  . ESOPHAGOGASTRODUODENOSCOPY  10/29/2018  . LASIK  2000   Bilaterally  . TONSILLECTOMY      MEDICATIONS: Current Outpatient Medications on File Prior to Visit  Medication Sig Dispense Refill  . pantoprazole (PROTONIX) 40 MG tablet TAKE 1 TABLET BY MOUTH TWICE A DAY X30 DAYS THEN ONCE DAILY     No current facility-administered medications on file prior to visit.     ALLERGIES: Allergies  Allergen Reactions  . Penicillins     Rash   . Sulfonamide Derivatives     REACTION: rash   . Sulfa Antibiotics Swelling    FAMILY HISTORY: Family History  Problem Relation Age of Onset  . Hypertension Mother   . Transient ischemic attack Maternal Grandfather   . Lung cancer Maternal Grandmother   . COPD Maternal Grandmother        uncle   . Diabetes Other        MG aunt  . Colon cancer Neg Hx   . Prostate cancer Neg Hx   . CAD Neg Hx    SOCIAL HISTORY: Social History   Socioeconomic History  . Marital status: Single    Spouse name: Not on file  . Number of children: 0  . Years of education: Not on file  . Highest education level: Not on file  Occupational History  . Occupation: works for a Art gallery manager time  Social Needs  . Financial resource strain: Not on file  . Food insecurity    Worry: Not on file    Inability: Not on file  . Transportation needs    Medical: Not on file    Non-medical: Not on file  Tobacco Use  . Smoking status: Never Smoker  . Smokeless tobacco: Never Used  Substance and Sexual Activity  . Alcohol use: Not Currently    Alcohol/week: 0.0 standard drinks  . Drug use: No  . Sexual activity: Not on file  Lifestyle  . Physical activity    Days per week: Not on file    Minutes per session: Not on file  . Stress: Not on file  Relationships  . Social Herbalist on phone: Not on file    Gets together: Not on file    Attends  religious service: Not on file    Active member of club or organization: Not on file    Attends meetings of clubs or organizations: Not on file    Relationship status: Not on file  . Intimate partner violence    Fear of current or ex partner: Not on file    Emotionally abused: Not on file    Physically abused: Not on file    Forced sexual activity: Not on file  Other Topics Concern  . Not on file  Social History Narrative   Single, no children.   Orig from [place].   Occup: works part time for [employer].   No tobacco.   No alcohol.      Lives w/ mother to help her out (mother is Paediatric nurse)    REVIEW OF SYSTEMS: Constitutional: No fevers, chills, or sweats, no generalized fatigue, change in appetite Eyes: No visual changes, double vision, eye pain Ear, nose and throat: No hearing loss, ear pain, nasal congestion, sore throat Cardiovascular: No chest pain, palpitations Respiratory:  No shortness of breath at rest or with exertion, wheezes GastrointestinaI: No nausea, vomiting, diarrhea, abdominal pain, fecal incontinence Genitourinary:  No dysuria, urinary retention or frequency Musculoskeletal:  No neck pain, back pain Integumentary: No rash, pruritus, skin lesions Neurological: as above Psychiatric: No depression, insomnia, anxiety Endocrine: No palpitations, fatigue, diaphoresis, mood swings, change in appetite, change in weight, increased thirst Hematologic/Lymphatic:  No purpura, petechiae. Allergic/Immunologic: no itchy/runny eyes, nasal congestion, recent allergic reactions, rashes  PHYSICAL EXAM: Blood pressure (!) 144/93, pulse 95, height 5\' 11"  (1.803 m), weight (!) 324 lb 9.6 oz (147.2 kg), SpO2 97 %. General: No acute distress.  Patient appears well-groomed.   Head:  Normocephalic/atraumatic Eyes:  fundi examined but not visualized Neck: supple, no paraspinal tenderness, full range of motion Back: No paraspinal tenderness Heart: regular  rate and rhythm Lungs: Clear to auscultation bilaterally. Vascular: No carotid bruits. Neurological Exam: Mental status: alert and oriented to person, place, and time, recent and remote memory intact, fund of knowledge intact, attention and concentration intact, speech fluent and not dysarthric, language intact. Cranial nerves: CN I: not tested CN II: pupils equal, round and reactive to light, visual fields intact CN III, IV, VI:  full range of motion, no nystagmus, no ptosis CN V: endorses reduced sensation on left V1-V3 CN VII: left upper and lower facial weakness CN VIII: hearing intact CN IX, X: gag intact, uvula midline CN XI: sternocleidomastoid and trapezius muscles intact CN XII: tongue midline Bulk & Tone: normal, no fasciculations. Motor:  5/5 throughout Sensation:   temperature and vibration  sensation intact.   Deep Tendon Reflexes:  2+ throughout, toes downgoing.   Finger to nose testing:  Without dysmetria.   Heel to shin:  Without dysmetria.   Gait:  Normal station and stride.  Able to turn and tandem walk. Romberg negative  IMPRESSION: Left facial nerve palsy.  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  Unfortunately, symptoms may not ever completely resolve.  It may be secondary to diabetes however Hgb A1c has improved over the past year.  However, since he reports increased drooling, suggesting increased lower facial weakness, further workup is warranted.  PLAN: 1.  MRI of brain and left facial nerve with and without contrast 2.  Check labs:  ANA, sed rate, Sjogren's antibodies (SSA/SSB), ANCA, Lyme, ACE. 3.  Follow up after testing.  Thank you for allowing me to take part in the care of this patient.  Metta Clines, DO  CC:  Shawnie Dapper, MD

## 2019-07-18 ENCOUNTER — Encounter: Payer: Self-pay | Admitting: Neurology

## 2019-07-18 ENCOUNTER — Other Ambulatory Visit: Payer: PRIVATE HEALTH INSURANCE

## 2019-07-18 ENCOUNTER — Ambulatory Visit (INDEPENDENT_AMBULATORY_CARE_PROVIDER_SITE_OTHER): Payer: PRIVATE HEALTH INSURANCE | Admitting: Neurology

## 2019-07-18 ENCOUNTER — Other Ambulatory Visit: Payer: Self-pay

## 2019-07-18 VITALS — BP 144/93 | HR 95 | Ht 71.0 in | Wt 324.6 lb

## 2019-07-18 DIAGNOSIS — G510 Bell's palsy: Secondary | ICD-10-CM | POA: Diagnosis not present

## 2019-07-18 LAB — SEDIMENTATION RATE: Sed Rate: 17 mm/h — ABNORMAL HIGH (ref 0–15)

## 2019-07-18 NOTE — Patient Instructions (Addendum)
1.  We will check blood work:  ANA, Sed Rate, Sjogren's antibodies (SSA/SSB), ANCA, Lyme, ACE. 2.   We will check MRI of left facial nerve with and without contrast. 3.  Follow up after testing (virtual visit is okay)  Your provider has requested that you have labwork completed today. Please go to [facility] ([id number]) on the second floor of this building before leaving the office today. You do not need to check in. If you are not called within 15 minutes please check with the front desk.    A referral to [facility] has been placed for your MRI someone will contact you directly to schedule your appt. They are located at [place]. Please contact them directly by calling [phone] with any questions regarding your referral.

## 2019-07-18 NOTE — Addendum Note (Signed)
Addended by: Ranae Plumber on: 07/18/2019 03:20 PM   Modules accepted: Orders

## 2019-07-18 NOTE — Addendum Note (Signed)
Addended by: Kaylyn Lim I on: 07/18/2019 03:42 PM   Modules accepted: Orders

## 2019-07-18 NOTE — Addendum Note (Signed)
Addended by: Ranae Plumber on: 07/18/2019 03:40 PM   Modules accepted: Orders

## 2019-07-22 LAB — SJOGREN'S SYNDROME ANTIBODS(SSA + SSB)
SSA (Ro) (ENA) Antibody, IgG: <1 AI
SSB (La) (ENA) Antibody, IgG: <1 AI

## 2019-07-22 LAB — LYME AB/WESTERN BLOT REFLEX
LYME DISEASE AB, QUANT, IGM: <0.8 index (ref 0.00–0.79)
Lyme IgG/IgM Ab: <0.91 {ISR} (ref 0.00–0.90)

## 2019-07-22 LAB — ANA: Anti Nuclear Antibody (ANA): NEGATIVE

## 2019-07-22 LAB — ANCA SCREEN W REFLEX TITER: ANCA Screen: NEGATIVE

## 2019-07-23 ENCOUNTER — Telehealth: Payer: Self-pay

## 2019-07-23 ENCOUNTER — Other Ambulatory Visit: Payer: PRIVATE HEALTH INSURANCE

## 2019-07-23 ENCOUNTER — Other Ambulatory Visit: Payer: Self-pay

## 2019-07-23 DIAGNOSIS — G510 Bell's palsy: Secondary | ICD-10-CM

## 2019-07-23 NOTE — Telephone Encounter (Signed)
Called spoke with patient he was informed of results. New lab orders placed Patient will have done today.

## 2019-07-23 NOTE — Addendum Note (Signed)
Addended by: Ranae Plumber on: 07/23/2019 10:51 AM   Modules accepted: Orders

## 2019-07-23 NOTE — Telephone Encounter (Signed)
-----   Message from Pieter Partridge, DO sent at 07/22/2019  7:45 PM EST ----- All the labs are normal.  However, I also wanted to check an ACE level (angiotensin converting enzyme) which appears to not have been performed.

## 2019-07-25 LAB — ANGIOTENSIN CONVERTING ENZYME: Angiotensin-Converting Enzyme: 39 U/L (ref 9–67)

## 2019-07-28 ENCOUNTER — Telehealth: Payer: Self-pay

## 2019-07-28 NOTE — Telephone Encounter (Signed)
Called patient no answer left message labs normal 

## 2019-07-28 NOTE — Telephone Encounter (Signed)
-----   Message from Pieter Partridge, DO sent at 07/28/2019  7:30 AM EST ----- ACE level is normal

## 2019-08-01 ENCOUNTER — Encounter: Payer: Self-pay | Admitting: Family Medicine

## 2019-08-06 ENCOUNTER — Ambulatory Visit: Payer: PRIVATE HEALTH INSURANCE | Admitting: Neurology

## 2019-08-11 ENCOUNTER — Telehealth: Payer: Self-pay

## 2019-08-11 ENCOUNTER — Other Ambulatory Visit: Payer: Self-pay

## 2019-08-11 ENCOUNTER — Ambulatory Visit (INDEPENDENT_AMBULATORY_CARE_PROVIDER_SITE_OTHER): Payer: PRIVATE HEALTH INSURANCE | Admitting: Family Medicine

## 2019-08-11 ENCOUNTER — Encounter: Payer: Self-pay | Admitting: Family Medicine

## 2019-08-11 VITALS — Temp 98.7°F | Wt 320.0 lb

## 2019-08-11 DIAGNOSIS — Z7251 High risk heterosexual behavior: Secondary | ICD-10-CM | POA: Diagnosis not present

## 2019-08-11 NOTE — Telephone Encounter (Signed)
Pt had visit today, 08/11/19 with PCP. I called him to set up a non-fasting lab visit. He said he could do this Wednesday 12/2 at 8am on his way to work. I wanted to know if it was possible to open the 8am slot for his bloodwork.   Please let me know if this can be done, thanks so much.

## 2019-08-11 NOTE — Progress Notes (Signed)
Virtual Visit via Video Note  I connected with Dwayne Lewis on 08/11/19 at  4:00 PM EST by a video enabled telemedicine application and verified that I am speaking with the correct person using two identifiers.  Location patient: home Location provider:work or home office Persons participating in the virtual visit: patient, provider  I discussed the limitations of evaluation and management by telemedicine and the availability of in person appointments. The patient expressed understanding and agreed to proceed.  Telemedicine visit is a necessity given the COVID-19 restrictions in place at the current time.  HPI: 48 y/o WM being seen today to discuss preexposure prophylaxis for HIV. He has sex with men.  Denies hx of known exposure to HIV. Denies hx of STD. Feeling well lately.    ROS: See pertinent positives and negatives per HPI.  Past Medical History:  Diagnosis Date  . Adenoma of left adrenal gland    incidental on CT abd 10/2018.  Marland Kitchen Bell's palsy    L side.  Pain involved (?)--got better with gabapentin.  . Diabetes mellitus without complication ([place])   . Erosive gastritis 10/2018   EGD  . Esophageal stenosis 10/2018   dilation done  . Hyperlipidemia    75), HDL 27, TG 105. LDL goal= <110  . Hypertension   . Morbid obesity ([place])   . NASH (nonalcoholic steatohepatitis)   . Rapid weight loss 2020   Purposeful  . Seasonal allergies    RAD with RTIs only    Past Surgical History:  Procedure Laterality Date  . ESOPHAGOGASTRODUODENOSCOPY  10/29/2018  . LASIK  2000   Bilaterally  . TONSILLECTOMY      Family History  Problem Relation Age of Onset  . Hypertension Mother   . Transient ischemic attack Maternal Grandfather   . Lung cancer Maternal Grandmother   . COPD Maternal Grandmother        uncle   . Diabetes Other        MG aunt  . Colon cancer Neg Hx   . Prostate cancer Neg Hx   . CAD Neg Hx     SOCIAL HX:  Social History   Socioeconomic History  . Marital  status: Single    Spouse name: Not on file  . Number of children: 0  . Years of education: Not on file  . Highest education level: Some college, no degree  Occupational History  . 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 Financial resource strain: Not on file  . Food insecurity    Worry: Not on file    Inability: Not on file  . Transportation needs    Medical: Not on file    Non-medical: Not on file  Tobacco Use  . Smoking status: Never Smoker  . Smokeless tobacco: Never Used  Substance and Sexual Activity  . Alcohol use: Not Currently    Alcohol/week: 0.0 standard drinks  . Drug use: No  . Sexual activity: Not on file  Lifestyle  . Physical activity    Days per week: Not on file    Minutes per session: Not on file  . Stress: Not on file  Relationships  . Social Herbalist on phone: Not on file    Gets together: Not on file    Attends religious service: Not on file    Active member of club or organization: Not on file    Attends meetings of clubs or organizations: Not on file  Relationship status: Not on file  Other Topics Concern  . Not on file  Social History Narrative   Single, no children.   Orig from [place].   Occup: works part time for [employer].   No tobacco.   No alcohol.      Lives w/ mother to help her out (mother is [name])      Current Outpatient Medications:  .  pantoprazole (PROTONIX) 40 MG tablet, TAKE 1 TABLET BY MOUTH TWICE A DAY X30 DAYS THEN ONCE DAILY, Disp: , Rfl:   EXAM:  VITALS per patient if applicable: Temp [id number] F (37.1 C) (Oral)   Wt (!) 320 lb (145.2 kg)   BMI 44.63 kg/m    GENERAL: alert, oriented, appears well and in no acute distress  HEENT: atraumatic, conjunttiva clear, no obvious abnormalities on inspection of external nose and ears  NECK: normal movements of the head and neck  LUNGS: on inspection no signs of respiratory distress, breathing rate appears  normal, no obvious gross SOB, gasping or wheezing  CV: no obvious cyanosis  MS: moves all visible extremities without noticeable abnormality  PSYCH/NEURO: pleasant and cooperative, no obvious depression or anxiety, speech and thought processing grossly intact  LABS: none today    Chemistry      Component Value Date/Time   NA 138 03/10/2019 1451   NA 137 10/31/2018   K 3.5 03/10/2019 1451   CL 99 03/10/2019 1451   CO2 30 03/10/2019 1451   BUN 13 03/10/2019 1451   BUN 11 10/31/2018   CREATININE 0.83 03/10/2019 1451   CREATININE 0.85 01/19/2017 1609   GLU 105 10/31/2018      Component Value Date/Time   CALCIUM 9.1 03/10/2019 1451   ALKPHOS 62 03/10/2019 1451   AST 20 03/10/2019 1451   ALT 18 03/10/2019 1451   BILITOT 1.3 (H) 03/10/2019 1451     Lab Results  Component Value Date   WBC 7.4 10/31/2018   HGB 14.3 10/31/2018   HCT 42 11/01/2018   MCV 83.0 10/24/2018   MCV 83.4 10/24/2018   PLT 166 10/31/2018   Lab Results  Component Value Date   CHOL 147 11/13/2018   HDL 32.20 (L) 11/13/2018   LDLCALC 87 11/13/2018   LDLDIRECT 92.0 03/30/2015   TRIG 140.0 11/13/2018   CHOLHDL 5 11/13/2018   Lab Results  Component Value Date   HGBA1C 5.5 03/10/2019   Lab Results  Component Value Date   TSH 0.70 10/28/2018    ASSESSMENT AND PLAN:  High risk sexual behavior. Has sex with men, Dwayne Lewis interested in HIV pre-exposure prophylaxis. Plan: check HIV, Hep Bs Ag, Hep C ab, RPR, GC/chlamydia. After HIV neg test->start Descovy 200-25 qd.  May take 3 wks to get to effective levels in blood. Possible side effects: flatulence, diarrhea, abd pain, nausea, HA, wt loss. Still need to use condoms.   I discussed the assessment and treatment plan with the patient. The patient was provided an opportunity to ask questions and all were answered. The patient agreed with the plan and demonstrated an understanding of the instructions.   The patient was advised to call back or seek an  in-person evaluation if the symptoms worsen or if the condition fails to improve as anticipated.  F/u: has CPE scheduled for 09/2018 with me already.  Signed:  Crissie Sickles, MD           08/11/2019

## 2019-08-12 ENCOUNTER — Other Ambulatory Visit: Payer: Self-pay

## 2019-08-13 ENCOUNTER — Ambulatory Visit (INDEPENDENT_AMBULATORY_CARE_PROVIDER_SITE_OTHER): Payer: PRIVATE HEALTH INSURANCE | Admitting: Family Medicine

## 2019-08-13 ENCOUNTER — Other Ambulatory Visit (HOSPITAL_COMMUNITY)
Admission: RE | Admit: 2019-08-13 | Discharge: 2019-08-13 | Disposition: A | Discharge status: Home / Self Care | Payer: PRIVATE HEALTH INSURANCE | Source: Ambulatory Visit | Attending: Family Medicine | Admitting: Family Medicine

## 2019-08-13 DIAGNOSIS — Z7251 High risk heterosexual behavior: Secondary | ICD-10-CM | POA: Insufficient documentation

## 2019-08-13 LAB — BASIC METABOLIC PANEL
BUN: 10 mg/dL (ref 6–23)
CO2: 30 mEq/L (ref 19–32)
Calcium: 9 mg/dL (ref 8.4–10.5)
Chloride: 101 mEq/L (ref 96–112)
Creatinine, Ser: 0.72 mg/dL (ref 0.40–1.50)
GFR: 115.2 mL/min (ref >60.00)
Glucose, Bld: 102 mg/dL — ABNORMAL HIGH (ref 70–99)
Potassium: 3.9 mEq/L (ref 3.5–5.1)
Sodium: 139 mEq/L (ref 135–145)

## 2019-08-13 NOTE — Telephone Encounter (Signed)
Due to the holiday and time off I am just seeing this. If you send me something in Epic I need to see/address please let me know. I am not in Epic much. Please call the patient and see when he can come in. Me, Maudie Mercury and Anderson Malta can all 3 open slots. Thank you!

## 2019-08-13 NOTE — Telephone Encounter (Signed)
Dwayne Lewis was able to assist with scheduling patient. He had labs done this morning and just came in earlier than scheduled time.

## 2019-08-14 ENCOUNTER — Other Ambulatory Visit: Payer: Self-pay

## 2019-08-14 ENCOUNTER — Ambulatory Visit
Admission: RE | Admit: 2019-08-14 | Discharge: 2019-08-14 | Disposition: A | Discharge status: Home / Self Care | Payer: PRIVATE HEALTH INSURANCE | Source: Ambulatory Visit | Attending: Neurology | Admitting: Neurology

## 2019-08-14 DIAGNOSIS — G510 Bell's palsy: Secondary | ICD-10-CM

## 2019-08-14 LAB — HEPATITIS C ANTIBODY
Hepatitis C Ab: NONREACTIVE
SIGNAL TO CUT-OFF: 0.02 (ref <1.00)

## 2019-08-14 LAB — URINE CYTOLOGY ANCILLARY ONLY
Chlamydia: NEGATIVE
Comment: NEGATIVE
Comment: NORMAL
Neisseria Gonorrhea: NEGATIVE

## 2019-08-14 LAB — HIV ANTIBODY (ROUTINE TESTING W REFLEX): HIV 1&2 Ab, 4th Generation: NONREACTIVE

## 2019-08-14 LAB — HEPATITIS B SURFACE ANTIGEN: Hepatitis B Surface Ag: NONREACTIVE

## 2019-08-14 LAB — RPR: RPR Ser Ql: NONREACTIVE

## 2019-08-15 ENCOUNTER — Other Ambulatory Visit: Payer: Self-pay | Admitting: Family Medicine

## 2019-08-15 ENCOUNTER — Telehealth: Payer: Self-pay

## 2019-08-15 ENCOUNTER — Other Ambulatory Visit: Payer: Self-pay | Admitting: Neurology

## 2019-08-15 ENCOUNTER — Encounter: Payer: Self-pay | Admitting: Family Medicine

## 2019-08-15 ENCOUNTER — Telehealth: Payer: Self-pay | Admitting: Neurology

## 2019-08-15 DIAGNOSIS — G510 Bell's palsy: Secondary | ICD-10-CM

## 2019-08-15 MED ORDER — DIAZEPAM 10 MG PO TABS: ORAL_TABLET | ORAL | 0 refills | Status: DC

## 2019-08-15 MED ORDER — DESCOVY 200-25 MG PO TABS: 1.0000 | ORAL_TABLET | Freq: Every day | ORAL | 1 refills | Status: DC

## 2019-08-15 NOTE — Telephone Encounter (Signed)
Patient was unable to have his MRI yesterday and would like Dr. Tomi Likens to please call in something for him to take to calm him during the MRI. He uses [facility] in [place]. Thank you

## 2019-08-15 NOTE — Telephone Encounter (Signed)
See below Will you be willing to send him something to help keep him calm?

## 2019-08-15 NOTE — Telephone Encounter (Signed)
Called patient he was informed of provider response. MRI has been R/S to next month.

## 2019-08-15 NOTE — Telephone Encounter (Signed)
PA sent via covermymed on 08/15/19.   Key: [id number]   Medication: Descovy   Dx: Z72.51   Per Dr. Anitra Lauth pt has tried and failed n/a   Waiting for response.

## 2019-08-15 NOTE — Telephone Encounter (Signed)
I sent a prescription for Valium.  He is to take it 30 to 40 minutes prior to the MRI.  He must have a driver transporting him to and from the MRI facility as Valium may cause drowsiness.

## 2019-08-18 ENCOUNTER — Ambulatory Visit: Payer: PRIVATE HEALTH INSURANCE | Admitting: Family Medicine

## 2019-08-18 NOTE — Telephone Encounter (Signed)
PA was approved 08/15/19-08/14/20. Patient aware. Paperwork will be scanned into chart.

## 2019-08-19 ENCOUNTER — Encounter: Payer: Self-pay | Admitting: Family Medicine

## 2019-08-19 ENCOUNTER — Ambulatory Visit (INDEPENDENT_AMBULATORY_CARE_PROVIDER_SITE_OTHER): Payer: PRIVATE HEALTH INSURANCE | Admitting: Family Medicine

## 2019-08-19 ENCOUNTER — Other Ambulatory Visit: Payer: Self-pay

## 2019-08-19 DIAGNOSIS — J0140 Acute pansinusitis, unspecified: Secondary | ICD-10-CM | POA: Diagnosis not present

## 2019-08-19 MED ORDER — AZITHROMYCIN 250 MG PO TABS: ORAL_TABLET | ORAL | 0 refills | Status: DC

## 2019-08-19 NOTE — Progress Notes (Signed)
CC: Sinus pain  Dwayne Lewis here for URI complaints. Due to COVID-19 pandemic, we are interacting via web portal for an electronic face-to-face visit. I verified patient's ID using 2 identifiers. Patient agreed to proceed with visit via this method. Patient is at home, I am at office. Patient and I are present for visit.   Duration: 1 day  Associated symptoms: sinus congestion, sinus pain, rhinorrhea, itchy watery eyes, sore throat and cough from drainage Denies: ear pain, ear drainage, wheezing, shortness of breath, myalgia and fevers, gi s/s's Treatment to date: [place] contacts: No  ROS:  Const: Denies fevers HEENT: As noted in HPI Lungs: No SOB  Past Medical History:  Diagnosis Date  . Adenoma of left adrenal gland    incidental on CT abd 10/2018.  Marland Kitchen Bell's palsy    L side.  Pain involved (?)--got better with gabapentin.  . Diabetes mellitus without complication ([place])   . Erosive gastritis 10/2018   EGD  . Esophageal stenosis 10/2018   dilation done  . High risk sexual behavior    sex with men: started PrEP (descovy) 08/15/19.  Marland Kitchen Hyperlipidemia    75), HDL 27, TG 105. LDL goal= <110  . Hypertension   . Morbid obesity ([place])   . NASH (nonalcoholic steatohepatitis)   . Rapid weight loss 2020   Purposeful  . Seasonal allergies    RAD with RTIs only   Exam No conversational dyspnea Age appropriate judgment and insight Nml affect and mood  Acute pansinusitis, recurrence not specified - Plan: azithromycin (ZITHROMAX) 250 MG tablet  Orders as above. Continue to push fluids, practice good hand hygiene, cover mouth when coughing. F/u prn. If starting to experience fevers, shaking, or shortness of breath, seek immediate care. Pt voiced understanding and agreement to the plan.  [place], DO 08/19/19 4:53 PM

## 2019-08-20 ENCOUNTER — Encounter: Payer: Self-pay | Admitting: Family Medicine

## 2019-08-20 MED ORDER — DESCOVY 200-25 MG PO TABS: 1.0000 | ORAL_TABLET | Freq: Every day | ORAL | 1 refills | Status: DC

## 2019-08-20 NOTE — Telephone Encounter (Signed)
MyChart message read.

## 2019-08-20 NOTE — Telephone Encounter (Signed)
Patient would like written Rx for Descovy prescribed on 08/15/19. After insurance, med is still costly. Please advise, thanks.

## 2019-08-20 NOTE — Telephone Encounter (Signed)
OK, descovy rx printed.

## 2019-08-22 ENCOUNTER — Telehealth: Payer: Self-pay | Admitting: Family Medicine

## 2019-08-22 NOTE — Telephone Encounter (Signed)
Letter completed, given to pt.

## 2019-08-22 NOTE — Telephone Encounter (Signed)
Yes, letter is fine

## 2019-08-22 NOTE — Telephone Encounter (Signed)
Patient called regarding two (2) requests  1)   Patient picked up written prescription yesterday for emtricitabine-tenofovir AF (DESCOVY) 200-25 MG tablet [id number]  Patient didn't realize that he needed [place] to fax the prescription to his insurance so they will pay at 100%.  They will not accept a fax from the patient. He needs this done by closing of today. Their fax number is [phone]   2)   Patient had a virtual visit on 12/8 by Dr. Nani Ravens. Patient needs return to work note to report tomorrow 08/23/19. Dr. Nani Ravens has been out of office since Thursday. Please ask Dr. Anitra Lauth to write letter for patient saying he can report back to work tomorrow.  Patient can be reached at [phone].    Thank you

## 2019-08-22 NOTE — Telephone Encounter (Signed)
Patient is bringing original written Rx to be faxed. Okay for work note?

## 2019-09-10 ENCOUNTER — Other Ambulatory Visit: Payer: PRIVATE HEALTH INSURANCE

## 2019-09-13 ENCOUNTER — Inpatient Hospital Stay: Admission: RE | Admit: 2019-09-13 | Payer: PRIVATE HEALTH INSURANCE | Source: Ambulatory Visit

## 2019-09-15 ENCOUNTER — Other Ambulatory Visit: Payer: Self-pay

## 2019-09-15 ENCOUNTER — Telehealth: Payer: Self-pay | Admitting: Neurology

## 2019-09-15 DIAGNOSIS — G510 Bell's palsy: Secondary | ICD-10-CM

## 2019-09-15 NOTE — Telephone Encounter (Signed)
I ordered Mr/face at [facility], patient was not allowed in to [facility] cause of a mask issue, wanted to go somewhere else.

## 2019-09-15 NOTE — Telephone Encounter (Signed)
Patient called and said he was not able to complete his recent MRI because of problems he experienced at the facility when he went for the test.. He'd like to be referred or sent to another facility.

## 2019-09-22 ENCOUNTER — Ambulatory Visit: Payer: PRIVATE HEALTH INSURANCE | Admitting: Family Medicine

## 2019-09-22 ENCOUNTER — Encounter: Payer: Self-pay | Admitting: Family Medicine

## 2019-09-22 ENCOUNTER — Other Ambulatory Visit: Payer: Self-pay

## 2019-09-22 VITALS — BP 160/104 | HR 87 | Temp 98.5°F | Resp 16 | Ht 71.0 in | Wt 327.6 lb

## 2019-09-22 DIAGNOSIS — I10 Essential (primary) hypertension: Secondary | ICD-10-CM

## 2019-09-22 DIAGNOSIS — H8111 Benign paroxysmal vertigo, right ear: Secondary | ICD-10-CM | POA: Diagnosis not present

## 2019-09-22 DIAGNOSIS — R5383 Other fatigue: Secondary | ICD-10-CM | POA: Diagnosis not present

## 2019-09-22 DIAGNOSIS — T50905A Adverse effect of unspecified drugs, medicaments and biological substances, initial encounter: Secondary | ICD-10-CM | POA: Diagnosis not present

## 2019-09-22 MED ORDER — MECLIZINE HCL 25 MG PO TABS: 25.0000 mg | ORAL_TABLET | Freq: Three times a day (TID) | ORAL | 1 refills | Status: DC | PRN

## 2019-09-22 NOTE — Progress Notes (Signed)
OFFICE VISIT  09/22/2019   CC:  Chief Complaint  Patient presents with  . Elevated BP    HPI:    Patient is a 49 y.o. Caucasian male who presents for elevated blood pressure. Of note, started descovy for PreP approx 3 wks ago. Reports 10 d/a onset of severe fatigue.  Onset 5 d/a vertigo with turning and looking behind him to the right, also when sits up sometimes, gets nauseated with episodes but no vomiting, has some HA. Since onset has had it occur multiple times per day, feeling disequilibrium/lightheaded in between. He denies ringing in ears or change in hearing with this. No fevers or diarrhea.  Still eating and drinking ok.  Has never had vertigo before.  In response to these vertigo episodes he started checking bp's and they've been 160/90 ballpark consistently.  No low bp's. Amlodipine is the only bp med listed in "old meds" in EMR.  Looks like we took him off of this in the midst of some dizzy spells of unknown etiology back in June 2020. He says he has never been on any bp meds other than amlod in the past.  ROS: no fevers, no CP, no SOB, no wheezing, no cough, no rashes, no melena/hematochezia.  No polyuria or polydipsia.  No myalgias or arthralgias.   Past Medical History:  Diagnosis Date  . Adenoma of left adrenal gland    incidental on CT abd 10/2018.  Marland Kitchen Bell's palsy    L side.  Pain involved (?)--got better with gabapentin.  . Diabetes mellitus without complication ([place])   . Erosive gastritis 10/2018   EGD  . Esophageal stenosis 10/2018   dilation done  . High risk sexual behavior    sex with men: started PrEP (descovy) 08/15/19.  Marland Kitchen Hyperlipidemia    75), HDL 27, TG 105. LDL goal= <110  . Hypertension   . Morbid obesity ([place])   . NASH (nonalcoholic steatohepatitis)   . Rapid weight loss 2020   Purposeful  . Seasonal allergies    RAD with RTIs only    Past Surgical History:  Procedure Laterality Date  . ESOPHAGOGASTRODUODENOSCOPY  10/29/2018  .  LASIK  2000   Bilaterally  . TONSILLECTOMY      Outpatient Medications Prior to Visit  Medication Sig Dispense Refill  . emtricitabine-tenofovir AF (DESCOVY) 200-25 MG tablet Take 1 tablet by mouth daily. 90 tablet 1  . pantoprazole (PROTONIX) 40 MG tablet TAKE 1 TABLET BY MOUTH TWICE A DAY X30 DAYS THEN ONCE DAILY    . diazepam (VALIUM) 10 MG tablet Take 30-40 minutes prior to MRI. (Patient not taking: Reported on 09/22/2019) 1 tablet 0  . azithromycin (ZITHROMAX) 250 MG tablet Take 2 tabs the first day and then 1 tab daily until you run out. (Patient not taking: Reported on 09/22/2019) 6 tablet 0   No facility-administered medications prior to visit.    Allergies  Allergen Reactions  . Penicillins     Rash   . Sulfonamide Derivatives     REACTION: rash   . Sulfa Antibiotics Swelling    ROS As per HPI  PE: Initial bp today 160/104 automated.  Manual bp recheck at end of visit today was 140/90. Blood pressure (!) 160/104, pulse 87, temperature 98.5 F (36.9 C), temperature source Temporal, resp. rate 16, height 5\' 11"  (1.803 m), weight (!) 327 lb 9.6 oz (148.6 kg), SpO2 97 %. Body mass index is 45.69 kg/m.  Gen: Alert, well appearing.  Patient is oriented  to person, place, time, and situation. AFFECT: pleasant, lucid thought and speech. Walks ok after a little woozy feeling wears off after standing up. CV: RRR, no m/r/g.   LUNGS: CTA bilat, nonlabored resps, good aeration in all lung fields. Neuro: CN 2-12 intact bilaterally except chronic L sided upper and lower facial weakness, strength 5/5 in proximal and distal upper extremities and lower extremities bilaterally.   No tremor.  No ataxia.  Dix-Halpike: with head turned to R, vertigo,nausea and rotary nystag with fast beat to the right.  Lasts 15-20 sec.  Recurs when he sits back up for about 20 sec.  Neg with head turned to L.  LABS:    Chemistry      Component Value Date/Time   NA 139 08/13/2019 0758   NA 137  10/31/2018 0000   K 3.9 08/13/2019 0758   CL 101 08/13/2019 0758   CO2 30 08/13/2019 0758   BUN 10 08/13/2019 0758   BUN 11 10/31/2018 0000   CREATININE 0.72 08/13/2019 0758   CREATININE 0.85 01/19/2017 1609   GLU 105 10/31/2018 0000      Component Value Date/Time   CALCIUM 9.0 08/13/2019 0758   ALKPHOS 62 03/10/2019 1451   AST 20 03/10/2019 1451   ALT 18 03/10/2019 1451   BILITOT 1.3 (H) 03/10/2019 1451     Lab Results  Component Value Date   CHOL 147 11/13/2018   HDL 32.20 (L) 11/13/2018   LDLCALC 87 11/13/2018   LDLDIRECT 92.0 03/30/2015   TRIG 140.0 11/13/2018   CHOLHDL 5 11/13/2018   Lab Results  Component Value Date   TSH 0.70 10/28/2018   Lab Results  Component Value Date   HGBA1C 5.5 03/10/2019    IMPRESSION AND PLAN:  1) Fatigue: suspect this is side effect of his descovy started just prior to onset of the fatigue. Stop descovy and we'll see how he feels.  2) BPPV: right side.  Discussed dx, home epley maneuvers reviewed/handout given. Meclizine 25mg  q8h trial for disequilibrium feeling in between vertigo episodes.  3) Elev bp-->he has known HTN but we've been undertreating/off meds at times b/c of ? Of side effect or low bp's-->although home bp monitoring has not been done appropriately to dx much except periodic highs when he feels bad.  An After Visit Summary was printed and given to the patient.  FOLLOW UP: No follow-ups on file.  Next CPE 1-2 mo  Signed:  Crissie Sickles, MD           09/22/2019

## 2019-09-22 NOTE — Patient Instructions (Signed)
Stop Descovy.  Try meclizine for the dizziness feeling you have between episodes of vertigo.  Continue to monitor blood pressure and heart rate every morning and evening and write these down to review with me at next f/u in 1 wk.

## 2019-09-24 ENCOUNTER — Telehealth: Payer: Self-pay | Admitting: Neurology

## 2019-09-24 ENCOUNTER — Other Ambulatory Visit: Payer: Self-pay | Admitting: Neurology

## 2019-09-24 MED ORDER — DIAZEPAM 10 MG PO TABS: ORAL_TABLET | ORAL | 0 refills | Status: DC

## 2019-09-24 NOTE — Telephone Encounter (Signed)
I sent in another prescription for a Valium.  I wrote him a prescription for Valium last month.  He should make sure not to lose this because I will not be able to prescribe another one.  He will need a driver to and from the MRI.

## 2019-09-24 NOTE — Telephone Encounter (Signed)
Please advise 

## 2019-09-24 NOTE — Telephone Encounter (Signed)
Patient called and requested a prescription for valium to be taken prior to his MRI scheduled this Sunday, 09/28/19, at 8:30 AM.  [place]

## 2019-09-28 ENCOUNTER — Ambulatory Visit (HOSPITAL_COMMUNITY): Payer: PRIVATE HEALTH INSURANCE

## 2019-09-29 ENCOUNTER — Encounter: Payer: Self-pay | Admitting: Gastroenterology

## 2019-09-29 ENCOUNTER — Encounter: Payer: Self-pay | Admitting: Family Medicine

## 2019-09-29 ENCOUNTER — Ambulatory Visit (INDEPENDENT_AMBULATORY_CARE_PROVIDER_SITE_OTHER): Payer: PRIVATE HEALTH INSURANCE | Admitting: Family Medicine

## 2019-09-29 ENCOUNTER — Other Ambulatory Visit: Payer: Self-pay

## 2019-09-29 VITALS — BP 139/81 | HR 89 | Temp 98.7°F | Resp 16 | Ht 71.0 in | Wt 327.0 lb

## 2019-09-29 DIAGNOSIS — I10 Essential (primary) hypertension: Secondary | ICD-10-CM | POA: Diagnosis not present

## 2019-09-29 DIAGNOSIS — Z1211 Encounter for screening for malignant neoplasm of colon: Secondary | ICD-10-CM

## 2019-09-29 DIAGNOSIS — Z125 Encounter for screening for malignant neoplasm of prostate: Secondary | ICD-10-CM | POA: Diagnosis not present

## 2019-09-29 DIAGNOSIS — E7800 Pure hypercholesterolemia, unspecified: Secondary | ICD-10-CM | POA: Diagnosis not present

## 2019-09-29 DIAGNOSIS — Z0000 Encounter for general adult medical examination without abnormal findings: Secondary | ICD-10-CM | POA: Diagnosis not present

## 2019-09-29 DIAGNOSIS — E119 Type 2 diabetes mellitus without complications: Secondary | ICD-10-CM

## 2019-09-29 DIAGNOSIS — Z23 Encounter for immunization: Secondary | ICD-10-CM | POA: Diagnosis not present

## 2019-09-29 LAB — LIPID PANEL
Cholesterol: 166 mg/dL (ref 0–200)
HDL: 48.7 mg/dL (ref >39.00)
LDL Cholesterol: 102 mg/dL — ABNORMAL HIGH (ref 0–99)
NonHDL: 117.54
Total CHOL/HDL Ratio: 3
Triglycerides: 76 mg/dL (ref 0.0–149.0)
VLDL: 15.2 mg/dL (ref 0.0–40.0)

## 2019-09-29 LAB — PSA: PSA: 2.23 ng/mL (ref 0.10–4.00)

## 2019-09-29 LAB — MICROALBUMIN / CREATININE URINE RATIO
Creatinine,U: 149.1 mg/dL
Microalb Creat Ratio: 1.8 mg/g (ref 0.0–30.0)
Microalb, Ur: 2.8 mg/dL — ABNORMAL HIGH (ref 0.0–1.9)

## 2019-09-29 LAB — HEMOGLOBIN A1C: Hgb A1c MFr Bld: 5.7 % (ref 4.6–6.5)

## 2019-09-29 LAB — TSH: TSH: 1.71 u[IU]/mL (ref 0.35–4.50)

## 2019-09-29 MED ORDER — AMLODIPINE BESY-BENAZEPRIL HCL 5-10 MG PO CAPS: 1.0000 | ORAL_CAPSULE | Freq: Every day | ORAL | 0 refills | Status: DC

## 2019-09-29 NOTE — Addendum Note (Signed)
Addended by: Deveron Furlong D on: 09/29/2019 10:18 AM   Modules accepted: Orders

## 2019-09-29 NOTE — Progress Notes (Signed)
Office Note 09/29/2019  CC:  Chief Complaint  Patient presents with  . Annual Exam    pt is fasting    HPI:  Dwayne Lewis is a 48 y.o. White male who is here for annual health maintenance exam as well as 1 week f/u fatigue and vertigo. A/P as of his visit 1 week ago: "1) Fatigue: suspect this is side effect of his descovy started just prior to onset of the fatigue. Stop descovy and we'll see how he feels.  2) BPPV: right side.  Discussed dx, home epley maneuvers reviewed/handout given. Meclizine 25mg  q8h trial for disequilibrium feeling in between vertigo episodes.  3) Elev bp-->he has known HTN but we've been undertreating/off meds at times b/c of ? Of side effect or low bp's-->although home bp monitoring has not been done appropriately to dx much except periodic highs when he feels bad."  Interim hx: Vertigo is gone now.  He did Lewis Epley's maneuvers.  Home bp avg 150/90s Fatigue not much improved off descovy. He needs he has gained 23 lbs since 05/2019 and this may be contributing to his fatigue.     Past Medical History:  Diagnosis Date  . Adenoma of left adrenal gland    incidental on CT abd 10/2018.  Marland Kitchen Bell's palsy    L side.  Pain involved (?)--got better with gabapentin.  . Diabetes mellitus without complication ([place])   . Erosive gastritis 10/2018   EGD  . Esophageal stenosis 10/2018   dilation done  . High risk sexual behavior    sex with men: started PrEP (descovy) 08/15/19.  Marland Kitchen Hyperlipidemia    75), HDL 27, TG 105. LDL goal= <110  . Hypertension   . Morbid obesity ([place])   . NASH (nonalcoholic steatohepatitis)   . Rapid weight loss 2020   Purposeful  . Seasonal allergies    RAD with RTIs only    Past Surgical History:  Procedure Laterality Date  . ESOPHAGOGASTRODUODENOSCOPY  10/29/2018  . LASIK  2000   Bilaterally  . TONSILLECTOMY      Family History  Problem Relation Age of Onset  . Hypertension Mother   . Transient ischemic attack  Maternal Grandfather   . Lung cancer Maternal Grandmother   . COPD Maternal Grandmother        uncle   . Diabetes Other        MG aunt  . Colon cancer Neg Hx   . Prostate cancer Neg Hx   . CAD Neg Hx     Social History   Socioeconomic History  . Marital status: Single    Spouse name: Not on file  . Number of children: 0  . Years of education: Not on file  . Highest education level: Some college, no degree  Occupational History  . Occupation: works for a Art gallery manager time   Tobacco Use  . Smoking status: Never Smoker  . Smokeless tobacco: Never Used  Substance and Sexual Activity  . Alcohol use: Not Currently    Alcohol/week: 0.0 standard drinks  . Drug use: No  . Sexual activity: Not on file  Other Topics Concern  . Not on file  Social History Narrative   Single, no children.   Orig from [place].   Occup: works part time for [employer].   No tobacco.   No alcohol.      Lives w/ mother to help her out (mother is [name])   Social Determinants of Health  Financial Resource Strain:   . Difficulty of Paying Living Expenses: Not on file  Food Insecurity:   . Worried About Charity fundraiser in the Last Year: Not on file  . Ran Out of Food in the Last Year: Not on file  Transportation Needs:   . Lack of Transportation (Medical): Not on file  . Lack of Transportation (Non-Medical): Not on file  Physical Activity:   . Days of Exercise per Week: Not on file  . Minutes of Exercise per Session: Not on file  Stress:   . Feeling of Stress : Not on file  Social Connections:   . Frequency of Communication with Friends and Family: Not on file  . Frequency of Social Gatherings with Friends and Family: Not on file  . Attends Religious Services: Not on file  . Active Member of Clubs or Organizations: Not on file  . Attends Archivist Meetings: Not on file  . Marital Status: Not on file  Intimate Partner Violence:   . Fear of Current or  Ex-Partner: Not on file  . Emotionally Abused: Not on file  . Physically Abused: Not on file  . Sexually Abused: Not on file    Outpatient Medications Prior to Visit  Medication Sig Dispense Refill  . meclizine (ANTIVERT) 25 MG tablet Take 1 tablet (25 mg total) by mouth 3 (three) times daily as needed for dizziness. 10 tablet 1  . pantoprazole (PROTONIX) 40 MG tablet TAKE 1 TABLET BY MOUTH TWICE A DAY X30 DAYS THEN ONCE DAILY    . diazepam (VALIUM) 10 MG tablet Take 30 to 40 minutes prior to MRI (Patient not taking: Reported on 09/29/2019) 1 tablet 0  . emtricitabine-tenofovir AF (DESCOVY) 200-25 MG tablet Take 1 tablet by mouth daily. (Patient not taking: Reported on 09/29/2019) 90 tablet 1   No facility-administered medications prior to visit.    Allergies  Allergen Reactions  . Penicillins     Rash   . Sulfonamide Derivatives     REACTION: rash   . Sulfa Antibiotics Swelling    ROS Review of Systems  Constitutional: Positive for fatigue. Negative for appetite change, chills and fever.  HENT: Negative for congestion, dental problem, ear pain and sore throat.   Eyes: Negative for discharge, redness and visual disturbance.  Respiratory: Negative for cough, chest tightness, shortness of breath and wheezing.   Cardiovascular: Negative for chest pain, palpitations and leg swelling.  Gastrointestinal: Negative for abdominal pain, blood in stool, diarrhea, nausea and vomiting.  Genitourinary: Negative for difficulty urinating, dysuria, flank pain, frequency, hematuria and urgency.  Musculoskeletal: Negative for arthralgias, back pain, joint swelling, myalgias and neck stiffness.  Skin: Negative for pallor and rash.  Neurological: Negative for dizziness, speech difficulty, weakness and headaches.  Hematological: Negative for adenopathy. Does not bruise/bleed easily.  Psychiatric/Behavioral: Negative for confusion and sleep disturbance. The patient is not nervous/anxious.      PE; Blood pressure 139/81, pulse 89, temperature 98.7 F (37.1 C), temperature source Temporal, resp. rate 16, height 5\' 11"  (1.803 m), weight (!) 327 lb (148.3 kg), SpO2 97 %. Body mass index is 45.61 kg/m.  Gen: Alert, well appearing.  Patient is oriented to person, place, time, and situation. AFFECT: pleasant, lucid thought and speech. ENT: Ears: EACs clear, normal epithelium.  TMs with good light reflex and landmarks bilaterally.  Eyes: no injection, icteris, swelling, or exudate.  EOMI, PERRLA. Nose: no drainage or turbinate edema/swelling.  No injection or focal lesion.  Mouth: lips  without lesion/swelling.  Oral mucosa pink and moist.  Dentition intact and without obvious caries or gingival swelling.  Oropharynx without erythema, exudate, or swelling.  Neck: supple/nontender.  No LAD, mass, or TM.  Carotid pulses 2+ bilaterally, without bruits. CV: RRR, no m/r/g.   LUNGS: CTA bilat, nonlabored resps, good aeration in all lung fields. ABD: soft, NT, ND, BS normal.  No hepatospenomegaly or mass.  No bruits. EXT: no clubbing, cyanosis, or edema.  Musculoskeletal: no joint swelling, erythema, warmth, or tenderness.  ROM of all joints intact. Skin - no sores or suspicious lesions or rashes or color changes Rectal exam: negative without mass, lesions or tenderness, PROSTATE EXAM: smooth and symmetric without nodules or tenderness.   Pertinent labs:  Lab Results  Component Value Date   TSH 0.70 10/28/2018   Lab Results  Component Value Date   WBC 7.4 10/31/2018   HGB 14.3 10/31/2018   HCT 42 11/01/2018   MCV 83.0 10/24/2018   MCV 83.4 10/24/2018   PLT 166 10/31/2018   Lab Results  Component Value Date   CREATININE 0.72 08/13/2019   BUN 10 08/13/2019   NA 139 08/13/2019   K 3.9 08/13/2019   CL 101 08/13/2019   CO2 30 08/13/2019   Lab Results  Component Value Date   ALT 18 03/10/2019   AST 20 03/10/2019   ALKPHOS 62 03/10/2019   BILITOT 1.3 (H) 03/10/2019   Lab  Results  Component Value Date   CHOL 147 11/13/2018   Lab Results  Component Value Date   HDL 32.20 (L) 11/13/2018   Lab Results  Component Value Date   LDLCALC 87 11/13/2018   Lab Results  Component Value Date   TRIG 140.0 11/13/2018   Lab Results  Component Value Date   CHOLHDL 5 11/13/2018   Lab Results  Component Value Date   HGBA1C 5.5 03/10/2019    ASSESSMENT AND PLAN:   1) Fatigue: improved.  Needs to lose wt.  2) Vertigo: resolved BPPV.  3) HTN, untreated.  Needs to get back on bp med-->start lotrel 5-10, 1 qd. Continue bp/hr checks daily and we'll review these in office in 2 wks.  4) Health maintenance exam: Reviewed age and gender appropriate health maintenance issues (prudent diet, regular exercise, health risks of tobacco and excessive alcohol, use of seatbelts, fire alarms in home, use of sunscreen).  Also reviewed age and gender appropriate health screening as well as vaccine recommendations. Vaccines: Pneumovax->he'll consider it later.  Flu->declines.  Shingrix->#1 today. Labs: FLP, TSH, Hba1c, urine microalb/cr. Prostate ca screening: DRE normal today , PSA. Colon ca screening: refer to GI.  An After Visit Summary was printed and given to the patient.  FOLLOW UP:  Return in about 2 weeks (around 10/13/2019) for f/u HTN.  Signed:  Crissie Sickles, MD           09/29/2019

## 2019-09-30 ENCOUNTER — Telehealth: Payer: Self-pay | Admitting: Neurology

## 2019-09-30 ENCOUNTER — Encounter: Payer: Self-pay | Admitting: Family Medicine

## 2019-09-30 NOTE — Telephone Encounter (Signed)
Patient is calling in about how he is gonna be unable to do his MRI. Because it seems like a lot to have to do and then going through the hospital it is too much money. Just FYI. Thanks!

## 2019-09-30 NOTE — Telephone Encounter (Signed)
Please advise 

## 2019-10-01 NOTE — Telephone Encounter (Signed)
Please advise if testosterone needed to be checked, thanks.

## 2019-10-01 NOTE — Telephone Encounter (Signed)
If pt says his sex drive is significantly lacking then checking testosterone would be warranted.

## 2019-10-13 ENCOUNTER — Ambulatory Visit: Payer: PRIVATE HEALTH INSURANCE | Admitting: Family Medicine

## 2019-10-13 NOTE — Progress Notes (Deleted)
OFFICE VISIT  10/13/2019   CC: No chief complaint on file.  HPI:    Patient is a 49 y.o. Caucasian male who presents for 2 wk f/u HTN. A/P as of last visit: "HTN, untreated.  Needs to get back on bp med-->start lotrel 5-10, 1 qd. Continue bp/hr checks daily and we'll review these in office in 2 wks."  Interim hx: ***  Past Medical History:  Diagnosis Date  . Adenoma of left adrenal gland    incidental on CT abd 10/2018.  Marland Kitchen Bell's palsy    L side.  Pain involved (?)--got better with gabapentin.  . Diabetes mellitus without complication ([place])   . Erosive gastritis 10/2018   EGD  . Esophageal stenosis 10/2018   dilation done  . High risk sexual behavior    sex with men: started PrEP (descovy) 08/15/19.  Marland Kitchen Hyperlipidemia    75), HDL 27, TG 105. LDL goal= <110  . Hypertension   . Morbid obesity ([place])   . NASH (nonalcoholic steatohepatitis)   . Rapid weight loss 2020   Purposeful  . Seasonal allergies    RAD with RTIs only    Past Surgical History:  Procedure Laterality Date  . ESOPHAGOGASTRODUODENOSCOPY  10/29/2018  . LASIK  2000   Bilaterally  . TONSILLECTOMY      Outpatient Medications Prior to Visit  Medication Sig Dispense Refill  . amLODipine-benazepril (LOTREL) 5-10 MG capsule Take 1 capsule by mouth daily. 30 capsule 0  . diazepam (VALIUM) 10 MG tablet Take 30 to 40 minutes prior to MRI (Patient not taking: Reported on 09/29/2019) 1 tablet 0  . emtricitabine-tenofovir AF (DESCOVY) 200-25 MG tablet Take 1 tablet by mouth daily. (Patient not taking: Reported on 09/29/2019) 90 tablet 1  . meclizine (ANTIVERT) 25 MG tablet Take 1 tablet (25 mg total) by mouth 3 (three) times daily as needed for dizziness. 10 tablet 1  . pantoprazole (PROTONIX) 40 MG tablet TAKE 1 TABLET BY MOUTH TWICE A DAY X30 DAYS THEN ONCE DAILY     No facility-administered medications prior to visit.    Allergies  Allergen Reactions  . Penicillins     Rash   . Sulfonamide Derivatives    REACTION: rash   . Sulfa Antibiotics Swelling    ROS As per HPI  PE: There were no vitals taken for this visit. ***  LABS:  Lab Results  Component Value Date   TSH 1.71 09/29/2019   Lab Results  Component Value Date   WBC 7.4 10/31/2018   HGB 14.3 10/31/2018   HCT 42 11/01/2018   MCV 83.0 10/24/2018   MCV 83.4 10/24/2018   PLT 166 10/31/2018   Lab Results  Component Value Date   CREATININE 0.72 08/13/2019   BUN 10 08/13/2019   NA 139 08/13/2019   K 3.9 08/13/2019   CL 101 08/13/2019   CO2 30 08/13/2019   Lab Results  Component Value Date   ALT 18 03/10/2019   AST 20 03/10/2019   ALKPHOS 62 03/10/2019   BILITOT 1.3 (H) 03/10/2019   Lab Results  Component Value Date   CHOL 166 09/29/2019   Lab Results  Component Value Date   HDL 48.70 09/29/2019   Lab Results  Component Value Date   LDLCALC 102 (H) 09/29/2019   Lab Results  Component Value Date   TRIG 76.0 09/29/2019   Lab Results  Component Value Date   CHOLHDL 3 09/29/2019   Lab Results  Component Value Date  PSA 2.23 09/29/2019   Lab Results  Component Value Date   HGBA1C 5.7 09/29/2019    IMPRESSION AND PLAN:  No problem-specific Assessment & Plan notes found for this encounter.   An After Visit Summary was printed and given to the patient.  FOLLOW UP: No follow-ups on file.  Signed:  Crissie Sickles, MD           10/13/2019

## 2019-10-14 ENCOUNTER — Ambulatory Visit (AMBULATORY_SURGERY_CENTER): Payer: Self-pay | Admitting: *Deleted

## 2019-10-14 ENCOUNTER — Other Ambulatory Visit: Payer: Self-pay

## 2019-10-14 VITALS — Temp 97.6°F | Ht 71.0 in | Wt 330.0 lb

## 2019-10-14 DIAGNOSIS — Z1211 Encounter for screening for malignant neoplasm of colon: Secondary | ICD-10-CM

## 2019-10-14 DIAGNOSIS — Z01818 Encounter for other preprocedural examination: Secondary | ICD-10-CM

## 2019-10-14 MED ORDER — SUPREP BOWEL PREP KIT 17.5-3.13-1.6 GM/177ML PO SOLN: ORAL | 0 refills | Status: DC

## 2019-10-14 NOTE — Progress Notes (Signed)
Osvaldo Angst CRNA here to assess pt's airway- ok for [place]  Pt's mother with him- 97.5  Pt is aware that care partner will wait in the car during procedure; if they feel like they will be too hot or cold to wait in the car; they may wait in the 4 th floor lobby. Patient is aware to bring only one care partner. We want them to wear a mask (we do not have any that we can provide them), practice social distancing, and we will check their temperatures when they get here.  I did remind the patient that their care partner needs to stay in the parking lot the entire time and have a cell phone available, we will call them when the pt is ready for discharge. Patient will wear mask into building.  No egg or soy allergy  No home oxygen use or problems with anesthesia  No medications for weight loss taken  emmi information given  $!5 off Suprep coupon given  covid test 10-23-19 at 8:10 am

## 2019-10-17 ENCOUNTER — Encounter: Payer: Self-pay | Admitting: Gastroenterology

## 2019-10-22 ENCOUNTER — Other Ambulatory Visit: Payer: Self-pay | Admitting: Family Medicine

## 2019-10-22 NOTE — Telephone Encounter (Signed)
Patient has enough to last until appt on 2/15

## 2019-10-23 ENCOUNTER — Other Ambulatory Visit: Payer: Self-pay | Admitting: Gastroenterology

## 2019-10-23 ENCOUNTER — Ambulatory Visit (INDEPENDENT_AMBULATORY_CARE_PROVIDER_SITE_OTHER): Payer: PRIVATE HEALTH INSURANCE

## 2019-10-23 DIAGNOSIS — Z1159 Encounter for screening for other viral diseases: Secondary | ICD-10-CM

## 2019-10-23 LAB — SARS CORONAVIRUS 2 (TAT 6-24 HRS): SARS Coronavirus 2: NEGATIVE

## 2019-10-27 ENCOUNTER — Ambulatory Visit (INDEPENDENT_AMBULATORY_CARE_PROVIDER_SITE_OTHER): Payer: PRIVATE HEALTH INSURANCE | Admitting: Family Medicine

## 2019-10-27 ENCOUNTER — Encounter: Payer: PRIVATE HEALTH INSURANCE | Admitting: Gastroenterology

## 2019-10-27 ENCOUNTER — Ambulatory Visit (AMBULATORY_SURGERY_CENTER): Payer: PRIVATE HEALTH INSURANCE | Admitting: Gastroenterology

## 2019-10-27 ENCOUNTER — Encounter: Payer: Self-pay | Admitting: Family Medicine

## 2019-10-27 ENCOUNTER — Encounter: Payer: Self-pay | Admitting: Gastroenterology

## 2019-10-27 ENCOUNTER — Other Ambulatory Visit: Payer: Self-pay

## 2019-10-27 VITALS — BP 99/51 | HR 53 | Temp 95.9°F | Resp 13 | Ht 71.0 in | Wt 330.0 lb

## 2019-10-27 VITALS — BP 123/81 | HR 87 | Temp 98.4°F | Resp 16 | Ht 71.0 in | Wt 324.0 lb

## 2019-10-27 DIAGNOSIS — Z1211 Encounter for screening for malignant neoplasm of colon: Secondary | ICD-10-CM

## 2019-10-27 DIAGNOSIS — D124 Benign neoplasm of descending colon: Secondary | ICD-10-CM | POA: Diagnosis not present

## 2019-10-27 DIAGNOSIS — I10 Essential (primary) hypertension: Secondary | ICD-10-CM

## 2019-10-27 HISTORY — PX: COLONOSCOPY W/ POLYPECTOMY: SHX1380

## 2019-10-27 MED ORDER — AMLODIPINE BESY-BENAZEPRIL HCL 5-10 MG PO CAPS: 1.0000 | ORAL_CAPSULE | Freq: Every day | ORAL | 3 refills | Status: DC

## 2019-10-27 MED ORDER — SODIUM CHLORIDE 0.9 % IV SOLN: 500.0000 mL | Freq: Once | INTRAVENOUS | Status: DC

## 2019-10-27 NOTE — Progress Notes (Signed)
To PACU, VSS. Report to [email or web address] 

## 2019-10-27 NOTE — Progress Notes (Signed)
OFFICE VISIT  10/27/2019   CC:  Chief Complaint  Patient presents with  . Follow-up    hypertension, pt is fasting   HPI:    Patient is a 49 y.o. Caucasian male who presents for 2 wk f/u HTN. A/P as of last visit:  "HTN, untreated.  Needs to get back on bp med-->start lotrel 5-10, 1 qd. Continue bp/hr checks daily and we'll review these in office in 2 wks."  Interim hx: Home monitoring of bps': 120s-130s/70-80. No side effect from bp med. He feels well except for chronic fatigue.    Past Medical History:  Diagnosis Date  . Adenoma of left adrenal gland    incidental on CT abd 10/2018.  Marland Kitchen Bell's palsy    L side.  Pain involved (?)--got better with gabapentin.  . Diabetes mellitus without complication ([facility])    no meds, hx of  . Erosive gastritis 10/2018   EGD  . Esophageal stenosis 10/2018   dilation done  . High risk sexual behavior    sex with men: started PrEP (descovy) 08/15/19.  Marland Kitchen Hyperlipidemia    75), HDL 27, TG 105. LDL goal= <110, no per pt  . Hypertension   . Morbid obesity ([place])   . NASH (nonalcoholic steatohepatitis)   . Rapid weight loss 2020   Purposeful  . Seasonal allergies    RAD with RTIs only    Past Surgical History:  Procedure Laterality Date  . ESOPHAGOGASTRODUODENOSCOPY  10/29/2018  . LASIK  2000   Bilaterally  . TONSILLECTOMY    . UPPER GASTROINTESTINAL ENDOSCOPY      Outpatient Medications Prior to Visit  Medication Sig Dispense Refill  . emtricitabine-tenofovir AF (DESCOVY) 200-25 MG tablet Take 1 tablet by mouth daily. 90 tablet 1  . pantoprazole (PROTONIX) 40 MG tablet TAKE 1 TABLET BY MOUTH TWICE A DAY X30 DAYS THEN ONCE DAILY    . amLODipine-benazepril (LOTREL) 5-10 MG capsule Take 1 capsule by mouth daily. 30 capsule 0  . diazepam (VALIUM) 10 MG tablet Take 30 to 40 minutes prior to MRI (Patient not taking: Reported on 09/29/2019) 1 tablet 0  . meclizine (ANTIVERT) 25 MG tablet Take 1 tablet (25 mg total) by mouth 3 (three)  times daily as needed for dizziness. (Patient not taking: Reported on 10/27/2019) 10 tablet 1   No facility-administered medications prior to visit.    Allergies  Allergen Reactions  . Penicillins     Rash   . Sulfonamide Derivatives     REACTION: rash Tongue swelling  . Sulfa Antibiotics Swelling    Tongue swelling    ROS As per HPI  PE: Blood pressure 123/81, pulse 87, temperature 98.4 F (36.9 C), temperature source Temporal, resp. rate 16, height 5\' 11"  (1.803 m), weight (!) 324 lb (147 kg), SpO2 95 %. Gen: Alert, well appearing.  Patient is oriented to person, place, time, and situation. AFFECT: pleasant, lucid thought and speech. CV: RRR, no m/r/g.   LUNGS: CTA bilat, nonlabored resps, good aeration in all lung fields. EXT: no clubbing or cyanosis.  no edema.    LABS:    Chemistry      Component Value Date/Time   NA 139 08/13/2019 0758   NA 137 10/31/2018 0000   K 3.9 08/13/2019 0758   CL 101 08/13/2019 0758   CO2 30 08/13/2019 0758   BUN 10 08/13/2019 0758   BUN 11 10/31/2018 0000   CREATININE 0.72 08/13/2019 0758   CREATININE 0.85 01/19/2017 1609  GLU 105 10/31/2018 0000      Component Value Date/Time   CALCIUM 9.0 08/13/2019 0758   ALKPHOS 62 03/10/2019 1451   AST 20 03/10/2019 1451   ALT 18 03/10/2019 1451   BILITOT 1.3 (H) 03/10/2019 1451     Lab Results  Component Value Date   WBC 7.4 10/31/2018   HGB 14.3 10/31/2018   HCT 42 11/01/2018   MCV 83.0 10/24/2018   MCV 83.4 10/24/2018   PLT 166 10/31/2018   Lab Results  Component Value Date   CHOL 166 09/29/2019   HDL 48.70 09/29/2019   LDLCALC 102 (H) 09/29/2019   LDLDIRECT 92.0 03/30/2015   TRIG 76.0 09/29/2019   CHOLHDL 3 09/29/2019   Lab Results  Component Value Date   HGBA1C 5.7 09/29/2019   Lab Results  Component Value Date   TSH 1.71 09/29/2019   IMPRESSION AND PLAN:  HTN, well controlled. Continue lotrel 5-10 qd.   An After Visit Summary was printed and given to the  patient.  FOLLOW UP: Return in about 6 months (around 04/25/2020) for routine chronic illness f/u.  Signed:  Crissie Sickles, MD           10/27/2019

## 2019-10-27 NOTE — Patient Instructions (Signed)
No aspirin, ibuprofen, naproxen, or other non-steroidal anti-inflammatory drugs for 2 weeks after polyp removal.   Handouts provided on polyps and hemorrhoids.   YOU HAD AN ENDOSCOPIC PROCEDURE TODAY AT [facility]:   Refer to the procedure report that was given to you for any specific questions about what was found during the examination.  If the procedure report does not answer your questions, please call your gastroenterologist to clarify.  If you requested that your care partner not be given the details of your procedure findings, then the procedure report has been included in a sealed envelope for you to review at your convenience later.  YOU SHOULD EXPECT: Some feelings of bloating in the abdomen. Passage of more gas than usual.  Walking can help get rid of the air that was put into your GI tract during the procedure and reduce the bloating. If you had a lower endoscopy (such as a colonoscopy or flexible sigmoidoscopy) you may notice spotting of blood in your stool or on the toilet paper. If you underwent a bowel prep for your procedure, you may not have a normal bowel movement for a few days.  Please Note:  You might notice some irritation and congestion in your nose or some drainage.  This is from the oxygen used during your procedure.  There is no need for concern and it should clear up in a day or so.  SYMPTOMS TO REPORT IMMEDIATELY:   Following lower endoscopy (colonoscopy or flexible sigmoidoscopy):  Excessive amounts of blood in the stool  Significant tenderness or worsening of abdominal pains  Swelling of the abdomen that is new, acute  Fever of 100F or higher   For urgent or emergent issues, a gastroenterologist can be reached at any hour by calling [phone].   DIET:  We do recommend a small meal at first, but then you may proceed to your regular diet.  Drink plenty of fluids but you should avoid alcoholic beverages for 24 hours.  ACTIVITY:  You should  plan to take it easy for the rest of today and you should NOT DRIVE or use heavy machinery until tomorrow (because of the sedation medicines used during the test).    FOLLOW UP: Our staff will call the number listed on your records 48-72 hours following your procedure to check on you and address any questions or concerns that you may have regarding the information given to you following your procedure. If we do not reach you, we will leave a message.  We will attempt to reach you two times.  During this call, we will ask if you have developed any symptoms of COVID 19. If you develop any symptoms (ie: fever, flu-like symptoms, shortness of breath, cough etc.) before then, please call [phone].  If you test positive for Covid 19 in the 2 weeks post procedure, please call and report this information to [place].    If any biopsies were taken you will be contacted by phone or by letter within the next 1-3 weeks.  Please call us at [phone] if you have not heard about the biopsies in 3 weeks.    SIGNATURES/CONFIDENTIALITY: You and/or your care partner have signed paperwork which will be entered into your electronic medical record.  These signatures attest to the fact that that the information above on your After Visit Summary has been reviewed and is understood.  Full responsibility of the confidentiality of this discharge information lies with you and/or your care-partner.

## 2019-10-27 NOTE — Addendum Note (Signed)
Addended by: Jess Barters L on: 10/27/2019 11:15 AM   Modules accepted: Orders

## 2019-10-27 NOTE — Op Note (Signed)
[place] Patient Name: Keola Koker Procedure Date: 10/27/2019 8:32 AM MRN: [id number] Endoscopist: Ladene Artist , MD Age: 49 Referring MD:  Date of Birth: 1971-01-02 Gender: Male Account #: [id number] Procedure:                Colonoscopy Indications:              Screening for colorectal malignant neoplasm Medicines:                Monitored Anesthesia Care Procedure:                Pre-Anesthesia Assessment:                           - Prior to the procedure, a History and Physical                            was performed, and patient medications and                            allergies were reviewed. The patient's tolerance of                            previous anesthesia was also reviewed. The risks                            and benefits of the procedure and the sedation                            options and risks were discussed with the patient.                            All questions were answered, and informed consent                            was obtained. Prior Anticoagulants: The patient has                            taken no previous anticoagulant or antiplatelet                            agents. ASA Grade Assessment: II - A patient with                            mild systemic disease. After reviewing the risks                            and benefits, the patient was deemed in                            satisfactory condition to undergo the procedure.                           After obtaining informed consent, the colonoscope  was passed under direct vision. Throughout the                            procedure, the patient's blood pressure, pulse, and                            oxygen saturations were monitored continuously. The                            Colonoscope was introduced through the anus and                            advanced to the the cecum, identified by                            appendiceal orifice and  ileocecal valve. The                            ileocecal valve, appendiceal orifice, and rectum                            were photographed. The quality of the bowel                            preparation was excellent. The colonoscopy was                            performed without difficulty. The patient tolerated                            the procedure well. Scope In: 8:40:46 AM Scope Out: 8:52:00 AM Scope Withdrawal Time: 0 hours 9 minutes 52 seconds  Total Procedure Duration: 0 hours 11 minutes 14 seconds  Findings:                 The perianal and digital rectal examinations were                            normal.                           A 10 mm polyp was found in the descending colon.                            The polyp was pedunculated. The polyp was removed                            with a hot snare. Resection and retrieval were                            complete.                           Internal hemorrhoids were found during  retroflexion. The hemorrhoids were small and Grade                            I (internal hemorrhoids that do not prolapse).                           The exam was otherwise without abnormality on                            direct and retroflexion views. Complications:            No immediate complications. Estimated blood loss:                            None. Estimated Blood Loss:     Estimated blood loss: none. Impression:               - One 10 mm polyp in the descending colon, removed                            with a hot snare. Resected and retrieved.                           - Internal hemorrhoids.                           - The examination was otherwise normal on direct                            and retroflexion views. Recommendation:           - Repeat colonoscopy after studies are complete for                            surveillance based on pathology results.                           - Patient has a  contact number available for                            emergencies. The signs and symptoms of potential                            delayed complications were discussed with the                            patient. Return to normal activities tomorrow.                            Written discharge instructions were provided to the                            patient.                           - Resume previous diet.                           -  Continue present medications.                           - Await pathology results.                           - No aspirin, ibuprofen, naproxen, or other                            non-steroidal anti-inflammatory drugs for 2 weeks                            after polyp removal. Ladene Artist, MD 10/27/2019 8:55:43 AM This report has been signed electronically.

## 2019-10-27 NOTE — Progress Notes (Signed)
Called to room to assist during endoscopic procedure.  Patient ID and intended procedure confirmed with present staff. Received instructions for my participation in the procedure from the performing physician.  

## 2019-10-27 NOTE — Progress Notes (Signed)
Pt's states no medical or surgical changes since previsit or office visit. 

## 2019-10-28 ENCOUNTER — Encounter: Payer: Self-pay | Admitting: Family Medicine

## 2019-10-29 ENCOUNTER — Telehealth: Payer: Self-pay | Admitting: *Deleted

## 2019-10-29 ENCOUNTER — Encounter: Payer: Self-pay | Admitting: Gastroenterology

## 2019-10-29 NOTE — Telephone Encounter (Signed)
  Follow up Call-  Call back number 10/27/2019  Post procedure Call Back phone  # [phone]  Permission to leave phone message Yes  Some recent data might be hidden     Patient questions:  Do you have a fever, pain , or abdominal swelling? No. Pain Score  0 *  Have you tolerated food without any problems? Yes.    Have you been able to return to your normal activities? Yes.    Do you have any questions about your discharge instructions: Diet   No. Medications  No. Follow up visit  No.  Do you have questions or concerns about your Care? No.  Actions: * If pain score is 4 or above: No action needed, pain <4.  1. Have you developed a fever since your procedure? no  2.   Have you had an respiratory symptoms (SOB or cough) since your procedure? no  3.   Have you tested positive for COVID 19 since your procedure no  4.   Have you had any family members/close contacts diagnosed with the COVID 19 since your procedure?  no   If yes to any of these questions please route to Joylene John, RN and Alphonsa Gin, Therapist, sports.

## 2019-11-04 ENCOUNTER — Encounter: Payer: Self-pay | Admitting: Family Medicine

## 2019-11-13 ENCOUNTER — Telehealth: Payer: Self-pay

## 2019-11-13 NOTE — Telephone Encounter (Signed)
Per Hinton Dyer at the [facility] office, the pt called in today stating the following information:  Patient:  Dwayne Lewis MRN      [id number] [place]       09/29/19 PCP        [place]  Patient disputing $20 copay charged with his CPE appt  on 09/29/19 with Dr. Anitra Lauth.   Patient can be reached at [phone].   I called the pt and LMOVM to let him know the co-pay was generated because in addition the the CPE he had, Dr. Anitra Lauth addressed a one week follow up for fatigue and vertigo.

## 2019-11-27 ENCOUNTER — Ambulatory Visit: Payer: PRIVATE HEALTH INSURANCE

## 2019-11-28 ENCOUNTER — Ambulatory Visit: Payer: PRIVATE HEALTH INSURANCE | Attending: Internal Medicine

## 2019-11-28 DIAGNOSIS — Z20822 Contact with and (suspected) exposure to covid-19: Secondary | ICD-10-CM

## 2019-11-29 LAB — NOVEL CORONAVIRUS, NAA: SARS-CoV-2, NAA: NOT DETECTED

## 2019-12-11 ENCOUNTER — Other Ambulatory Visit: Payer: Self-pay

## 2019-12-11 ENCOUNTER — Ambulatory Visit (INDEPENDENT_AMBULATORY_CARE_PROVIDER_SITE_OTHER): Payer: PRIVATE HEALTH INSURANCE | Admitting: Family Medicine

## 2019-12-11 ENCOUNTER — Encounter: Payer: Self-pay | Admitting: Family Medicine

## 2019-12-11 VITALS — BP 140/88 | Wt 337.0 lb

## 2019-12-11 DIAGNOSIS — J069 Acute upper respiratory infection, unspecified: Secondary | ICD-10-CM

## 2019-12-11 DIAGNOSIS — Z20822 Contact with and (suspected) exposure to covid-19: Secondary | ICD-10-CM | POA: Diagnosis not present

## 2019-12-11 NOTE — Progress Notes (Signed)
Virtual Visit via Video Note  I connected with pt on 12/11/19 at  4:00 PM EDT by a video enabled telemedicine application and verified that I am speaking with the correct person using two identifiers.  Location patient: home Location provider:work or home office Persons participating in the virtual visit: patient, provider  I discussed the limitations of evaluation and management by telemedicine and the availability of in person appointments. The patient expressed understanding and agreed to proceed.  Telemedicine visit is a necessity given the COVID-19 restrictions in place at the current time.  HPI: 49 y/o WM being seen today for cough. Onset yesterday: Nasal cong, ST, HA, dry cough, some tightness in chest when he lies flat.  Mildly achy. Tmax today 100.  Sense of taste and smell intact.  +Tired. Appetite ok.  Mild nausea last night but no vomiting.  No nausea today. No diarrhea. No meds tried today. Possible exposure to someone with covid at work last week.   ROS: See pertinent positives and negatives per HPI.  Past Medical History:  Diagnosis Date  . Adenoma of left adrenal gland    incidental on CT abd 10/2018.  Marland Kitchen Bell's palsy    L side.  Pain involved (?)--got better with gabapentin.  . Diabetes mellitus without complication (HCC)    no meds, hx of  . Erosive gastritis 10/2018   EGD  . Esophageal stenosis 10/2018   dilation done  . High risk sexual behavior    sex with men: started PrEP (descovy) 08/15/19.  Marland Kitchen Hyperlipidemia    75), HDL 27, TG 105. LDL goal= <110, no per pt  . Hypertension   . Morbid obesity ([place])   . NASH (nonalcoholic steatohepatitis)   . Rapid weight loss 2020   Purposeful  . Seasonal allergies    RAD with RTIs only    Past Surgical History:  Procedure Laterality Date  . COLONOSCOPY W/ POLYPECTOMY  10/27/2019   Polyp x 1->submucosal lipoma.  Recall 10 yrs.  . ESOPHAGOGASTRODUODENOSCOPY  10/29/2018  . LASIK  2000   Bilaterally  .  TONSILLECTOMY    . UPPER GASTROINTESTINAL ENDOSCOPY      Family History  Problem Relation Age of Onset  . Hypertension Mother   . Transient ischemic attack Maternal Grandfather   . Lung cancer Maternal Grandmother   . COPD Maternal Grandmother        uncle   . Diabetes Other        MG aunt  . Colon cancer Neg Hx   . Prostate cancer Neg Hx   . CAD Neg Hx   . Esophageal cancer Neg Hx   . Stomach cancer Neg Hx   . Rectal cancer Neg Hx    Social History   Socioeconomic History  . Marital status: Single    Spouse name: Not on file  . Number of children: 0  . Years of education: Not on file  . Highest education level: Some college, no degree  Occupational History  . Occupation: works for a Art gallery manager time   Tobacco Use  . Smoking status: Never Smoker  . Smokeless tobacco: Never Used  Substance and Sexual Activity  . Alcohol use: Not Currently    Alcohol/week: 0.0 standard drinks  . Drug use: No  . Sexual activity: Not on file  Other Topics Concern  . Not on file  Social History Narrative   Single, no children.   Orig from [place].   Occup: works part time for [employer].   No tobacco.   No alcohol.      Lives w/ mother to help her out (mother is [name])   Social Determinants of Health   Financial Resource Strain:   . Difficulty of Paying Living Expenses:   Food Insecurity:   . Worried About Charity fundraiser in the Last Year:   . Arboriculturist in the Last Year:   Transportation Needs:   . Film/video editor (Medical):   Marland Kitchen Lack of Transportation (Non-Medical):   Physical Activity:   . Days of Exercise per Week:   . Minutes of Exercise per Session:   Stress:   . Feeling of Stress :   Social Connections:   . Frequency of Communication with Friends and Family:   . Frequency of Social Gatherings with Friends and Family:   . Attends Religious Services:   . Active Member of Clubs or Organizations:   . Attends Theatre manager Meetings:   Marland Kitchen Marital Status:       Current Outpatient Medications:  .  amLODipine-benazepril (LOTREL) 5-10 MG capsule, Take 1 capsule by mouth daily., Disp: 90 capsule, Rfl: 3 .  emtricitabine-tenofovir AF (DESCOVY) 200-25 MG tablet, Take 1 tablet by mouth daily., Disp: 90 tablet, Rfl: 1 .  pantoprazole (PROTONIX) 40 MG tablet, TAKE 1 TABLET BY MOUTH TWICE A DAY X30 DAYS THEN ONCE DAILY, Disp: , Rfl:  .  diazepam (VALIUM) 10 MG tablet, Take 30 to 40 minutes prior to MRI (Patient not taking: Reported on 09/29/2019), Disp: 1 tablet, Rfl: 0 .  meclizine (ANTIVERT) 25 MG tablet, Take 1 tablet (25 mg total) by mouth 3 (three) times daily as needed for dizziness. (Patient not taking: Reported on 10/27/2019), Disp: 10 tablet, Rfl: 1  EXAM:  VITALS per patient if applicable: BP [id number] (BP Location: Left Arm, Patient Position: Sitting, Cuff Size: Large)   Wt (!) 337 lb (152.9 kg)   BMI 47.00 kg/m    GENERAL: alert, oriented, appears well and in no acute distress  HEENT: atraumatic, conjunttiva clear, no obvious abnormalities on inspection of external nose and ears  NECK: normal movements of the head and neck  LUNGS: on inspection no signs of respiratory distress, breathing rate appears normal, no obvious gross SOB, gasping or wheezing  CV: no obvious cyanosis  MS: moves all visible extremities without noticeable abnormality  PSYCH/NEURO: pleasant and cooperative, no obvious depression or anxiety, speech and thought processing grossly intact  LABS: none today    Chemistry      Component Value Date/Time   NA 139 08/13/2019 0758   NA 137 10/31/2018 0000   K 3.9 08/13/2019 0758   CL 101 08/13/2019 0758   CO2 30 08/13/2019 0758   BUN 10 08/13/2019 0758   BUN 11 10/31/2018 0000   CREATININE 0.72 08/13/2019 0758   CREATININE 0.85 01/19/2017 1609   GLU 105 10/31/2018 0000      Component Value Date/Time   CALCIUM 9.0 08/13/2019 0758   ALKPHOS 62 03/10/2019 1451   AST  20 03/10/2019 1451   ALT 18 03/10/2019 1451   BILITOT 1.3 (H) 03/10/2019 1451     Lab Results  Component Value Date   WBC 7.4 10/31/2018   HGB 14.3 10/31/2018   HCT 42 11/01/2018   MCV 83.0 10/24/2018   MCV 83.4 10/24/2018   PLT 166 10/31/2018   Lab Results  Component Value Date   TSH 1.71 09/29/2019   Lab Results  Component Value Date   HGBA1C 5.7 09/29/2019    ASSESSMENT AND PLAN:  Discussed the following assessment and plan:  Viral resp infection, recent exposure to covid. Discussed symptomatic care: recommended tylenol/motrin q6h prn fever or body aches, push fluids, rest. Signs/symptoms to call or return or seek emergency care for were reviewed and pt expressed understanding. Gave pt covid testing site information b/c he says his employer will require him to have the test done.  -we discussed possible serious and likely etiologies, options for evaluation and workup, limitations of telemedicine visit vs in person visit, treatment, treatment risks and precautions. Pt prefers to treat via telemedicine empirically rather then risking or undertaking an in person visit at this moment. Patient agrees to seek prompt in person care if worsening, new symptoms arise, or if is not improving with treatment.   I discussed the assessment and treatment plan with the patient. The patient was provided an opportunity to ask questions and all were answered. The patient agreed with the plan and demonstrated an understanding of the instructions.   The patient was advised to call back or seek an in-person evaluation if the symptoms worsen or if the condition fails to improve as anticipated.  F/u: if not improving appropriately  Signed:  Crissie Sickles, MD           12/11/2019

## 2019-12-12 ENCOUNTER — Ambulatory Visit: Payer: PRIVATE HEALTH INSURANCE | Attending: Internal Medicine

## 2019-12-12 DIAGNOSIS — Z20822 Contact with and (suspected) exposure to covid-19: Secondary | ICD-10-CM

## 2019-12-13 LAB — NOVEL CORONAVIRUS, NAA: SARS-CoV-2, NAA: NOT DETECTED

## 2019-12-13 LAB — SARS-COV-2, NAA 2 DAY TAT

## 2019-12-15 ENCOUNTER — Telehealth: Payer: Self-pay

## 2019-12-15 NOTE — Telephone Encounter (Signed)
Work note completed, placed up front for pick up. Patient notified.

## 2019-12-15 NOTE — Telephone Encounter (Signed)
Covid test NEG 3 d/a.  Yes, pls do work note with requested dates that patient specified.  Ok to return 12/16/19.

## 2019-12-15 NOTE — Telephone Encounter (Signed)
Patient picked up 12/15/19

## 2019-12-15 NOTE — Telephone Encounter (Signed)
Patient had virtual visit on 3/31, 4/1, 4/2 and today, 4/5 and okay to return to work on 4/6.   Okay for work note?

## 2019-12-15 NOTE — Telephone Encounter (Signed)
Patient is requesting work note for 3/31, 4/1, 4/2, & 4/5. Patient would like to pick up note today so that he may return to work 12/16/19.

## 2019-12-18 ENCOUNTER — Telehealth: Payer: Self-pay

## 2019-12-18 NOTE — Telephone Encounter (Signed)
Patient will pick up COVID test results from front desk 12/19/19. MyChart will not let him print. Printed and ready.

## 2020-02-04 ENCOUNTER — Ambulatory Visit (INDEPENDENT_AMBULATORY_CARE_PROVIDER_SITE_OTHER): Payer: PRIVATE HEALTH INSURANCE | Admitting: Family Medicine

## 2020-02-04 ENCOUNTER — Other Ambulatory Visit: Payer: Self-pay

## 2020-02-04 DIAGNOSIS — Z23 Encounter for immunization: Secondary | ICD-10-CM

## 2020-03-01 IMAGING — CT CT NECK W/ CM
4 of 5 series · 14 of 33 positions shown, 16 images · IV contrast (iopamidol)
Comparison: None.

CLINICAL DATA: Left neck mass. Facial paresis. Difficulty
swallowing and shortness of breath for 1 week.

EXAM:
CT NECK WITH CONTRAST
TECHNIQUE: Multidetector CT imaging of the neck was performed using the
standard protocol following the bolus administration of intravenous
contrast.
CONTRAST:  100mL 18JVJB-KGG IOPAMIDOL (18JVJB-KGG) INJECTION 61%

[Series 3: axial neck · axial · 0.58mm/px · z∈[+964,+1156]mm · 4 of 162 slices shown, 5 images]
[im 33/162  soft-tissue]
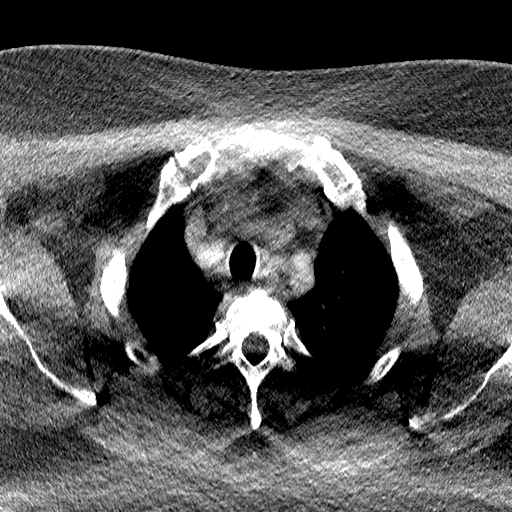
[im 33/162  bone]
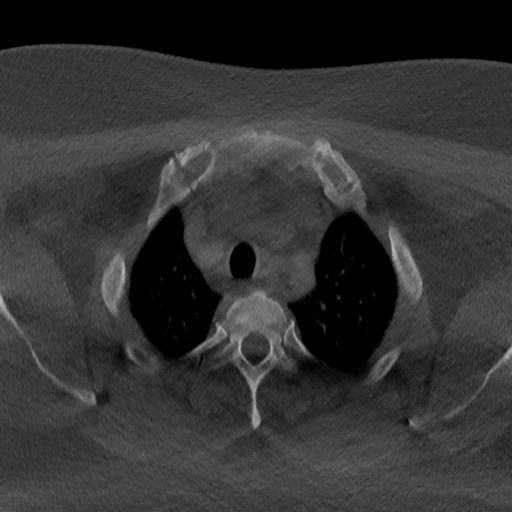
[im 65/162  bone]
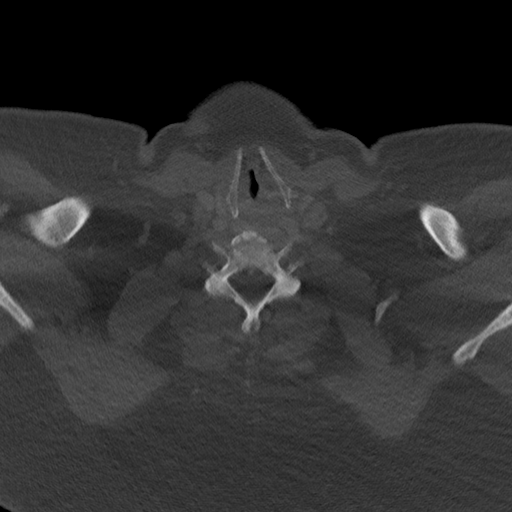
[im 97/162  bone]
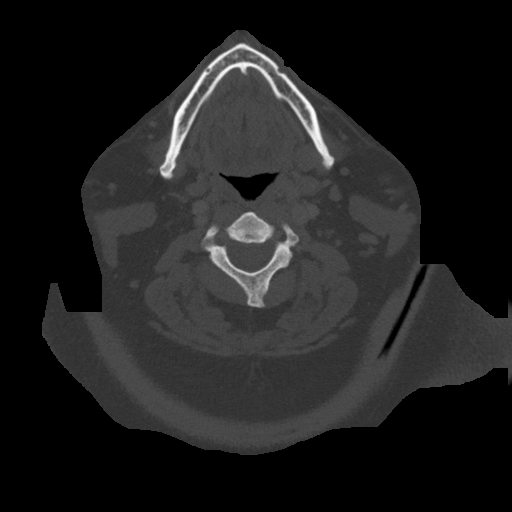
[im 129/162  bone]
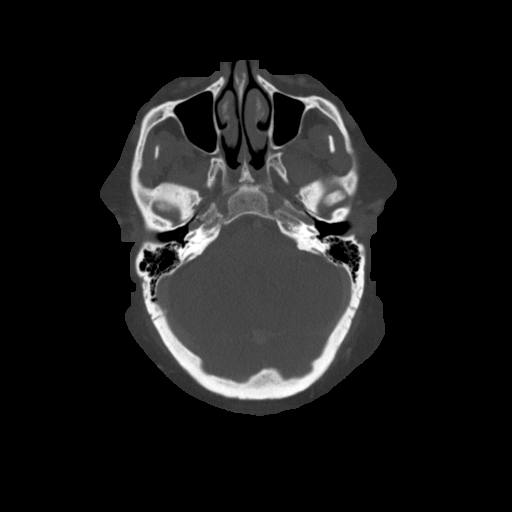

[Series 7: sag neck · sagittal · 0.66mm/px · 5 of 116 slices shown, 6 images]
[im 39/116  bone]
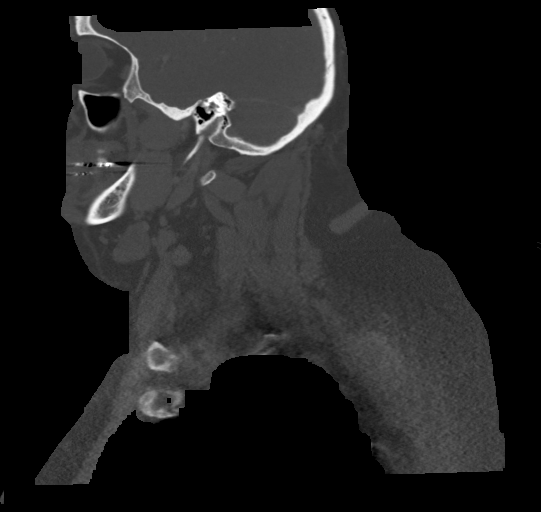
[im 48/116  bone]
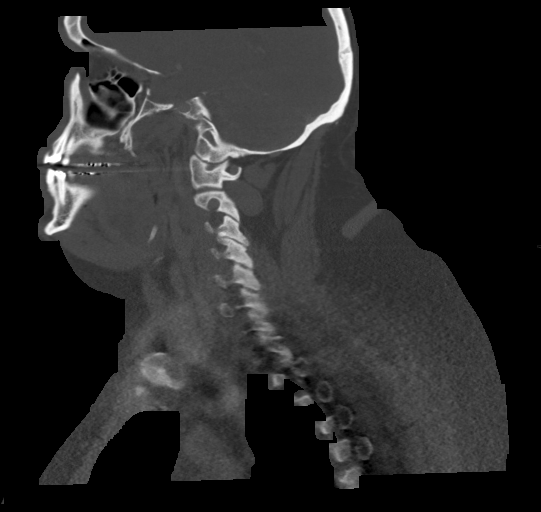
[im 58/116  soft-tissue]
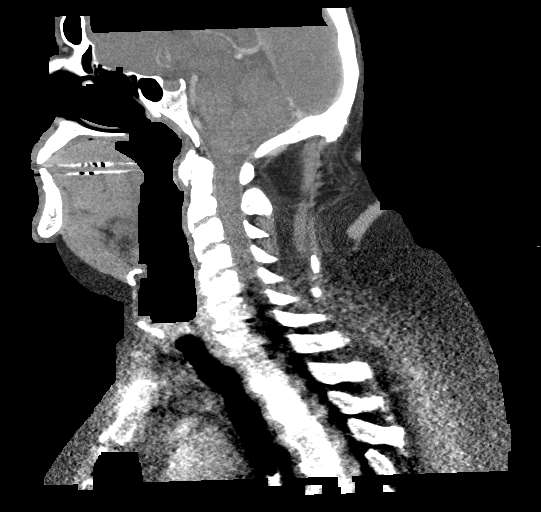
[im 58/116  bone]
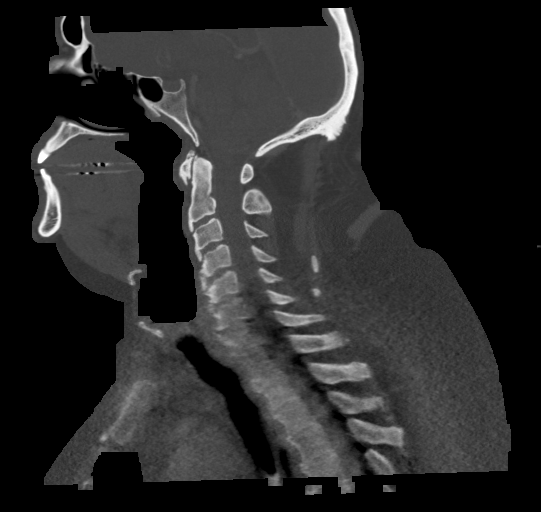
[im 68/116  bone]
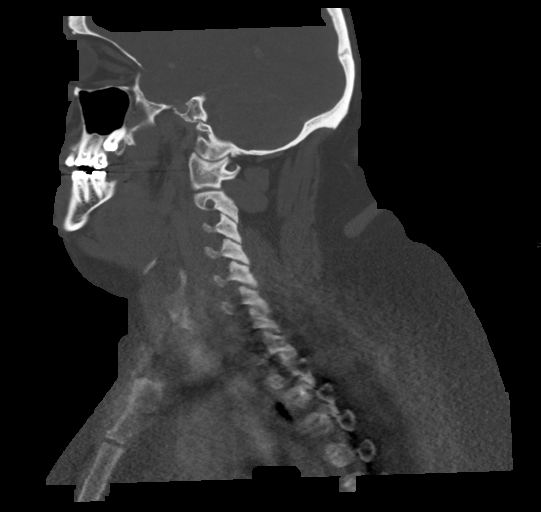
[im 77/116  bone]
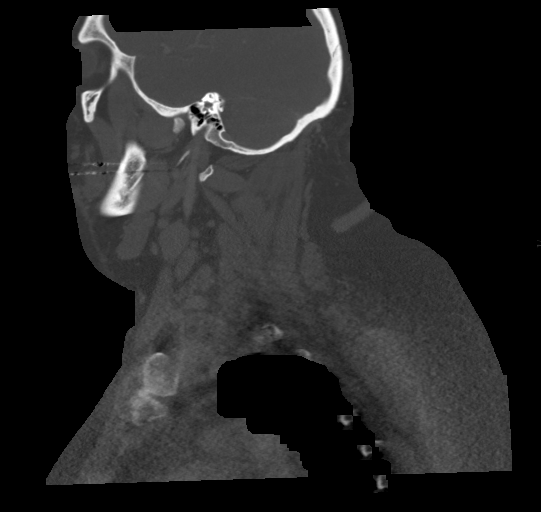

[Series 8: cor neck · coronal · 0.45mm/px · 3 of 180 slices shown]
[im 36/180  bone]
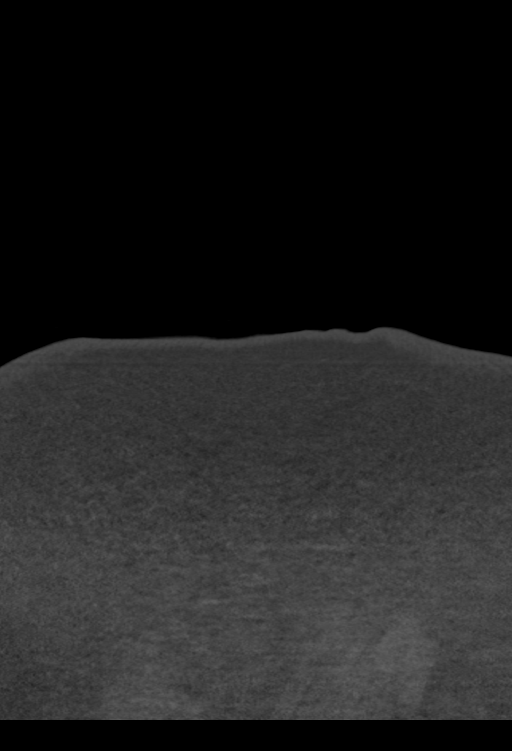
[im 72/180  bone]
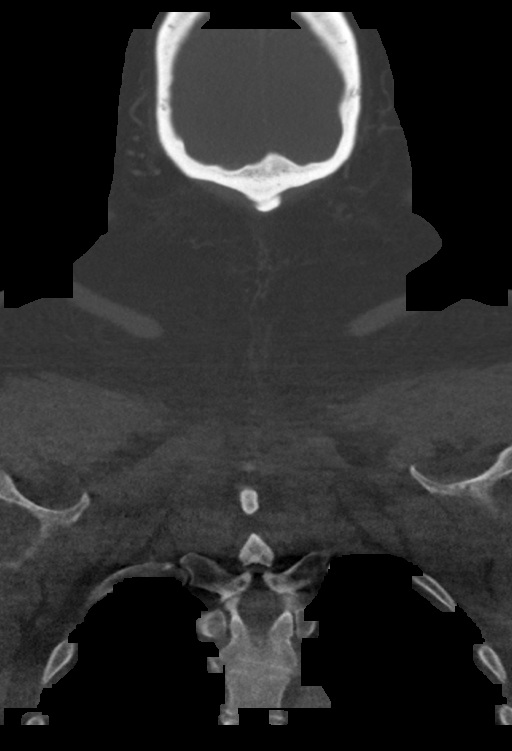
[im 108/180  bone]
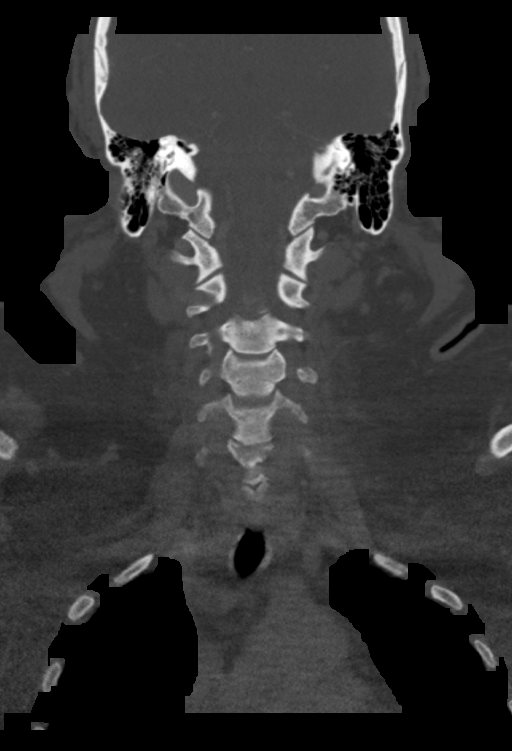

[Series 9: orthogonal ax · axial · 0.45mm/px · z∈[+966,+1032]mm · 2 of 168 slices shown]
[im 34/168  bone]
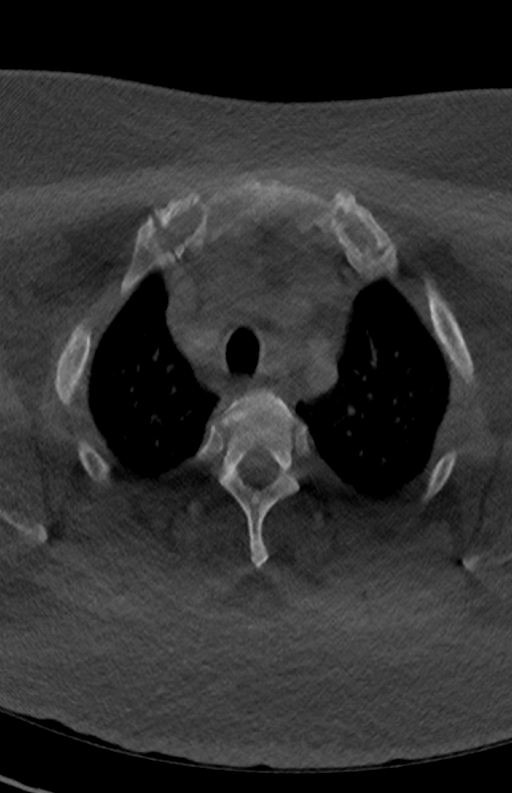
[im 67/168  bone]
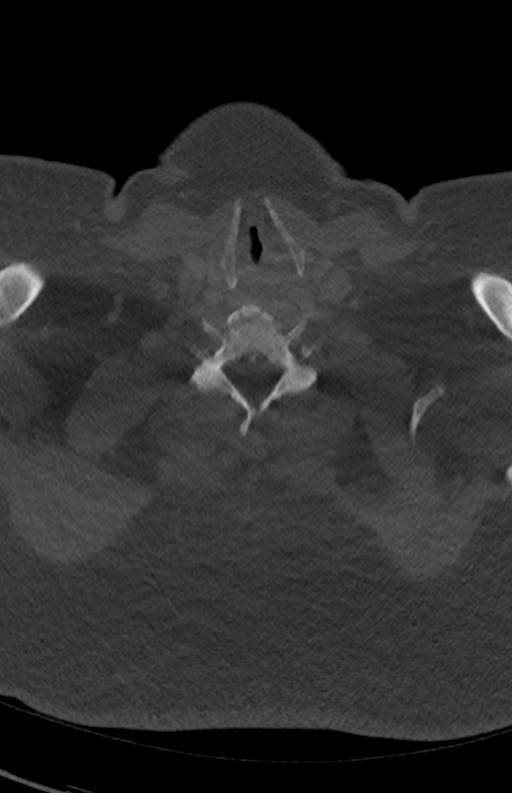

[14 of 33 positions shown; findings below may reference images not displayed]

FINDINGS: Pharynx and larynx: Symmetric pharyngeal soft tissues without
evidence of mass. No fluid or inflammatory changes in the
retropharyngeal space. Patent airway.

Salivary glands: The parotid glands are prominent in size in a
symmetric fashion without underlying mass or regional inflammatory
changes. The submandibular glands are unremarkable.

Thyroid: Grossly unremarkable with assessment limited by patient
body habitus.

Lymph nodes: Enlarged left-sided cervical lymph nodes measure up to
11 mm in short axis in level IB, 19 mm in level IIA, and 15 mm in
level III. An enlarged 12 mm right level III lymph node is noted.

Vascular: Unremarkable.

Limited intracranial: Unremarkable.

Visualized orbits: Unremarkable orbits.

Mastoids and visualized paranasal sinuses: Clear.

Skeleton: Moderate cervical spondylosis. No suspicious osseous
lesion.

Upper chest: Clear lung apices.

Other: None.
IMPRESSION: Left greater than right cervical lymphadenopathy. While a
reactive/inflammatory etiology is possible given the acute
presentation, malignancy including lymphoproliferative disease is a
concern given the size and location of the left-sided lymph nodes.

## 2020-04-28 ENCOUNTER — Ambulatory Visit: Payer: PRIVATE HEALTH INSURANCE | Admitting: Family Medicine

## 2020-05-10 ENCOUNTER — Ambulatory Visit: Payer: PRIVATE HEALTH INSURANCE | Admitting: Family Medicine

## 2020-09-29 ENCOUNTER — Telehealth: Payer: Self-pay

## 2020-09-29 NOTE — Telephone Encounter (Signed)
Sorry but no

## 2020-09-29 NOTE — Telephone Encounter (Signed)
[facility] calling regarding son (DPR), Aqib Lough. Mary asked if Roen could transfer back to Dr. Anitra Lauth. Stanton Kidney said that Keron moved and would like to see Dr. Anitra Lauth. I told Stanton Kidney that Dr. Anitra Lauth is no longer accepting new patients but since he was a prior patient of Dr. Idelle Leech, I could send a message to Dr. Anitra Lauth to ask if he would accept him back as a patient. After reviewing his chart, I found that Artem had cancelled an appointment scheduled with Dr. Anitra Lauth on 05/10/2020. The reason was documented as  Canceled Programmer, systems).  Because of the reason I will be sending message to Practice Administrator, Dorna Bloom and Dr. Anitra Lauth.  I told Stanton Kidney we would follow up with Legrand Como. She gave me his cell number.  [phone]

## 2020-09-30 NOTE — Telephone Encounter (Signed)
Patient advised and voiced understanding.  

## 2021-06-10 ENCOUNTER — Other Ambulatory Visit: Payer: Self-pay

## 2021-06-10 ENCOUNTER — Ambulatory Visit (INDEPENDENT_AMBULATORY_CARE_PROVIDER_SITE_OTHER): Payer: BC Managed Care – PPO | Admitting: Registered Nurse

## 2021-06-10 ENCOUNTER — Encounter: Payer: Self-pay | Admitting: Registered Nurse

## 2021-06-10 VITALS — BP 155/74 | HR 89 | Temp 98.5°F | Resp 18 | Ht 71.0 in | Wt 364.0 lb

## 2021-06-10 DIAGNOSIS — K219 Gastro-esophageal reflux disease without esophagitis: Secondary | ICD-10-CM

## 2021-06-10 DIAGNOSIS — E119 Type 2 diabetes mellitus without complications: Secondary | ICD-10-CM | POA: Diagnosis not present

## 2021-06-10 DIAGNOSIS — I10 Essential (primary) hypertension: Secondary | ICD-10-CM

## 2021-06-10 DIAGNOSIS — R5382 Chronic fatigue, unspecified: Secondary | ICD-10-CM

## 2021-06-10 DIAGNOSIS — E559 Vitamin D deficiency, unspecified: Secondary | ICD-10-CM | POA: Diagnosis not present

## 2021-06-10 DIAGNOSIS — Z79899 Other long term (current) drug therapy: Secondary | ICD-10-CM

## 2021-06-10 DIAGNOSIS — E7800 Pure hypercholesterolemia, unspecified: Secondary | ICD-10-CM | POA: Diagnosis not present

## 2021-06-10 DIAGNOSIS — G510 Bell's palsy: Secondary | ICD-10-CM

## 2021-06-10 DIAGNOSIS — R7989 Other specified abnormal findings of blood chemistry: Secondary | ICD-10-CM

## 2021-06-10 LAB — CBC WITH DIFFERENTIAL/PLATELET
Basophils Absolute: 0 10*3/uL (ref 0.0–0.1)
Basophils Relative: 0.6 % (ref 0.0–3.0)
Eosinophils Absolute: 0.1 10*3/uL (ref 0.0–0.7)
Eosinophils Relative: 1.1 % (ref 0.0–5.0)
HCT: 40.8 % (ref 39.0–52.0)
Hemoglobin: 12.9 g/dL — ABNORMAL LOW (ref 13.0–17.0)
Lymphocytes Relative: 24 % (ref 12.0–46.0)
Lymphs Abs: 1.3 10*3/uL (ref 0.7–4.0)
MCHC: 31.6 g/dL (ref 30.0–36.0)
MCV: 75.8 fl — ABNORMAL LOW (ref 78.0–100.0)
Monocytes Absolute: 0.8 10*3/uL (ref 0.1–1.0)
Monocytes Relative: 14.8 % — ABNORMAL HIGH (ref 3.0–12.0)
Neutro Abs: 3.1 10*3/uL (ref 1.4–7.7)
Neutrophils Relative %: 59.5 % (ref 43.0–77.0)
Platelets: 136 10*3/uL — ABNORMAL LOW (ref 150.0–400.0)
RBC: 5.38 Mil/uL (ref 4.22–5.81)
RDW: 16.7 % — ABNORMAL HIGH (ref 11.5–15.5)
WBC: 5.2 10*3/uL (ref 4.0–10.5)

## 2021-06-10 LAB — COMPREHENSIVE METABOLIC PANEL
ALT: 20 U/L (ref 0–53)
AST: 22 U/L (ref 0–37)
Albumin: 4.2 g/dL (ref 3.5–5.2)
Alkaline Phosphatase: 85 U/L (ref 39–117)
BUN: 10 mg/dL (ref 6–23)
CO2: 29 mEq/L (ref 19–32)
Calcium: 9 mg/dL (ref 8.4–10.5)
Chloride: 99 mEq/L (ref 96–112)
Creatinine, Ser: 0.75 mg/dL (ref 0.40–1.50)
GFR: 103.69 mL/min (ref >60.00)
Glucose, Bld: 95 mg/dL (ref 70–99)
Potassium: 3.8 mEq/L (ref 3.5–5.1)
Sodium: 138 mEq/L (ref 135–145)
Total Bilirubin: 1 mg/dL (ref 0.2–1.2)
Total Protein: 7.1 g/dL (ref 6.0–8.3)

## 2021-06-10 LAB — LIPID PANEL
Cholesterol: 137 mg/dL (ref 0–200)
HDL: 31 mg/dL — ABNORMAL LOW (ref >39.00)
LDL Cholesterol: 73 mg/dL (ref 0–99)
NonHDL: 105.79
Total CHOL/HDL Ratio: 4
Triglycerides: 162 mg/dL — ABNORMAL HIGH (ref 0.0–149.0)
VLDL: 32.4 mg/dL (ref 0.0–40.0)

## 2021-06-10 LAB — HEMOGLOBIN A1C: Hgb A1c MFr Bld: 6.1 % (ref 4.6–6.5)

## 2021-06-10 LAB — TESTOSTERONE: Testosterone: 185.12 ng/dL — ABNORMAL LOW (ref 300.00–890.00)

## 2021-06-10 LAB — VITAMIN D 25 HYDROXY (VIT D DEFICIENCY, FRACTURES): VITD: 20.51 ng/mL — ABNORMAL LOW (ref 30.00–100.00)

## 2021-06-10 MED ORDER — PANTOPRAZOLE SODIUM 40 MG PO TBEC: DELAYED_RELEASE_TABLET | ORAL | 3 refills | Status: DC

## 2021-06-10 MED ORDER — AMLODIPINE BESY-BENAZEPRIL HCL 5-10 MG PO CAPS: 1.0000 | ORAL_CAPSULE | Freq: Every day | ORAL | 3 refills | Status: DC

## 2021-06-10 MED ORDER — DESCOVY 200-25 MG PO TABS: 1.0000 | ORAL_TABLET | Freq: Every day | ORAL | 11 refills | Status: DC

## 2021-06-10 NOTE — Patient Instructions (Addendum)
Dwayne Lewis -   Doristine Devoid to meet you  Let's get MRI done. Then neuro  Labs today will be back tomorrow. Can coordinate testosterone therapy with endocrinology. I'll have them call you  Blood pressure is still high - I want you to start on amlodipine-benazepril. If your pressure drops, let me know  I'll be in touch soon  Thanks,  Rich     If you have lab work done today you will be contacted with your lab results within the next 2 weeks.  If you have not heard from [place] then please contact us. The fastest way to get your results is to register for My Chart.   IF you received an x-ray today, you will receive an invoice from [facility]. Please contact [facility] at [phone] with questions or concerns regarding your invoice.   IF you received labwork today, you will receive an invoice from [facility]. Please contact [facility] at [phone] with questions or concerns regarding your invoice.   Our billing staff will not be able to assist you with questions regarding bills from these companies.  You will be contacted with the lab results as soon as they are available. The fastest way to get your results is to activate your My Chart account. Instructions are located on the last page of this paperwork. If you have not heard from [place] regarding the results in 2 weeks, please contact this office.

## 2021-06-10 NOTE — Progress Notes (Signed)
New Patient Office Visit  Subjective:  Patient ID: Dwayne Lewis, male    DOB: 03-13-1971  Age: 50 y.o. MRN: [id number]  CC:  Chief Complaint  Patient presents with   New Patient (Initial Visit)    Patient states he is here to establish care. Patient states he would like to discuss health issues.    HPI Dwayne Lewis presents for visit to est care.   Histories reviewed and updated with patient.   Hx of Bell's Palsy - still some ongoing face pain. A lot of lower branch of trigeminal nerve pain. Some paralysis on L side. Worst on lower aspect, but all three aspects affected. Unfortunately had trouble getting MRI face covered/completed. Would like to retry this.  Low testosterone - been on at home injections in past. Has beeen off of this for some time. Would be interested to continue.   MSM - on PrEP, descovy, tolerates well. Hopes to continue. Notes that he gets this from [facility] [facility] in [place].   Past Medical History:  Diagnosis Date   Adenoma of left adrenal gland    incidental on CT abd 10/2018.   Bell's palsy    L side.  Pain involved (?)--got better with gabapentin.   Diabetes mellitus without complication (HCC)    no meds, hx of   Erosive gastritis 10/2018   EGD   Esophageal stenosis 10/2018   dilation done   High risk sexual behavior    sex with men: started PrEP (descovy) 08/15/19.   Hyperlipidemia    75), HDL 27, TG 105. LDL goal= <110, no per pt   Hypertension    Morbid obesity ([place])    NASH (nonalcoholic steatohepatitis)    Rapid weight loss 2020   Purposeful   Seasonal allergies    RAD with RTIs only    Past Surgical History:  Procedure Laterality Date   COLONOSCOPY W/ POLYPECTOMY  10/27/2019   Polyp x 1->submucosal lipoma.  Recall 10 yrs.   ESOPHAGOGASTRODUODENOSCOPY  10/29/2018   LASIK  2000   Bilaterally   TONSILLECTOMY     UPPER GASTROINTESTINAL ENDOSCOPY  2020    Family History  Problem Relation Age of Onset   Hypertension Mother     Lung cancer Maternal Grandmother    COPD Maternal Grandmother        uncle    Heart failure Maternal Grandfather    Transient ischemic attack Maternal Grandfather    Hypertension Other    Diabetes Other        MG aunt   Colon cancer Neg Hx    Prostate cancer Neg Hx    CAD Neg Hx    Esophageal cancer Neg Hx    Stomach cancer Neg Hx    Rectal cancer Neg Hx     Social History   Socioeconomic History   Marital status: Single    Spouse name: Not on file   Number of children: 0   Years of education: Not on file   Highest education level: Some college, no degree  Occupational History   Occupation: works for a Art gallery manager time   Tobacco Use   Smoking status: Never   Smokeless tobacco: Never  Vaping Use   Vaping Use: Never used  Substance and Sexual Activity   Alcohol use: Not Currently    Alcohol/week: 0.0 standard drinks   Drug use: No   Sexual activity: Not Currently  Other Topics Concern   Not on file  Social History Narrative  Single, no children.   Orig from [place].   Occup: works part time for [employer].   No tobacco.   No alcohol.      Lives w/ mother to help her out (mother is [name])   Social Determinants of Health   Financial Resource Strain: Not on file  Food Insecurity: Not on file  Transportation Needs: Not on file  Physical Activity: Not on file  Stress: Not on file  Social Connections: Not on file  Intimate Partner Violence: Not on file    ROS Review of Systems  Objective:   Today's Vitals: BP (!) 155/74   Pulse 89   Temp 98.5 F (36.9 C) (Temporal)   Resp 18   Ht 5\' 11"  (1.803 m)   Wt (!) 364 lb (165.1 kg)   SpO2 99%   BMI 50.77 kg/m   Physical Exam  Assessment & Plan:   Problem List Items Addressed This Visit   None Visit Diagnoses     Gastroesophageal reflux disease, unspecified whether esophagitis present    -  Primary   Relevant Medications   pantoprazole (PROTONIX) 40 MG tablet   Other  Relevant Orders   Thyroid Panel With TSH   Essential hypertension       Relevant Medications   amLODipine-benazepril (LOTREL) 5-10 MG capsule   Other Relevant Orders   Thyroid Panel With TSH   CBC with Differential/Platelet (Completed)   Comprehensive metabolic panel (Completed)   Low testosterone       Relevant Orders   Thyroid Panel With TSH   Testosterone (Completed)   Ambulatory referral to Endocrinology   Hypercholesterolemia       Relevant Medications   amLODipine-benazepril (LOTREL) 5-10 MG capsule   Other Relevant Orders   Thyroid Panel With TSH   Lipid panel (Completed)   Chronic fatigue       Relevant Orders   Thyroid Panel With TSH   Vitamin D deficiency       Relevant Orders   Vitamin D (25 hydroxy) (Completed)   Diabetes mellitus without complication (HCC)       Relevant Medications   amLODipine-benazepril (LOTREL) 5-10 MG capsule   Other Relevant Orders   Hemoglobin A1c (Completed)   On pre-exposure prophylaxis for HIV       Relevant Medications   emtricitabine-tenofovir AF (DESCOVY) 200-25 MG tablet   Cranial nerve VII palsy       Relevant Orders   MR FACE/TRIGEMINAL W/CM       Outpatient Encounter Medications as of 06/10/2021  Medication Sig   [DISCONTINUED] emtricitabine-tenofovir AF (DESCOVY) 200-25 MG tablet Take 1 tablet by mouth daily.   [DISCONTINUED] pantoprazole (PROTONIX) 40 MG tablet TAKE 1 TABLET BY MOUTH TWICE A DAY X30 DAYS THEN ONCE DAILY   amLODipine-benazepril (LOTREL) 5-10 MG capsule Take 1 capsule by mouth daily.   emtricitabine-tenofovir AF (DESCOVY) 200-25 MG tablet Take 1 tablet by mouth daily.   pantoprazole (PROTONIX) 40 MG tablet TAKE 1 TABLET BY MOUTH TWICE A DAY X30 DAYS THEN ONCE DAILY   [DISCONTINUED] amLODipine-benazepril (LOTREL) 5-10 MG capsule Take 1 capsule by mouth daily.   [DISCONTINUED] diazepam (VALIUM) 10 MG tablet Take 30 to 40 minutes prior to MRI (Patient not taking: Reported on 09/29/2019)   [DISCONTINUED]  meclizine (ANTIVERT) 25 MG tablet Take 1 tablet (25 mg total) by mouth 3 (three) times daily as needed for dizziness. (Patient not taking: Reported on 10/27/2019)   No facility-administered encounter medications on file as  of 06/10/2021.    Follow-up: Return in about 3 months (around 09/09/2021) for htn, palsy, t2dm.   PLAN BP elevated. Will restart amlodipine benazepril as above. Refill descovy Refill pantoprazole. Order MRI face. Will refer pending results. Labs collected. Will follow up with the patient as warranted. Patient encouraged to call clinic with any questions, comments, or concerns.  Maximiano Coss, NP

## 2021-06-11 LAB — THYROID PANEL WITH TSH
Free Thyroxine Index: 2.2 (ref 1.4–3.8)
T3 Uptake: 33 % (ref 22–35)
T4, Total: 6.7 ug/dL (ref 4.9–10.5)
TSH: 1.56 mIU/L (ref 0.40–4.50)

## 2021-06-13 ENCOUNTER — Telehealth: Payer: Self-pay | Admitting: Registered Nurse

## 2021-06-13 NOTE — Telephone Encounter (Signed)
Patient has a MRI on 06/24/21 and needs something to calm his nerves while having test performed. Please advise if something can be prescribed.

## 2021-06-13 NOTE — Telephone Encounter (Signed)
Pt is scheduled for a MRI on 06/24/21, he wanted to know if Dwayne Lewis was going to prescribe him something to help claim his nerves while doing the MRI?   Pt can be reached at the home #   Pt uses [facility] in [facility]

## 2021-06-20 ENCOUNTER — Encounter: Payer: Self-pay | Admitting: Registered Nurse

## 2021-06-20 ENCOUNTER — Other Ambulatory Visit: Payer: Self-pay | Admitting: Registered Nurse

## 2021-06-20 DIAGNOSIS — F40240 Claustrophobia: Secondary | ICD-10-CM

## 2021-06-20 MED ORDER — CLONAZEPAM 0.5 MG PO TABS: 0.5000 mg | ORAL_TABLET | Freq: Two times a day (BID) | ORAL | 0 refills | Status: DC | PRN

## 2021-06-20 NOTE — Telephone Encounter (Signed)
Please advise 

## 2021-06-20 NOTE — Telephone Encounter (Signed)
Have addressed with patient Sent clonazepam  Thanks,  Denice Paradise

## 2021-06-24 ENCOUNTER — Telehealth: Payer: Self-pay

## 2021-06-24 ENCOUNTER — Other Ambulatory Visit: Payer: Self-pay

## 2021-06-24 ENCOUNTER — Ambulatory Visit (HOSPITAL_COMMUNITY)
Admission: RE | Admit: 2021-06-24 | Discharge: 2021-06-24 | Disposition: A | Discharge status: Home / Self Care | Payer: BC Managed Care – PPO | Source: Ambulatory Visit | Attending: Registered Nurse | Admitting: Registered Nurse

## 2021-06-24 ENCOUNTER — Encounter (HOSPITAL_COMMUNITY): Payer: Self-pay

## 2021-06-24 DIAGNOSIS — G510 Bell's palsy: Secondary | ICD-10-CM

## 2021-06-24 NOTE — Progress Notes (Addendum)
Pt arrived to MRI for exam. Pt safety screened and placed on MRI scan table. Upon setting up pt for the exam, the head coil required for imaging was unable to accommodate pt's body habitus. Multiple angles and orientations were used in hopes to accommodate but were unsuccessful. Unable to accomplish scan.

## 2021-06-24 NOTE — Telephone Encounter (Signed)
Please advise regarding scan

## 2021-06-24 NOTE — Telephone Encounter (Signed)
Caller name:Christen Scoggins  On DPR? :Yes  Call back number:[phone]  Provider they see: Maximiano Coss NP    Reason for call:Pt went to have MRI done and mask would not fit his head in the machine and several techs tried ways. They asked him to reach back to his doctor to see what else could be ordered. Such at CT scan etc.

## 2021-06-27 NOTE — Telephone Encounter (Signed)
Will contact patient to discuss further steps  Thanks  Rich

## 2021-06-28 ENCOUNTER — Encounter: Payer: Self-pay | Admitting: Registered Nurse

## 2021-06-28 NOTE — Telephone Encounter (Signed)
Richard,   I saw where you ordered this back in September and patient cancelled and the referral/order was closed. Can you reorder this so that patient can have the MRI done. Let me know if you need me to do anything else.   Thanks

## 2021-07-01 ENCOUNTER — Other Ambulatory Visit: Payer: Self-pay | Admitting: Registered Nurse

## 2021-07-01 DIAGNOSIS — G510 Bell's palsy: Secondary | ICD-10-CM

## 2021-08-09 ENCOUNTER — Other Ambulatory Visit: Payer: Self-pay

## 2021-08-09 ENCOUNTER — Encounter: Payer: Self-pay | Admitting: Registered Nurse

## 2021-08-09 ENCOUNTER — Ambulatory Visit (INDEPENDENT_AMBULATORY_CARE_PROVIDER_SITE_OTHER): Payer: BC Managed Care – PPO | Admitting: Registered Nurse

## 2021-08-09 VITALS — BP 140/76 | HR 87 | Temp 98.1°F | Resp 18 | Ht 71.0 in | Wt 361.6 lb

## 2021-08-09 DIAGNOSIS — G43709 Chronic migraine without aura, not intractable, without status migrainosus: Secondary | ICD-10-CM

## 2021-08-09 MED ORDER — BUTALBITAL-APAP-CAFFEINE 50-325-40 MG PO TABS: 1.0000 | ORAL_TABLET | Freq: Four times a day (QID) | ORAL | 0 refills | Status: DC | PRN

## 2021-08-09 MED ORDER — SUMATRIPTAN SUCCINATE 50 MG PO TABS: 50.0000 mg | ORAL_TABLET | ORAL | 0 refills | Status: DC | PRN

## 2021-08-09 MED ORDER — PROPRANOLOL HCL ER 60 MG PO CP24: 60.0000 mg | ORAL_CAPSULE | Freq: Every day | ORAL | 0 refills | Status: DC

## 2021-08-09 NOTE — Progress Notes (Addendum)
Established Patient Office Visit  Subjective:  Patient ID: Dwayne Lewis, male    DOB: 09-19-1970  Age: 50 y.o. MRN: [id number]  CC:  Chief Complaint  Patient presents with   Headache    Patient states he has been having migraines again patient states he has been having to leave work because of the migraines.    HPI Dwayne Lewis presents for headaches  Migraine type. Has had these in past - has been some time, around 20 years.  Had started again March - intermittent More frequent since end of July Has interfered with ability to work. Has been taking tylenol or excedrin migraine without relief. Only relief is from lying down in cool dark area.   More nausea with vomiting with severe migraines lately  Has been on medications in past, unsure of which medications. Has been 15+ years. This was with Dr. Unice Cobble   No preceding aura, but occ blurred vision with intense migraines.   Past Medical History:  Diagnosis Date   Adenoma of left adrenal gland    incidental on CT abd 10/2018.   Bell's palsy    L side.  Pain involved (?)--got better with gabapentin.   Diabetes mellitus without complication (HCC)    no meds, hx of   Erosive gastritis 10/2018   EGD   Esophageal stenosis 10/2018   dilation done   High risk sexual behavior    sex with men: started PrEP (descovy) 08/15/19.   Hyperlipidemia    75), HDL 27, TG 105. LDL goal= <110, no per pt   Hypertension    Morbid obesity ([place])    NASH (nonalcoholic steatohepatitis)    Rapid weight loss 2020   Purposeful   Seasonal allergies    RAD with RTIs only    Past Surgical History:  Procedure Laterality Date   COLONOSCOPY W/ POLYPECTOMY  10/27/2019   Polyp x 1->submucosal lipoma.  Recall 10 yrs.   ESOPHAGOGASTRODUODENOSCOPY  10/29/2018   LASIK  2000   Bilaterally   TONSILLECTOMY     UPPER GASTROINTESTINAL ENDOSCOPY  2020    Family History  Problem Relation Age of Onset   Hypertension Mother    Lung  cancer Maternal Grandmother    COPD Maternal Grandmother        uncle    Heart failure Maternal Grandfather    Transient ischemic attack Maternal Grandfather    Hypertension Other    Diabetes Other        MG aunt   Colon cancer Neg Hx    Prostate cancer Neg Hx    CAD Neg Hx    Esophageal cancer Neg Hx    Stomach cancer Neg Hx    Rectal cancer Neg Hx     Social History   Socioeconomic History   Marital status: Single    Spouse name: Not on file   Number of children: 0   Years of education: Not on file   Highest education level: Some college, no degree  Occupational History   Occupation: works for a Art gallery manager time   Tobacco Use   Smoking status: Never   Smokeless tobacco: Never  Vaping Use   Vaping Use: Never used  Substance and Sexual Activity   Alcohol use: Not Currently    Alcohol/week: 0.0 standard drinks   Drug use: No   Sexual activity: Not Currently  Other Topics Concern   Not on file  Social History Narrative   Single, no children.  Orig from [place].   Occup: works part time for [employer].   No tobacco.   No alcohol.      Lives w/ mother to help her out (mother is [name])   Social Determinants of Health   Financial Resource Strain: Not on file  Food Insecurity: Not on file  Transportation Needs: Not on file  Physical Activity: Not on file  Stress: Not on file  Social Connections: Not on file  Intimate Partner Violence: Not on file    Outpatient Medications Prior to Visit  Medication Sig Dispense Refill   amLODipine-benazepril (LOTREL) 5-10 MG capsule Take 1 capsule by mouth daily. 90 capsule 3   clonazePAM (KLONOPIN) 0.5 MG tablet Take 1-2 tablets (0.5-1 mg total) by mouth 2 (two) times daily as needed for anxiety. 10 tablet 0   emtricitabine-tenofovir AF (DESCOVY) 200-25 MG tablet Take 1 tablet by mouth daily. 30 tablet 11   pantoprazole (PROTONIX) 40 MG tablet TAKE 1 TABLET BY MOUTH TWICE A DAY X30 DAYS THEN ONCE  DAILY 90 tablet 3   No facility-administered medications prior to visit.    Allergies  Allergen Reactions   Penicillins     Rash    Sulfonamide Derivatives     REACTION: rash Tongue swelling   Sulfa Antibiotics Swelling    Tongue swelling    ROS Review of Systems  Constitutional: Negative.   HENT: Negative.    Eyes: Negative.   Respiratory: Negative.    Cardiovascular: Negative.   Gastrointestinal: Negative.   Genitourinary: Negative.   Musculoskeletal: Negative.   Skin: Negative.   Neurological: Negative.   Psychiatric/Behavioral: Negative.    All other systems reviewed and are negative.    Objective:    Physical Exam Constitutional:      General: He is not in acute distress.    Appearance: Normal appearance. He is normal weight. He is not ill-appearing, toxic-appearing or diaphoretic.  Eyes:     General: No visual field deficit. Cardiovascular:     Rate and Rhythm: Normal rate and regular rhythm.     Heart sounds: Normal heart sounds. No murmur heard.   No friction rub. No gallop.  Pulmonary:     Effort: Pulmonary effort is normal. No respiratory distress.     Breath sounds: Normal breath sounds. No stridor. No wheezing, rhonchi or rales.  Chest:     Chest wall: No tenderness.  Neurological:     General: No focal deficit present.     Mental Status: He is alert and oriented to person, place, and time. Mental status is at baseline.     GCS: GCS eye subscore is 4. GCS verbal subscore is 5. GCS motor subscore is 6.     Cranial Nerves: No dysarthria.     Sensory: No sensory deficit.     Motor: No weakness.     Coordination: Romberg sign negative. Coordination normal.     Gait: Gait normal.     Deep Tendon Reflexes: Reflexes normal.  Psychiatric:        Mood and Affect: Mood normal.        Behavior: Behavior normal.        Thought Content: Thought content normal.        Judgment: Judgment normal.    BP 140/76   Pulse 87   Temp 98.1 F (36.7 C)  (Temporal)   Resp 18   Ht 5\' 11"  (1.803 m)   Wt (!) 361 lb 9.6 oz (164 kg)  SpO2 97%   BMI 50.43 kg/m  Wt Readings from Last 3 Encounters:  08/09/21 (!) 361 lb 9.6 oz (164 kg)  06/10/21 (!) 364 lb (165.1 kg)  12/11/19 (!) 337 lb (152.9 kg)     Health Maintenance Due  Topic Date Due   COVID-19 Vaccine (1) Never done   Pneumococcal Vaccine 84-62 Years old (1 - PCV) Never done    There are no preventive care reminders to display for this patient.  Lab Results  Component Value Date   TSH 1.56 06/10/2021   Lab Results  Component Value Date   WBC 5.2 06/10/2021   HGB 12.9 (L) 06/10/2021   HCT 40.8 06/10/2021   MCV 75.8 (L) 06/10/2021   PLT 136.0 (L) 06/10/2021   Lab Results  Component Value Date   NA 138 06/10/2021   K 3.8 06/10/2021   CO2 29 06/10/2021   GLUCOSE 95 06/10/2021   BUN 10 06/10/2021   CREATININE 0.75 06/10/2021   BILITOT 1.0 06/10/2021   ALKPHOS 85 06/10/2021   AST 22 06/10/2021   ALT 20 06/10/2021   PROT 7.1 06/10/2021   ALBUMIN 4.2 06/10/2021   CALCIUM 9.0 06/10/2021   ANIONGAP 13 10/24/2018   GFR 103.69 06/10/2021   Lab Results  Component Value Date   CHOL 137 06/10/2021   Lab Results  Component Value Date   HDL 31.00 (L) 06/10/2021   Lab Results  Component Value Date   LDLCALC 73 06/10/2021   Lab Results  Component Value Date   TRIG 162.0 (H) 06/10/2021   Lab Results  Component Value Date   CHOLHDL 4 06/10/2021   Lab Results  Component Value Date   HGBA1C 6.1 06/10/2021      Assessment & Plan:   Problem List Items Addressed This Visit   None Visit Diagnoses     Chronic migraine without aura without status migrainosus, not intractable    -  Primary   Relevant Medications   propranolol ER (INDERAL LA) 60 MG 24 hr capsule   butalbital-acetaminophen-caffeine (FIORICET) 50-325-40 MG tablet       Meds ordered this encounter  Medications   DISCONTD: SUMAtriptan (IMITREX) 50 MG tablet    Sig: Take 1 tablet (50 mg  total) by mouth every 2 (two) hours as needed for migraine. May repeat in 2 hours if headache persists or recurs.    Dispense:  10 tablet    Refill:  0    Order Specific Question:   Supervising Provider    Answer:   Carlota Raspberry, JEFFREY R [[id number]]   propranolol ER (INDERAL LA) 60 MG 24 hr capsule    Sig: Take 1 capsule (60 mg total) by mouth daily.    Dispense:  90 capsule    Refill:  0    Order Specific Question:   Supervising Provider    Answer:   Carlota Raspberry, JEFFREY R [[id number]]   butalbital-acetaminophen-caffeine (FIORICET) 50-325-40 MG tablet    Sig: Take 1 tablet by mouth every 6 (six) hours as needed for headache.    Dispense:  14 tablet    Refill:  0    Order Specific Question:   Supervising Provider    Answer:   Carlota Raspberry, JEFFREY R [[id number]]    Follow-up: Return if symptoms worsen or fail to improve.   PLAN Baseline facial asymmetry otherwise unremarkable exam He will follow up as scheduled with Dr. Krista Blue on Jan 6 We will send fioricet for prn relief. Discussed r/b/se of this medication. Can use propranolol  60mg  xr as daily preventative. Can try rizatriptan or daily propranolol as next steps if failing sumatriptan Patient encouraged to call clinic with any questions, comments, or concerns.  Maximiano Coss, NP

## 2021-08-09 NOTE — Addendum Note (Signed)
Addended by: Maximiano Coss on: 08/09/2021 10:09 AM   Modules accepted: Orders

## 2021-08-09 NOTE — Patient Instructions (Addendum)
Mr. Mells -   Doristine Devoid to see you, sorry about the circumstances   Try sumatriptan. Take one at onset of headache. Repeat in 2 hours if persisting. Max 2 doses daily. Recommend pairing with tylenol. Stay well hydrated.   If ineffective in next week, can try rizatriptan or daily propranolol.  Thank you  Rich     If you have lab work done today you will be contacted with your lab results within the next 2 weeks.  If you have not heard from [place] then please contact us. The fastest way to get your results is to register for My Chart.   IF you received an x-ray today, you will receive an invoice from [facility]. Please contact [facility] at [phone] with questions or concerns regarding your invoice.   IF you received labwork today, you will receive an invoice from [facility]. Please contact [facility] at [phone] with questions or concerns regarding your invoice.   Our billing staff will not be able to assist you with questions regarding bills from these companies.  You will be contacted with the lab results as soon as they are available. The fastest way to get your results is to activate your My Chart account. Instructions are located on the last page of this paperwork. If you have not heard from [place] regarding the results in 2 weeks, please contact this office.

## 2021-08-11 ENCOUNTER — Telehealth: Payer: Self-pay

## 2021-08-11 ENCOUNTER — Other Ambulatory Visit: Payer: Self-pay | Admitting: Registered Nurse

## 2021-08-11 NOTE — Telephone Encounter (Signed)
Lvm for patient asking him to call back and let us know if he has ever tried the other medications before

## 2021-08-11 NOTE — Telephone Encounter (Signed)
Has he tried fioricet or sumatriptan as of yet?  Thanks  [employer]

## 2021-08-11 NOTE — Telephone Encounter (Signed)
Patient returned call, he has not tried either of those medications. Please advise

## 2021-08-11 NOTE — Telephone Encounter (Signed)
Caller name:Jermale Manes   On DPR? :Yes  Call back number:[phone]  Provider they see: Richard  Reason for call:Pt is calling to give Richard message medication given for headache does some good but does not help completley its not as severe but still there propranolol ER (INDERAL LA) 60 MG 24 hr capsule

## 2021-08-12 ENCOUNTER — Encounter: Payer: Self-pay | Admitting: Registered Nurse

## 2021-09-09 ENCOUNTER — Ambulatory Visit (INDEPENDENT_AMBULATORY_CARE_PROVIDER_SITE_OTHER): Payer: BC Managed Care – PPO | Admitting: Registered Nurse

## 2021-09-09 ENCOUNTER — Encounter: Payer: Self-pay | Admitting: Registered Nurse

## 2021-09-09 ENCOUNTER — Other Ambulatory Visit: Payer: Self-pay

## 2021-09-09 ENCOUNTER — Ambulatory Visit: Payer: Self-pay | Admitting: Neurology

## 2021-09-09 ENCOUNTER — Ambulatory Visit: Payer: BC Managed Care – PPO | Admitting: Registered Nurse

## 2021-09-09 VITALS — BP 142/65 | HR 101 | Temp 98.0°F | Resp 18 | Ht 71.0 in | Wt 367.0 lb

## 2021-09-09 DIAGNOSIS — I10 Essential (primary) hypertension: Secondary | ICD-10-CM

## 2021-09-09 DIAGNOSIS — G43709 Chronic migraine without aura, not intractable, without status migrainosus: Secondary | ICD-10-CM | POA: Diagnosis not present

## 2021-09-09 DIAGNOSIS — E119 Type 2 diabetes mellitus without complications: Secondary | ICD-10-CM

## 2021-09-09 DIAGNOSIS — F40240 Claustrophobia: Secondary | ICD-10-CM

## 2021-09-09 LAB — POCT GLYCOSYLATED HEMOGLOBIN (HGB A1C): Hemoglobin A1C: 5.9 % — AB (ref 4.0–5.6)

## 2021-09-09 MED ORDER — SEMAGLUTIDE(0.25 OR 0.5MG/DOS) 2 MG/1.5ML ~~LOC~~ SOPN
0.5000 mg | PEN_INJECTOR | SUBCUTANEOUS | 0 refills | Status: AC
Start: 2021-09-09 — End: 2021-12-02

## 2021-09-09 MED ORDER — CLONAZEPAM 0.5 MG PO TABS: 0.5000 mg | ORAL_TABLET | Freq: Two times a day (BID) | ORAL | 0 refills | Status: DC | PRN

## 2021-09-09 MED ORDER — PROPRANOLOL HCL ER 60 MG PO CP24
60.0000 mg | ORAL_CAPSULE | Freq: Every day | ORAL | 0 refills | Status: DC
Start: 2021-09-09 — End: 2021-12-12

## 2021-09-09 MED ORDER — AMLODIPINE BESYLATE 10 MG PO TABS: 10.0000 mg | ORAL_TABLET | Freq: Every day | ORAL | 1 refills | Status: DC

## 2021-09-09 MED ORDER — BUTALBITAL-APAP-CAFFEINE 50-325-40 MG PO TABS: 1.0000 | ORAL_TABLET | Freq: Four times a day (QID) | ORAL | 0 refills | Status: DC | PRN

## 2021-09-09 NOTE — Patient Instructions (Addendum)
Dwayne Lewis -   Dwayne Lewis to see you   Can call Dwayne Lewis office at [phone] to schedule. He has okayed the referral from his end.  Start semaglutide injection weekly. Call if any undesirable side effects.  Check in around the 4-6 mo mark  Thank you! Happy New Year. Safe travels in April if I don't see you before then  Dwayne Lewis     If you have lab work done today you will be contacted with your lab results within the next 2 weeks.  If you have not heard from [place] then please contact us. The fastest way to get your results is to register for My Chart.   IF you received an x-ray today, you will receive an invoice from [facility]. Please contact [facility] at [phone] with questions or concerns regarding your invoice.   IF you received labwork today, you will receive an invoice from [facility]. Please contact [facility] at [phone] with questions or concerns regarding your invoice.   Our billing staff will not be able to assist you with questions regarding bills from these companies.  You will be contacted with the lab results as soon as they are available. The fastest way to get your results is to activate your My Chart account. Instructions are located on the last page of this paperwork. If you have not heard from [place] regarding the results in 2 weeks, please contact this office.

## 2021-09-09 NOTE — Progress Notes (Signed)
Established Patient Office Visit  Subjective:  Patient ID: Dwayne Lewis, male    DOB: March 12, 1971  Age: 50 y.o. MRN: [id number]  CC:  Chief Complaint  Patient presents with   Follow-up    Patient states he is here for 3 month follow up.    HPI Dwayne Lewis presents for htn, t2dm  Hypertension: Patient Currently taking: amlodipine-benazepril 5-10mg  po qd Good effect. No AEs. Denies CV symptoms including: chest pain, shob, doe, headache, visual changes, fatigue, claudication, and dependent edema.   Previous readings and labs: BP Readings from Last 3 Encounters:  09/09/21 (!) 142/65  08/09/21 140/76  06/10/21 (!) 155/74   Lab Results  Component Value Date   CREATININE 0.75 06/10/2021    T2dm Last A1c:  Lab Results  Component Value Date   HGBA1C 5.9 (A) 09/09/2021    Currently taking: none No new complications Reports good compliance with medications Diet has been steady Exercise habits have been stable  Weight Concern for trouble losing weight Low T, hx of t2dm Wants to lose weight soon  Past Medical History:  Diagnosis Date   Adenoma of left adrenal gland    incidental on CT abd 10/2018.   Bell's palsy    L side.  Pain involved (?)--got better with gabapentin.   Diabetes mellitus without complication (HCC)    no meds, hx of   Erosive gastritis 10/2018   EGD   Esophageal stenosis 10/2018   dilation done   High risk sexual behavior    sex with men: started PrEP (descovy) 08/15/19.   Hyperlipidemia    75), HDL 27, TG 105. LDL goal= <110, no per pt   Hypertension    Morbid obesity ([place])    NASH (nonalcoholic steatohepatitis)    Rapid weight loss 2020   Purposeful   Seasonal allergies    RAD with RTIs only    Past Surgical History:  Procedure Laterality Date   COLONOSCOPY W/ POLYPECTOMY  10/27/2019   Polyp x 1->submucosal lipoma.  Recall 10 yrs.   ESOPHAGOGASTRODUODENOSCOPY  10/29/2018   LASIK  2000   Bilaterally   TONSILLECTOMY      UPPER GASTROINTESTINAL ENDOSCOPY  2020    Family History  Problem Relation Age of Onset   Hypertension Mother    Lung cancer Maternal Grandmother    COPD Maternal Grandmother        uncle    Heart failure Maternal Grandfather    Transient ischemic attack Maternal Grandfather    Hypertension Other    Diabetes Other        MG aunt   Colon cancer Neg Hx    Prostate cancer Neg Hx    CAD Neg Hx    Esophageal cancer Neg Hx    Stomach cancer Neg Hx    Rectal cancer Neg Hx     Social History   Socioeconomic History   Marital status: Single    Spouse name: Not on file   Number of children: 0   Years of education: Not on file   Highest education level: Some college, no degree  Occupational History   Occupation: works for a Art gallery manager time   Tobacco Use   Smoking status: Never   Smokeless tobacco: Never  Vaping Use   Vaping Use: Never used  Substance and Sexual Activity   Alcohol use: Not Currently    Alcohol/week: 0.0 standard drinks   Drug use: No   Sexual activity: Not Currently  Other Topics Concern  Not on file  Social History Narrative   Single, no children.   Orig from [place].   Occup: works part time for [employer].   No tobacco.   No alcohol.      Lives w/ mother to help her out (mother is [name])   Social Determinants of Health   Financial Resource Strain: Not on file  Food Insecurity: Not on file  Transportation Needs: Not on file  Physical Activity: Not on file  Stress: Not on file  Social Connections: Not on file  Intimate Partner Violence: Not on file    Outpatient Medications Prior to Visit  Medication Sig Dispense Refill   emtricitabine-tenofovir AF (DESCOVY) 200-25 MG tablet Take 1 tablet by mouth daily. 30 tablet 11   pantoprazole (PROTONIX) 40 MG tablet TAKE 1 TABLET BY MOUTH TWICE A DAY X30 DAYS THEN ONCE DAILY 90 tablet 3   amLODipine-benazepril (LOTREL) 5-10 MG capsule Take 1 capsule by mouth daily. 90 capsule  3   butalbital-acetaminophen-caffeine (FIORICET) 50-325-40 MG tablet Take 1 tablet by mouth every 6 (six) hours as needed for headache. 14 tablet 0   clonazePAM (KLONOPIN) 0.5 MG tablet Take 1-2 tablets (0.5-1 mg total) by mouth 2 (two) times daily as needed for anxiety. 10 tablet 0   propranolol ER (INDERAL LA) 60 MG 24 hr capsule Take 1 capsule (60 mg total) by mouth daily. 90 capsule 0   No facility-administered medications prior to visit.    Allergies  Allergen Reactions   Penicillins     Rash    Sulfonamide Derivatives     REACTION: rash Tongue swelling   Sulfa Antibiotics Swelling    Tongue swelling    ROS Review of Systems  Constitutional: Negative.   HENT: Negative.    Eyes: Negative.   Respiratory: Negative.    Cardiovascular: Negative.   Gastrointestinal: Negative.   Genitourinary: Negative.   Musculoskeletal: Negative.   Skin: Negative.   Neurological: Negative.   Psychiatric/Behavioral: Negative.    All other systems reviewed and are negative.    Objective:    Physical Exam Constitutional:      General: He is not in acute distress.    Appearance: Normal appearance. He is obese. He is not ill-appearing, toxic-appearing or diaphoretic.  Cardiovascular:     Rate and Rhythm: Normal rate and regular rhythm.     Heart sounds: Normal heart sounds. No murmur heard.   No friction rub. No gallop.  Pulmonary:     Effort: Pulmonary effort is normal. No respiratory distress.     Breath sounds: Normal breath sounds. No stridor. No wheezing, rhonchi or rales.  Chest:     Chest wall: No tenderness.  Neurological:     General: No focal deficit present.     Mental Status: He is alert and oriented to person, place, and time. Mental status is at baseline.  Psychiatric:        Mood and Affect: Mood normal.        Behavior: Behavior normal.        Thought Content: Thought content normal.        Judgment: Judgment normal.    BP (!) 142/65    Pulse (!) 101    Temp 98  F (36.7 C) (Temporal)    Resp 18    Ht 5\' 11"  (1.803 m)    Wt (!) 367 lb (166.5 kg)    SpO2 99%    BMI 51.19 kg/m  Wt Readings from  Last 3 Encounters:  09/09/21 (!) 367 lb (166.5 kg)  08/09/21 (!) 361 lb 9.6 oz (164 kg)  06/10/21 (!) 364 lb (165.1 kg)     Health Maintenance Due  Topic Date Due   COVID-19 Vaccine (1) Never done   Pneumococcal Vaccine 78-24 Years old (1 - PCV) Never done   URINE MICROALBUMIN  09/28/2020    There are no preventive care reminders to display for this patient.  Lab Results  Component Value Date   TSH 1.56 06/10/2021   Lab Results  Component Value Date   WBC 5.2 06/10/2021   HGB 12.9 (L) 06/10/2021   HCT 40.8 06/10/2021   MCV 75.8 (L) 06/10/2021   PLT 136.0 (L) 06/10/2021   Lab Results  Component Value Date   NA 138 06/10/2021   K 3.8 06/10/2021   CO2 29 06/10/2021   GLUCOSE 95 06/10/2021   BUN 10 06/10/2021   CREATININE 0.75 06/10/2021   BILITOT 1.0 06/10/2021   ALKPHOS 85 06/10/2021   AST 22 06/10/2021   ALT 20 06/10/2021   PROT 7.1 06/10/2021   ALBUMIN 4.2 06/10/2021   CALCIUM 9.0 06/10/2021   ANIONGAP 13 10/24/2018   GFR 103.69 06/10/2021   Lab Results  Component Value Date   CHOL 137 06/10/2021   Lab Results  Component Value Date   HDL 31.00 (L) 06/10/2021   Lab Results  Component Value Date   LDLCALC 73 06/10/2021   Lab Results  Component Value Date   TRIG 162.0 (H) 06/10/2021   Lab Results  Component Value Date   CHOLHDL 4 06/10/2021   Lab Results  Component Value Date   HGBA1C 5.9 (A) 09/09/2021      Assessment & Plan:   Problem List Items Addressed This Visit   None Visit Diagnoses     Diabetes mellitus without complication ([place])    -  Primary   Relevant Medications   Semaglutide,0.25 or 0.5MG /DOS, 2 MG/1.5ML SOPN   Other Relevant Orders   POCT HgB A1C (Completed)   Chronic migraine without aura without status migrainosus, not intractable       Relevant Medications    butalbital-acetaminophen-caffeine (FIORICET) 50-325-40 MG tablet   propranolol ER (INDERAL LA) 60 MG 24 hr capsule   amLODipine (NORVASC) 10 MG tablet   clonazePAM (KLONOPIN) 0.5 MG tablet   Essential hypertension       Relevant Medications   propranolol ER (INDERAL LA) 60 MG 24 hr capsule   amLODipine (NORVASC) 10 MG tablet   Claustrophobia       Relevant Medications   clonazePAM (KLONOPIN) 0.5 MG tablet       Meds ordered this encounter  Medications   butalbital-acetaminophen-caffeine (FIORICET) 50-325-40 MG tablet    Sig: Take 1 tablet by mouth every 6 (six) hours as needed for headache.    Dispense:  14 tablet    Refill:  0    Order Specific Question:   Supervising Provider    Answer:   Carlota Raspberry, JEFFREY R [[id number]]   propranolol ER (INDERAL LA) 60 MG 24 hr capsule    Sig: Take 1 capsule (60 mg total) by mouth daily.    Dispense:  90 capsule    Refill:  0    Order Specific Question:   Supervising Provider    Answer:   Carlota Raspberry, JEFFREY R [[id number]]   amLODipine (NORVASC) 10 MG tablet    Sig: Take 1 tablet (10 mg total) by mouth daily.    Dispense:  90 tablet  Refill:  1    Order Specific Question:   Supervising Provider    Answer:   Carlota Raspberry, JEFFREY R [[id number]]   clonazePAM (KLONOPIN) 0.5 MG tablet    Sig: Take 1-2 tablets (0.5-1 mg total) by mouth 2 (two) times daily as needed for anxiety.    Dispense:  10 tablet    Refill:  0    Order Specific Question:   Supervising Provider    Answer:   Carlota Raspberry, JEFFREY R [[id number]]   Semaglutide,0.25 or 0.5MG /DOS, 2 MG/1.5ML SOPN    Sig: Inject 0.5 mg into the skin once a week.    Dispense:  4.5 mL    Refill:  0    Order Specific Question:   Supervising Provider    Answer:   Carlota Raspberry, JEFFREY R [[id number]]    Follow-up: Return in about 6 months (around 03/10/2022) for 4-6 mo for t2dm, htn.   PLAN A1c improved to 5.9. continue healthy lifestyle. BP borderline but pt admits to some hypotensive symptoms at times. Will change therapy to 10mg   amlodipine daily.  Resent fioricet and clonazepam for prn use. Start semaglutide 0.5mg  subq weekly for weight management. Med check in 3-6 mo Given Dr. Cordelia Pen info for testosterone management.  Patient encouraged to call clinic with any questions, comments, or concerns.  Maximiano Coss, NP

## 2021-09-16 ENCOUNTER — Ambulatory Visit: Payer: Self-pay | Admitting: Neurology

## 2021-09-16 ENCOUNTER — Encounter: Payer: Self-pay | Admitting: Registered Nurse

## 2021-09-27 ENCOUNTER — Ambulatory Visit (INDEPENDENT_AMBULATORY_CARE_PROVIDER_SITE_OTHER): Payer: BC Managed Care – PPO | Admitting: Registered Nurse

## 2021-09-27 ENCOUNTER — Other Ambulatory Visit: Payer: Self-pay | Admitting: Registered Nurse

## 2021-09-27 ENCOUNTER — Encounter: Payer: Self-pay | Admitting: Registered Nurse

## 2021-09-27 VITALS — BP 142/88 | HR 81 | Temp 98.3°F | Resp 17 | Wt 368.4 lb

## 2021-09-27 DIAGNOSIS — Z8719 Personal history of other diseases of the digestive system: Secondary | ICD-10-CM | POA: Diagnosis not present

## 2021-09-27 DIAGNOSIS — K648 Other hemorrhoids: Secondary | ICD-10-CM | POA: Diagnosis not present

## 2021-09-27 LAB — CBC WITH DIFFERENTIAL/PLATELET
Basophils Absolute: 0 10*3/uL (ref 0.0–0.1)
Basophils Relative: 0.6 % (ref 0.0–3.0)
Eosinophils Absolute: 0.1 10*3/uL (ref 0.0–0.7)
Eosinophils Relative: 1.5 % (ref 0.0–5.0)
HCT: 41.8 % (ref 39.0–52.0)
Hemoglobin: 13.5 g/dL (ref 13.0–17.0)
Lymphocytes Relative: 23.3 % (ref 12.0–46.0)
Lymphs Abs: 1.4 10*3/uL (ref 0.7–4.0)
MCHC: 32.3 g/dL (ref 30.0–36.0)
MCV: 80.3 fl (ref 78.0–100.0)
Monocytes Absolute: 0.6 10*3/uL (ref 0.1–1.0)
Monocytes Relative: 9.8 % (ref 3.0–12.0)
Neutro Abs: 3.9 10*3/uL (ref 1.4–7.7)
Neutrophils Relative %: 64.8 % (ref 43.0–77.0)
Platelets: 166 10*3/uL (ref 150.0–400.0)
RBC: 5.21 Mil/uL (ref 4.22–5.81)
RDW: 15.5 % (ref 11.5–15.5)
WBC: 5.9 10*3/uL (ref 4.0–10.5)

## 2021-09-27 MED ORDER — SUCRALFATE 1 G PO TABS: 1.0000 g | ORAL_TABLET | Freq: Three times a day (TID) | ORAL | 0 refills | Status: DC

## 2021-09-27 MED ORDER — HYDROCORTISONE 2.5 % EX KIT: 1.0000 "application " | PACK | Freq: Two times a day (BID) | CUTANEOUS | 4 refills | Status: DC

## 2021-09-27 NOTE — Progress Notes (Signed)
Established Patient Office Visit  Subjective:  Patient ID: Dwayne Lewis, male    DOB: 1971-06-01  Age: 51 y.o. MRN: [id number]  CC:  Chief Complaint  Patient presents with   Rectal Bleeding    HPI Dwayne ANDER presents for rectal bleeding   Onset in October - intermittent  One severe episode in December A lot of blood yesterday.  No pain in rectum, pain with bm. No melena, nvd, weight changes.   Notes some stomach aching, soreness. Similar to early 2020 with acute illness at that time.  He had an upper respiratory infection and Bells Palsy in late 2019 followed by acute pallor, collapse. He was hospitalized, found to have a large number of ulcers that were contributing to blood loss.   Colonoscopy 2021 noted one 28mm polyp and internal hemorrhoids.   Past Medical History:  Diagnosis Date   Adenoma of left adrenal gland    incidental on CT abd 10/2018.   Bell's palsy    L side.  Pain involved (?)--got better with gabapentin.   Diabetes mellitus without complication (HCC)    no meds, hx of   Erosive gastritis 10/2018   EGD   Esophageal stenosis 10/2018   dilation done   High risk sexual behavior    sex with men: started PrEP (descovy) 08/15/19.   Hyperlipidemia    75), HDL 27, TG 105. LDL goal= <110, no per pt   Hypertension    Morbid obesity ([place])    NASH (nonalcoholic steatohepatitis)    Rapid weight loss 2020   Purposeful   Seasonal allergies    RAD with RTIs only    Past Surgical History:  Procedure Laterality Date   COLONOSCOPY W/ POLYPECTOMY  10/27/2019   Polyp x 1->submucosal lipoma.  Recall 10 yrs.   ESOPHAGOGASTRODUODENOSCOPY  10/29/2018   LASIK  2000   Bilaterally   TONSILLECTOMY     UPPER GASTROINTESTINAL ENDOSCOPY  2020    Family History  Problem Relation Age of Onset   Hypertension Mother    Lung cancer Maternal Grandmother    COPD Maternal Grandmother        uncle    Heart failure Maternal Grandfather    Transient ischemic  attack Maternal Grandfather    Hypertension Other    Diabetes Other        MG aunt   Colon cancer Neg Hx    Prostate cancer Neg Hx    CAD Neg Hx    Esophageal cancer Neg Hx    Stomach cancer Neg Hx    Rectal cancer Neg Hx     Social History   Socioeconomic History   Marital status: Single    Spouse name: Not on file   Number of children: 0   Years of education: Not on file   Highest education level: Some college, no degree  Occupational History   Occupation: works for a Art gallery manager time   Tobacco Use   Smoking status: Never   Smokeless tobacco: Never  Vaping Use   Vaping Use: Never used  Substance and Sexual Activity   Alcohol use: Not Currently    Alcohol/week: 0.0 standard drinks   Drug use: No   Sexual activity: Not Currently  Other Topics Concern   Not on file  Social History Narrative   Single, no children.   Orig from [place].   Occup: works part time for [employer].   No tobacco.   No alcohol.  Lives w/ mother to help her out (mother is [name])   Social Determinants of Health   Financial Resource Strain: Not on file  Food Insecurity: Not on file  Transportation Needs: Not on file  Physical Activity: Not on file  Stress: Not on file  Social Connections: Not on file  Intimate Partner Violence: Not on file    Outpatient Medications Prior to Visit  Medication Sig Dispense Refill   amLODipine (NORVASC) 10 MG tablet Take 1 tablet (10 mg total) by mouth daily. 90 tablet 1   butalbital-acetaminophen-caffeine (FIORICET) 50-325-40 MG tablet Take 1 tablet by mouth every 6 (six) hours as needed for headache. 14 tablet 0   clonazePAM (KLONOPIN) 0.5 MG tablet Take 1-2 tablets (0.5-1 mg total) by mouth 2 (two) times daily as needed for anxiety. 10 tablet 0   emtricitabine-tenofovir AF (DESCOVY) 200-25 MG tablet Take 1 tablet by mouth daily. 30 tablet 11   pantoprazole (PROTONIX) 40 MG tablet TAKE 1 TABLET BY MOUTH TWICE A DAY X30 DAYS  THEN ONCE DAILY 90 tablet 3   propranolol ER (INDERAL LA) 60 MG 24 hr capsule Take 1 capsule (60 mg total) by mouth daily. (Patient not taking: Reported on 09/27/2021) 90 capsule 0   Semaglutide,0.25 or 0.5MG /DOS, 2 MG/1.5ML SOPN Inject 0.5 mg into the skin once a week. (Patient not taking: Reported on 09/27/2021) 4.5 mL 0   No facility-administered medications prior to visit.    Allergies  Allergen Reactions   Penicillins     Rash    Sulfonamide Derivatives     REACTION: rash Tongue swelling   Sulfa Antibiotics Swelling    Tongue swelling    ROS Review of Systems Per hpi     Objective:    Physical Exam Constitutional:      General: He is not in acute distress.    Appearance: Normal appearance. He is normal weight. He is not ill-appearing, toxic-appearing or diaphoretic.  Cardiovascular:     Rate and Rhythm: Normal rate and regular rhythm.     Heart sounds: Normal heart sounds. No murmur heard.   No friction rub. No gallop.  Pulmonary:     Effort: Pulmonary effort is normal. No respiratory distress.     Breath sounds: Normal breath sounds. No stridor. No wheezing, rhonchi or rales.  Chest:     Chest wall: No tenderness.  Genitourinary:    Comments: Deferred by pt Neurological:     General: No focal deficit present.     Mental Status: He is alert and oriented to person, place, and time. Mental status is at baseline.  Psychiatric:        Mood and Affect: Mood normal.        Behavior: Behavior normal.        Thought Content: Thought content normal.        Judgment: Judgment normal.    BP (!) 142/88    Pulse 81    Temp 98.3 F (36.8 C)    Resp 17    Wt (!) 368 lb 6.4 oz (167.1 kg)    SpO2 97%    BMI 51.38 kg/m  Wt Readings from Last 3 Encounters:  09/27/21 (!) 368 lb 6.4 oz (167.1 kg)  09/09/21 (!) 367 lb (166.5 kg)  08/09/21 (!) 361 lb 9.6 oz (164 kg)     Health Maintenance Due  Topic Date Due   Pneumococcal Vaccine 40-68 Years old (1 - PCV) Never done    URINE MICROALBUMIN  09/28/2020  There are no preventive care reminders to display for this patient.  Lab Results  Component Value Date   TSH 1.56 06/10/2021   Lab Results  Component Value Date   WBC 5.2 06/10/2021   HGB 12.9 (L) 06/10/2021   HCT 40.8 06/10/2021   MCV 75.8 (L) 06/10/2021   PLT 136.0 (L) 06/10/2021   Lab Results  Component Value Date   NA 138 06/10/2021   K 3.8 06/10/2021   CO2 29 06/10/2021   GLUCOSE 95 06/10/2021   BUN 10 06/10/2021   CREATININE 0.75 06/10/2021   BILITOT 1.0 06/10/2021   ALKPHOS 85 06/10/2021   AST 22 06/10/2021   ALT 20 06/10/2021   PROT 7.1 06/10/2021   ALBUMIN 4.2 06/10/2021   CALCIUM 9.0 06/10/2021   ANIONGAP 13 10/24/2018   GFR 103.69 06/10/2021   Lab Results  Component Value Date   CHOL 137 06/10/2021   Lab Results  Component Value Date   HDL 31.00 (L) 06/10/2021   Lab Results  Component Value Date   LDLCALC 73 06/10/2021   Lab Results  Component Value Date   TRIG 162.0 (H) 06/10/2021   Lab Results  Component Value Date   CHOLHDL 4 06/10/2021   Lab Results  Component Value Date   HGBA1C 5.9 (A) 09/09/2021      Assessment & Plan:   Problem List Items Addressed This Visit   None Visit Diagnoses     Internal hemorrhoids    -  Primary   Relevant Medications   Hydrocortisone 2.5 % KIT   Other Relevant Orders   CBC with Differential/Platelet   History of gastrointestinal ulcer       Relevant Medications   sucralfate (CARAFATE) 1 g tablet   Other Relevant Orders   CBC with Differential/Platelet   H Pylori, IGM, IGG, IGA AB       Meds ordered this encounter  Medications   sucralfate (CARAFATE) 1 g tablet    Sig: Take 1 tablet (1 g total) by mouth 4 (four) times daily -  with meals and at bedtime.    Dispense:  40 tablet    Refill:  0    Order Specific Question:   Supervising Provider    Answer:   Carlota Raspberry, JEFFREY R [[id number]]   Hydrocortisone 2.5 % KIT    Sig: Apply 1 application topically in the  morning and at bedtime.    Dispense:  1 kit    Refill:  4    Order Specific Question:   Supervising Provider    Answer:   Carlota Raspberry, JEFFREY R [[id number]]    Follow-up: Return if symptoms worsen or fail to improve.   PLAN Suspect internal hemorrhoids flaring. Will give hydrocortisone cream as above. Concern for recurrence of ulcers. Will give sucralfate. Continue protonix. Labs for h pylori and cbc. Return prn Patient encouraged to call clinic with any questions, comments, or concerns.  Maximiano Coss, NP

## 2021-09-27 NOTE — Patient Instructions (Signed)
Mr. Anselmi -   Doristine Devoid to see you!  Apply hydrocortisone rectally twice daily for at least one week.  Take sucralfate for ten day course. Continue protonix.  I will check for H. Pylori and check on blood counts. If concerns arise, we can get you back in with Dr. Fuller Plan to figure out next steps. I'll let you know  Keep me in the loop if symptoms worsen or fail to improve in the next 2-3 days.  Thank you  Rich

## 2021-10-08 LAB — H PYLORI, IGM, IGG, IGA AB
H pylori, IgM Abs: <9 units (ref 0.0–8.9)
H. pylori, IgA Abs: 11.2 units — ABNORMAL HIGH (ref 0.0–8.9)
H. pylori, IgG AbS: 0.32 Index Value (ref 0.00–0.79)

## 2021-10-11 ENCOUNTER — Encounter: Payer: Self-pay | Admitting: Registered Nurse

## 2021-10-11 NOTE — Telephone Encounter (Signed)
Pt reports he is still bleeding wants to know next steps

## 2021-10-13 ENCOUNTER — Ambulatory Visit (INDEPENDENT_AMBULATORY_CARE_PROVIDER_SITE_OTHER): Payer: BC Managed Care – PPO | Admitting: Endocrinology

## 2021-10-13 ENCOUNTER — Other Ambulatory Visit: Payer: Self-pay

## 2021-10-13 VITALS — BP 176/100 | HR 94 | Ht 71.0 in | Wt 373.8 lb

## 2021-10-13 DIAGNOSIS — R7989 Other specified abnormal findings of blood chemistry: Secondary | ICD-10-CM | POA: Diagnosis not present

## 2021-10-13 DIAGNOSIS — Z125 Encounter for screening for malignant neoplasm of prostate: Secondary | ICD-10-CM | POA: Diagnosis not present

## 2021-10-13 LAB — PSA: PSA: 1.23 ng/mL (ref 0.10–4.00)

## 2021-10-13 LAB — PROLACTIN: Prolactin: 5.5 ng/mL (ref 2.0–18.0)

## 2021-10-13 LAB — LUTEINIZING HORMONE: LH: 3.51 m[IU]/mL (ref 1.50–9.30)

## 2021-10-13 LAB — FOLLICLE STIMULATING HORMONE: FSH: 2.5 m[IU]/mL (ref 1.4–18.1)

## 2021-10-13 NOTE — Patient Instructions (Addendum)
Blood tests are requested for you today.  We'll let you know about the results.    Testosterone treatment has risks, including increased or decreased fertility (depending on the type of treatment), hair loss, prostate cancer, benign prostate enlargement, blood clots, liver problems, lower hdl ("good cholesterol"), polycythemia (opposite of anemia), sleep apnea, and behavior changes.   Weight loss also helps the testosterone.

## 2021-10-13 NOTE — Progress Notes (Signed)
Subjective:    Patient ID: Dwayne Lewis, male    DOB: 01-Dec-1970, 51 y.o.   MRN: [id number]  HPI Pt is referred by Maximiano Coss, NP, for low testosterone level.  Pt reports he had puberty at the normal age.  He has no biological children, by choice.  He has never had pituitary imaging. He does not take antiandrogens or opioids.  He denies any h/o infertility, XRT, or genital infection.  He has never had surgery, or a serious injury to the head or genital area. He has no h/o sleep apnea or DVT.   He does not consume alcohol excessively.  He was dx'ed with low testosterone in 2021, and took injections 12/21-4/22.  He reports fatigue, ED, and weight gain.  Rx did not help sxs.  He is unaware of any w/u.   Past Medical History:  Diagnosis Date   Adenoma of left adrenal gland    incidental on CT abd 10/2018.   Bell's palsy    L side.  Pain involved (?)--got better with gabapentin.   Diabetes mellitus without complication (HCC)    no meds, hx of   Erosive gastritis 10/2018   EGD   Esophageal stenosis 10/2018   dilation done   High risk sexual behavior    sex with men: started PrEP (descovy) 08/15/19.   Hyperlipidemia    75), HDL 27, TG 105. LDL goal= <110, no per pt   Hypertension    Morbid obesity ([place])    NASH (nonalcoholic steatohepatitis)    Rapid weight loss 2020   Purposeful   Seasonal allergies    RAD with RTIs only    Past Surgical History:  Procedure Laterality Date   COLONOSCOPY W/ POLYPECTOMY  10/27/2019   Polyp x 1->submucosal lipoma.  Recall 10 yrs.   ESOPHAGOGASTRODUODENOSCOPY  10/29/2018   LASIK  2000   Bilaterally   TONSILLECTOMY     UPPER GASTROINTESTINAL ENDOSCOPY  2020    Social History   Socioeconomic History   Marital status: Single    Spouse name: Not on file   Number of children: 0   Years of education: Not on file   Highest education level: Some college, no degree  Occupational History   Occupation: works for a Art gallery manager time   Tobacco Use    Smoking status: Never   Smokeless tobacco: Never  Vaping Use   Vaping Use: Never used  Substance and Sexual Activity   Alcohol use: Not Currently    Alcohol/week: 0.0 standard drinks   Drug use: No   Sexual activity: Not Currently  Other Topics Concern   Not on file  Social History Narrative   Single, no children.   Orig from [place].   Occup: works part time for [employer].   No tobacco.   No alcohol.      Lives w/ mother to help her out (mother is [name])   Social Determinants of Health   Financial Resource Strain: Not on file  Food Insecurity: Not on file  Transportation Needs: Not on file  Physical Activity: Not on file  Stress: Not on file  Social Connections: Not on file  Intimate Partner Violence: Not on file    Current Outpatient Medications on File Prior to Visit  Medication Sig Dispense Refill   amLODipine (NORVASC) 10 MG tablet Take 1 tablet (10 mg total) by mouth daily. 90 tablet 1   butalbital-acetaminophen-caffeine (FIORICET) 50-325-40 MG tablet Take 1 tablet by mouth every  6 (six) hours as needed for headache. 14 tablet 0   clonazePAM (KLONOPIN) 0.5 MG tablet Take 1-2 tablets (0.5-1 mg total) by mouth 2 (two) times daily as needed for anxiety. 10 tablet 0   emtricitabine-tenofovir AF (DESCOVY) 200-25 MG tablet Take 1 tablet by mouth daily. 30 tablet 11   hydrocortisone 2.5 % ointment APPLY 1 APPLICATION TOPICALLY IN THE MORNING AND AT BEDTIME. 20 g 4   pantoprazole (PROTONIX) 40 MG tablet TAKE 1 TABLET BY MOUTH TWICE A DAY X30 DAYS THEN ONCE DAILY 90 tablet 3   propranolol ER (INDERAL LA) 60 MG 24 hr capsule Take 1 capsule (60 mg total) by mouth daily. 90 capsule 0   Semaglutide,0.25 or 0.5MG /DOS, 2 MG/1.5ML SOPN Inject 0.5 mg into the skin once a week. 4.5 mL 0   sucralfate (CARAFATE) 1 g tablet Take 1 tablet (1 g total) by mouth 4 (four) times daily -  with meals and at bedtime. 40 tablet 0   No current facility-administered  medications on file prior to visit.    Allergies  Allergen Reactions   Penicillins     Rash    Sulfonamide Derivatives     REACTION: rash Tongue swelling   Sulfa Antibiotics Swelling    Tongue swelling    Family History  Problem Relation Age of Onset   Hypertension Mother    Lung cancer Maternal Grandmother    COPD Maternal Grandmother        uncle    Heart failure Maternal Grandfather    Transient ischemic attack Maternal Grandfather    Hypertension Other    Diabetes Other        MG aunt   Colon cancer Neg Hx    Prostate cancer Neg Hx    CAD Neg Hx    Esophageal cancer Neg Hx    Stomach cancer Neg Hx    Rectal cancer Neg Hx     BP (!) 176/100    Pulse 94    Ht 5\' 11"  (1.803 m)    Wt (!) 373 lb 12.8 oz (169.6 kg)    SpO2 97%    BMI 52.13 kg/m    Review of Systems denies muscle weakness and sob.     Objective:   Physical Exam VS: see vs page GEN: no distress HEAD: head: no deformity eyes: no periorbital swelling, no proptosis external nose and ears are normal NECK: supple, thyroid is not enlarged CHEST WALL: no deformity LUNGS: clear to auscultation BREASTS:  bilat pseudogynecomastia CV: reg rate and rhythm, no murmur GENITALIA:  Normal male.   MUSCULOSKELETAL: muscle bulk and strength are grossly normal.  no joint swelling is seen  gait is normal and steady EXTEMITIES: 1+ bilat leg edema NEURO: sensation is intact to touch on all 4's SKIN:  Normal texture and temperature.  No rash or suspicious lesion is visible.  Normal male hair distribution. NODES:  None palpable at the neck PSYCH: alert, well-oriented.  Does not appear anxious nor depressed.    Lab Results  Component Value Date   TESTOSTERONE 185.12 (L) 06/10/2021   Lab Results  Component Value Date   TSH 1.56 06/10/2021   T4TOTAL 6.7 06/10/2021   Lab Results  Component Value Date   WBC 5.9 09/27/2021   HGB 13.5 09/27/2021   HCT 41.8 09/27/2021   MCV 80.3 09/27/2021   PLT 166.0  09/27/2021   Lab Results  Component Value Date   PSA 2.23 09/29/2019    I have reviewed outside  records, and summarized: Pt was noted to have low testosterone, and referred here.  DM, HTN, and migraine, and anxiety were also addressed     Assessment & Plan:  Low testosterone, uncertain etiology and prognosis.  Patient Instructions  Blood tests are requested for you today.  We'll let you know about the results.    Testosterone treatment has risks, including increased or decreased fertility (depending on the type of treatment), hair loss, prostate cancer, benign prostate enlargement, blood clots, liver problems, lower hdl ("good cholesterol"), polycythemia (opposite of anemia), sleep apnea, and behavior changes.   Weight loss also helps the testosterone.

## 2021-10-17 ENCOUNTER — Encounter: Payer: Self-pay | Admitting: Endocrinology

## 2021-10-18 ENCOUNTER — Ambulatory Visit (INDEPENDENT_AMBULATORY_CARE_PROVIDER_SITE_OTHER): Payer: BC Managed Care – PPO | Admitting: Neurology

## 2021-10-18 ENCOUNTER — Encounter: Payer: Self-pay | Admitting: Neurology

## 2021-10-18 VITALS — BP 147/93 | HR 89 | Ht 71.0 in | Wt 369.5 lb

## 2021-10-18 DIAGNOSIS — Z21 Asymptomatic human immunodeficiency virus [HIV] infection status: Secondary | ICD-10-CM | POA: Insufficient documentation

## 2021-10-18 DIAGNOSIS — R29810 Facial weakness: Secondary | ICD-10-CM | POA: Diagnosis not present

## 2021-10-18 DIAGNOSIS — R200 Anesthesia of skin: Secondary | ICD-10-CM

## 2021-10-18 MED ORDER — GABAPENTIN 300 MG PO CAPS: 300.0000 mg | ORAL_CAPSULE | Freq: Three times a day (TID) | ORAL | 11 refills | Status: DC | PRN

## 2021-10-18 NOTE — Progress Notes (Signed)
Chief Complaint  Patient presents with   New Patient (Initial Visit)    Rm 14. Alone. PCP is Maximiano Coss, NP. 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 Pt is seeking relief for numbness on left side. Pt reports pain from lip to ear above his jaw line that is a stabbing pain.      ASSESSMENT AND PLAN  Dwayne Lewis is a 51 y.o. male   Reported history of left Bell's palsy on September 16, 2018, Intermittent left jaw shooting pain  Sudden onset of left upper and lower face muscle weakness, the atypical features is numbness involving the left upper and lower lips, and since 2022, also had recurrent shooting pain along the left jaw, suggestive of left trigeminal nerve involvement,  Proceed with MRI of the brain with without contrast with emphasized on trigeminal nerve  Gabapentin 300 mg 3 times daily    DIAGNOSTIC DATA (LABS, IMAGING, TESTING) - I reviewed patient records, labs, notes, testing and imaging myself where available. September 2022: Normal CMP creatinine 0.75, A1c 6.1, vitamin D 20.51, normal TSH, CBC showed hemoglobin of 12.9, triglyceride mildly elevated 162, [id number], testosterone level was decreased 185  MEDICAL HISTORY:  Dwayne Lewis is a 51 year old male, seen in request by his primary care nurse practitioner   Maximiano Coss, for evaluation of left facial numbness and weakness, initial evaluation was on October 18, 2021   I reviewed and summarized the referring note. PMHX HTN GERD Obesity  He woke up on September 16, 2018, noticed left facial weakness, could not close his left eye, left lower face droopy, at the same time he felt numbness involving his left cheek, he was diagnosed with left Bell's palsy, over the next few weeks, his left facial muscle weakness has much improved, but never returned back to baseline, still has asymmetry, droopy left mouth corner, in addition, he had persistent left cheek area numbness, mainly involving left upper and  lower lips, and adjacent area  Occasionally, couple times each month, he will experience sharp radiating pain, tightness along left cheek area, lasting for few minutes, he denies visual loss denies hearing loss     PHYSICAL EXAM:   Vitals:   10/18/21 0743  BP: (!) 147/93  Pulse: 89  Weight: (!) 369 lb 8 oz (167.6 kg)  Height: 5\' 11"  (1.803 m)   Not recorded     Body mass index is 51.53 kg/m.  PHYSICAL EXAMNIATION:  Gen: NAD, conversant, well nourised, well groomed                     Cardiovascular: Regular rate rhythm, no peripheral edema, warm, nontender. Eyes: Conjunctivae clear without exudates or hemorrhage Neck: Supple, no carotid bruits. Pulmonary: Clear to auscultation bilaterally   NEUROLOGICAL EXAM:  MENTAL STATUS: Speech:    Speech is normal; fluent and spontaneous with normal comprehension.  Cognition:     Orientation to time, place and person     Normal recent and remote memory     Normal Attention span and concentration     Normal Language, naming, repeating,spontaneous speech     Fund of knowledge   CRANIAL NERVES: CN II: Visual fields are full to confrontation. Pupils are round equal and briskly reactive to light. CN III, IV, VI: extraocular movement are normal. No ptosis. CN V: Facial sensation is intact to light touch, with exception of mildly decreased light touch at left upper, lower lip, left chin area, bilateral corneal reflex were symmetric.  CN VII: Moderate left frontalis, orbicularis oculi, cheek muscle, and orbicularis oris muscle weakness. CN VIII: Hearing is normal to causal conversation.  Bilateral tympanic membranes were intact. CN IX, X: Phonation is normal. CN XI: Head turning and shoulder shrug are intact  MOTOR: There is no pronator drift of out-stretched arms. Muscle bulk and tone are normal. Muscle strength is normal.  REFLEXES: Reflexes are 2+ and symmetric at the biceps, triceps, knees, and ankles. Plantar responses are  flexor.  SENSORY: Intact to light touch, pinprick and vibratory sensation are intact in fingers and toes.  COORDINATION: There is no trunk or limb dysmetria noted.  GAIT/STANCE: Posture is normal. Gait is steady with normal steps, base, arm swing, and turning. Heel and toe walking are normal. Tandem gait is normal.  Romberg is absent.  REVIEW OF SYSTEMS:  Full 14 system review of systems performed and notable only for as above All other review of systems were negative.   ALLERGIES: Allergies  Allergen Reactions   Penicillins     Rash    Sulfonamide Derivatives     REACTION: rash Tongue swelling   Sulfa Antibiotics Swelling    Tongue swelling    HOME MEDICATIONS: Current Outpatient Medications  Medication Sig Dispense Refill   amLODipine (NORVASC) 10 MG tablet Take 1 tablet (10 mg total) by mouth daily. 90 tablet 1   butalbital-acetaminophen-caffeine (FIORICET) 50-325-40 MG tablet Take 1 tablet by mouth every 6 (six) hours as needed for headache. 14 tablet 0   clonazePAM (KLONOPIN) 0.5 MG tablet Take 1-2 tablets (0.5-1 mg total) by mouth 2 (two) times daily as needed for anxiety. 10 tablet 0   emtricitabine-tenofovir AF (DESCOVY) 200-25 MG tablet Take 1 tablet by mouth daily. 30 tablet 11   hydrocortisone 2.5 % ointment APPLY 1 APPLICATION TOPICALLY IN THE MORNING AND AT BEDTIME. 20 g 4   pantoprazole (PROTONIX) 40 MG tablet TAKE 1 TABLET BY MOUTH TWICE A DAY X30 DAYS THEN ONCE DAILY 90 tablet 3   propranolol ER (INDERAL LA) 60 MG 24 hr capsule Take 1 capsule (60 mg total) by mouth daily. 90 capsule 0   Semaglutide,0.25 or 0.5MG /DOS, 2 MG/1.5ML SOPN Inject 0.5 mg into the skin once a week. (Patient not taking: Reported on 10/18/2021) 4.5 mL 0   No current facility-administered medications for this visit.    PAST MEDICAL HISTORY: Past Medical History:  Diagnosis Date   Adenoma of left adrenal gland    incidental on CT abd 10/2018.   Bell's palsy    L side.  Pain involved  (?)--got better with gabapentin.   Diabetes mellitus without complication (HCC)    no meds, hx of   Erosive gastritis 10/2018   EGD   Esophageal stenosis 10/2018   dilation done   High risk sexual behavior    sex with men: started PrEP (descovy) 08/15/19.   Hyperlipidemia    75), HDL 27, TG 105. LDL goal= <110, no per pt   Hypertension    Morbid obesity ([place])    NASH (nonalcoholic steatohepatitis)    Rapid weight loss 2020   Purposeful   Seasonal allergies    RAD with RTIs only    PAST SURGICAL HISTORY: Past Surgical History:  Procedure Laterality Date   COLONOSCOPY W/ POLYPECTOMY  10/27/2019   Polyp x 1->submucosal lipoma.  Recall 10 yrs.   ESOPHAGOGASTRODUODENOSCOPY  10/29/2018   LASIK  2000   Bilaterally   TONSILLECTOMY     UPPER GASTROINTESTINAL ENDOSCOPY  2020  FAMILY HISTORY: Family History  Problem Relation Age of Onset   Hypertension Mother    Lung cancer Maternal Grandmother    COPD Maternal Grandmother        uncle    Heart failure Maternal Grandfather    Transient ischemic attack Maternal Grandfather    Hypertension Other    Diabetes Other        MG aunt   Colon cancer Neg Hx    Prostate cancer Neg Hx    CAD Neg Hx    Esophageal cancer Neg Hx    Stomach cancer Neg Hx    Rectal cancer Neg Hx     SOCIAL HISTORY: Social History   Socioeconomic History   Marital status: Single    Spouse name: Not on file   Number of children: 0   Years of education: Not on file   Highest education level: Some college, no degree  Occupational History   Occupation: works for a Art gallery manager time   Tobacco Use   Smoking status: Never   Smokeless tobacco: Never  Vaping Use   Vaping Use: Never used  Substance and Sexual Activity   Alcohol use: Not Currently    Alcohol/week: 0.0 standard drinks   Drug use: No   Sexual activity: Not Currently  Other Topics Concern   Not on file  Social History Narrative   Single, no children.   Orig from [place].   Occup:  works part time for [employer].   No tobacco.   No alcohol.      Lives w/ mother to help her out (mother is [name])   Social Determinants of Health   Financial Resource Strain: Not on file  Food Insecurity: Not on file  Transportation Needs: Not on file  Physical Activity: Not on file  Stress: Not on file  Social Connections: Not on file  Intimate Partner Violence: Not on file      Marcial Pacas, M.D. Ph.D.  [facility] [address], [place], [place] [address] Ph: [phone] Fax: [phone]  CC:  Maximiano Coss, NP [address] [place],  [place] [address]  Maximiano Coss, NP

## 2021-10-19 LAB — TESTOSTERONE,FREE AND TOTAL
Testosterone, Free: 8.2 pg/mL (ref 7.2–24.0)
Testosterone: 245 ng/dL — ABNORMAL LOW (ref 264–916)

## 2021-10-24 ENCOUNTER — Encounter: Payer: Self-pay | Admitting: Endocrinology

## 2021-10-24 ENCOUNTER — Telehealth: Payer: Self-pay | Admitting: Neurology

## 2021-10-24 NOTE — Telephone Encounter (Signed)
[employer] [place] Auth: [place] via [employer] order faxed to [facility]. They will reach out to the patient to schedule.

## 2021-10-25 NOTE — Telephone Encounter (Signed)
[place] 10/28/21 330PM

## 2021-11-04 ENCOUNTER — Telehealth: Payer: Self-pay

## 2021-11-04 NOTE — Telephone Encounter (Signed)
Caller name:Dwayne Lewis   On DPR? :Yes  Call back number:[phone]  Provider they see: Richard  Reason for call:Semaglutide,0.25 or 0.5MG /DOS, 2 MG/1.5ML SOPN  pt is not able to get this filled [facility] ? I don't see a request and pt is wanting to this sent [id number] [place], [address], [place] [address]  pt is trying to see how much it is going to cost with them if it is to much then he may have to change location.

## 2021-11-10 NOTE — Telephone Encounter (Signed)
Yes I spoke with patient and wrote it down but left message up to document. ?

## 2021-11-14 ENCOUNTER — Telehealth: Payer: Self-pay | Admitting: Gastroenterology

## 2021-11-14 NOTE — Telephone Encounter (Signed)
Hey Dr. Fuller Plan,  ? ?Patient called in with concerns of occasionally bleeding. He was a previous patient of yours in 2021 for colonoscopy. He went to [facility] in 2021 but have since been release as they stated he did not have to be seen there for his problem. Patient would like to return to you as his primary GI provider. ? ?Thank you ?

## 2021-11-16 NOTE — Telephone Encounter (Signed)
This patient has changed his GI care back and forth between [facility] and me. [place] Please explain to him ?

## 2021-11-16 NOTE — Telephone Encounter (Signed)
Left message for patient to call back  

## 2021-11-16 NOTE — Telephone Encounter (Signed)
Dwayne Lewis,  ?Please contact this patient. He has changed his GI care back and forth between [facility] and [facility]: [facility]), 2021 [facility] then later in 2021 back to [facility]. Please explain to him our recommendation to choose one GI practice and receive all his GI care from that practice. Having one GI practice caring for his GI needs is in his best interest. OK if he wants to switch back to [facility] this time or he can stay with [facility]. If he decides on [facility] he can have an office appt with me or an APP to address his symptoms.  ?

## 2021-11-17 NOTE — Telephone Encounter (Signed)
Patient has been scheduled for a follow up visit with [name], [place] on 12/09/21 at 8:30 am. He stated he would like to continue with our practice. He states he is no longer associated with [facility], he was only seeing them while he was admitted in the hospital a while back.  ?

## 2021-12-09 ENCOUNTER — Ambulatory Visit: Payer: BC Managed Care – PPO | Admitting: Gastroenterology

## 2021-12-12 ENCOUNTER — Encounter: Payer: Self-pay | Admitting: Gastroenterology

## 2021-12-12 ENCOUNTER — Ambulatory Visit (INDEPENDENT_AMBULATORY_CARE_PROVIDER_SITE_OTHER): Payer: BC Managed Care – PPO | Admitting: Gastroenterology

## 2021-12-12 VITALS — BP 122/86 | HR 78 | Ht 71.0 in | Wt 376.2 lb

## 2021-12-12 DIAGNOSIS — K219 Gastro-esophageal reflux disease without esophagitis: Secondary | ICD-10-CM | POA: Diagnosis not present

## 2021-12-12 DIAGNOSIS — K921 Melena: Secondary | ICD-10-CM | POA: Diagnosis not present

## 2021-12-12 NOTE — Progress Notes (Signed)
? ? ?  History of Present Illness: This is a 51 year old male with intermittent rectal bleeding.  He relates episodic small-volume painless rectal bleeding with bowel movements since October 2022.  He generally has a bowel movement 2-3 times daily without straining and this pattern has not changed.  Colonoscopy performed 2 years ago as below.  He has been intermittently followed [facility] and states he would like to establish all his future GI care with [facility].  GERD and biliary dyskinesia have been diagnosed.  He states his reflux symptoms are under very good control as long as he avoids certain foods and takes pantoprazole daily. ? ?Colonoscopy 10/2019 ?- One 10 mm polyp in the descending colon, removed with a hot snare. Resected and ?retrieved. ?- Internal hemorrhoids. ?- The examination was otherwise normal on direct and retroflexion views. ?Path: lipoma  ? ?Current Medications, Allergies, Past Medical History, Past Surgical History, Family History and Social History were reviewed in [employer] record. ? ? ?Physical Exam: ?General: Well developed, well nourished, no acute distress ?Head: Normocephalic and atraumatic ?Eyes: Sclerae anicteric, EOMI ?Ears: Normal auditory acuity ?Mouth: Not examined, mask on during Covid-19 pandemic ?Lungs: Clear throughout to auscultation ?Heart: Regular rate and rhythm; no murmurs, rubs or bruits ?Abdomen: Soft, non tender and non distended. No masses, hepatosplenomegaly or hernias noted. Normal Bowel sounds ?Rectal: No lesions, no tenderness, heme negative mucous  ?Musculoskeletal: Symmetrical with no gross deformities  ?Pulses:  Normal pulses noted ?Extremities: No clubbing, cyanosis, edema or deformities noted ?Neurological: Alert oriented x 4, grossly nonfocal ?Psychological:  Alert and cooperative. Normal mood and affect ? ? ?Assessment and Recommendations: ? ?Small volume hematochezia due to bleeding internal hemorrhoids.  Preparation H  suppositories PR qd for 7 days and then 2 to 4 days qd PR as needed hemorrhoid symptoms.  If symptoms persist despite use of Preparation H for 2 to 3 months he is advised to contact us for hemorrhoid banding.  We discussed hemorrhoidal banding and I addressed his questions. ?GERD.  Follow antireflux measures and continue pantoprazole 40 mg p.o. daily. ?Biliary dyskinesia.  Currently asymptomatic. ?

## 2021-12-12 NOTE — Patient Instructions (Signed)
Purchase over the counter preparation H suppositories rectally x 7 days. You can use them as needed 2-4 days when you have active bleeding.  ? ?Please follow up with our office in one year.  ? ?The [facility] providers would like to encourage you to use [facility] to communicate with providers for non-urgent requests or questions.  Due to long hold times on the telephone, sending your provider a message by [facility] may be a faster and more efficient way to get a response.  Please allow 48 business hours for a response.  Please remember that this is for non-urgent requests.  ? ?Thank you for choosing me and [facility]. ? ?Malcolm T. Dagoberto Ligas., MD., [facility] ? ?

## 2022-01-17 ENCOUNTER — Other Ambulatory Visit: Payer: Self-pay | Admitting: Registered Nurse

## 2022-01-17 DIAGNOSIS — G43709 Chronic migraine without aura, not intractable, without status migrainosus: Secondary | ICD-10-CM

## 2022-01-27 ENCOUNTER — Telehealth: Payer: Self-pay | Admitting: Registered Nurse

## 2022-01-27 ENCOUNTER — Other Ambulatory Visit: Payer: Self-pay

## 2022-01-27 DIAGNOSIS — Z79899 Other long term (current) drug therapy: Secondary | ICD-10-CM

## 2022-01-27 MED ORDER — DESCOVY 200-25 MG PO TABS: 1.0000 | ORAL_TABLET | Freq: Every day | ORAL | 11 refills | Status: DC

## 2022-01-27 NOTE — Telephone Encounter (Signed)
Called patient to let him know that the medication has been sent.

## 2022-01-27 NOTE — Telephone Encounter (Signed)
Pt called in stating that the pharmacy states they have not received additional information for the [place], pt advise and pt uses [facility]

## 2022-01-29 ENCOUNTER — Other Ambulatory Visit: Payer: Self-pay | Admitting: Registered Nurse

## 2022-01-29 DIAGNOSIS — K219 Gastro-esophageal reflux disease without esophagitis: Secondary | ICD-10-CM

## 2022-02-01 ENCOUNTER — Telehealth: Payer: Self-pay

## 2022-02-01 NOTE — Telephone Encounter (Signed)
Received a call from [place] at [employer]. She is calling in as she is needing the last OV notes, as they received the Prior Auth form but nothing else. Asked if we can fax over this information to [phone].

## 2022-02-01 NOTE — Telephone Encounter (Signed)
Faxed to requested number the most recent pertinent visit notes

## 2022-03-10 ENCOUNTER — Ambulatory Visit: Payer: BC Managed Care – PPO | Admitting: Registered Nurse

## 2022-03-16 ENCOUNTER — Ambulatory Visit (INDEPENDENT_AMBULATORY_CARE_PROVIDER_SITE_OTHER): Payer: BC Managed Care – PPO | Admitting: Registered Nurse

## 2022-03-16 ENCOUNTER — Other Ambulatory Visit: Payer: Self-pay

## 2022-03-16 ENCOUNTER — Encounter: Payer: Self-pay | Admitting: Registered Nurse

## 2022-03-16 VITALS — BP 142/86 | HR 86 | Temp 98.3°F | Resp 18 | Ht 71.0 in | Wt 399.0 lb

## 2022-03-16 DIAGNOSIS — E1169 Type 2 diabetes mellitus with other specified complication: Secondary | ICD-10-CM

## 2022-03-16 DIAGNOSIS — E559 Vitamin D deficiency, unspecified: Secondary | ICD-10-CM | POA: Diagnosis not present

## 2022-03-16 DIAGNOSIS — K219 Gastro-esophageal reflux disease without esophagitis: Secondary | ICD-10-CM | POA: Diagnosis not present

## 2022-03-16 DIAGNOSIS — I10 Essential (primary) hypertension: Secondary | ICD-10-CM | POA: Diagnosis not present

## 2022-03-16 DIAGNOSIS — G43709 Chronic migraine without aura, not intractable, without status migrainosus: Secondary | ICD-10-CM

## 2022-03-16 DIAGNOSIS — F40240 Claustrophobia: Secondary | ICD-10-CM

## 2022-03-16 LAB — CBC WITH DIFFERENTIAL/PLATELET
Basophils Absolute: 0 10*3/uL (ref 0.0–0.1)
Basophils Relative: 0.7 % (ref 0.0–3.0)
Eosinophils Absolute: 0.1 10*3/uL (ref 0.0–0.7)
Eosinophils Relative: 1.7 % (ref 0.0–5.0)
HCT: 40.4 % (ref 39.0–52.0)
Hemoglobin: 13.1 g/dL (ref 13.0–17.0)
Lymphocytes Relative: 26.6 % (ref 12.0–46.0)
Lymphs Abs: 1.5 10*3/uL (ref 0.7–4.0)
MCHC: 32.5 g/dL (ref 30.0–36.0)
MCV: 81.1 fl (ref 78.0–100.0)
Monocytes Absolute: 0.5 10*3/uL (ref 0.1–1.0)
Monocytes Relative: 9.3 % (ref 3.0–12.0)
Neutro Abs: 3.4 10*3/uL (ref 1.4–7.7)
Neutrophils Relative %: 61.7 % (ref 43.0–77.0)
Platelets: 156 10*3/uL (ref 150.0–400.0)
RBC: 4.98 Mil/uL (ref 4.22–5.81)
RDW: 14.8 % (ref 11.5–15.5)
WBC: 5.5 10*3/uL (ref 4.0–10.5)

## 2022-03-16 LAB — COMPREHENSIVE METABOLIC PANEL
ALT: 17 U/L (ref 0–53)
AST: 21 U/L (ref 0–37)
Albumin: 4.3 g/dL (ref 3.5–5.2)
Alkaline Phosphatase: 63 U/L (ref 39–117)
BUN: 15 mg/dL (ref 6–23)
CO2: 28 mEq/L (ref 19–32)
Calcium: 9.2 mg/dL (ref 8.4–10.5)
Chloride: 101 mEq/L (ref 96–112)
Creatinine, Ser: 0.71 mg/dL (ref 0.40–1.50)
GFR: 104.86 mL/min (ref >60.00)
Glucose, Bld: 109 mg/dL — ABNORMAL HIGH (ref 70–99)
Potassium: 4 mEq/L (ref 3.5–5.1)
Sodium: 137 mEq/L (ref 135–145)
Total Bilirubin: 0.7 mg/dL (ref 0.2–1.2)
Total Protein: 7 g/dL (ref 6.0–8.3)

## 2022-03-16 LAB — LIPID PANEL
Cholesterol: 143 mg/dL (ref 0–200)
HDL: 34.5 mg/dL — ABNORMAL LOW (ref >39.00)
LDL Cholesterol: 82 mg/dL (ref 0–99)
NonHDL: 108.73
Total CHOL/HDL Ratio: 4
Triglycerides: 134 mg/dL (ref 0.0–149.0)
VLDL: 26.8 mg/dL (ref 0.0–40.0)

## 2022-03-16 LAB — POCT GLYCOSYLATED HEMOGLOBIN (HGB A1C): Hemoglobin A1C: 6.1 % — AB (ref 4.0–5.6)

## 2022-03-16 LAB — VITAMIN D 25 HYDROXY (VIT D DEFICIENCY, FRACTURES): VITD: 25.49 ng/mL — ABNORMAL LOW (ref 30.00–100.00)

## 2022-03-16 MED ORDER — CLONAZEPAM 0.5 MG PO TABS: 0.5000 mg | ORAL_TABLET | Freq: Two times a day (BID) | ORAL | 0 refills | Status: DC | PRN

## 2022-03-16 MED ORDER — AMLODIPINE BESYLATE 10 MG PO TABS: 10.0000 mg | ORAL_TABLET | Freq: Every day | ORAL | 1 refills | Status: DC

## 2022-03-16 MED ORDER — PANTOPRAZOLE SODIUM 40 MG PO TBEC
DELAYED_RELEASE_TABLET | ORAL | 1 refills | Status: AC
Start: 2022-03-16 — End: ?

## 2022-03-16 MED ORDER — PROPRANOLOL HCL ER 60 MG PO CP24
ORAL_CAPSULE | ORAL | 1 refills | Status: DC
Start: 2022-03-16 — End: 2022-05-12

## 2022-03-16 NOTE — Assessment & Plan Note (Signed)
Labs collected. Will follow up with the patient as warranted.  

## 2022-03-16 NOTE — Patient Instructions (Addendum)
[name] -   Always great to see you  No concerns with sugars.   See below for info on controlling blood pressure. Will ensure kidney function and cholesterol is stable today.   Follow up in 3-6 mo  Thanks,  Dwayne Lewis    If you have lab work done today you will be contacted with your lab results within the next 2 weeks.  If you have not heard from [place] then please contact us. The fastest way to get your results is to register for My Chart.   IF you received an x-ray today, you will receive an invoice from [facility]. Please contact [facility] at [phone] with questions or concerns regarding your invoice.   IF you received labwork today, you will receive an invoice from [facility]. Please contact [facility] at [phone] with questions or concerns regarding your invoice.   Our billing staff will not be able to assist you with questions regarding bills from these companies.  You will be contacted with the lab results as soon as they are available. The fastest way to get your results is to activate your My Chart account. Instructions are located on the last page of this paperwork. If you have not heard from [place] regarding the results in 2 weeks, please contact this office.

## 2022-03-16 NOTE — Progress Notes (Signed)
Established Patient Office Visit  Subjective:  Patient ID: Dwayne Lewis, male    DOB: [birth year]  Age: 51 y.o. MRN: [id number]  CC:  Chief Complaint  Patient presents with   Follow-up    Patient is here for a follow up    HPI Dwayne Lewis presents for 6 mo follow up   T2dm well controlled for a number of years.  Last A1c:  Lab Results  Component Value Date   HGBA1C 6.1 (A) 03/16/2022    Currently taking: lifestyle management No new complications Reports good compliance with medications Diet has been steady Exercise habits have been steady  Hypertension: Patient Currently taking: amlodipine '10mg'$  po qd, propranolol '60mg'$  24hr caps po qd.  Good effect. No AEs. Denies CV symptoms including: chest pain, shob, doe, headache, visual changes, fatigue, claudication, and dependent edema.   Previous readings and labs: BP Readings from Last 3 Encounters:  03/16/22 (!) 142/86  12/12/21 122/86  10/18/21 (!) 147/93   Lab Results  Component Value Date   CREATININE 0.75 06/10/2021    HLD assoc with htn, t2dm Lab Results  Component Value Date   CHOL 137 06/10/2021   HDL 31.00 (L) 06/10/2021   LDLCALC 73 06/10/2021   LDLDIRECT 92.0 03/30/2015   TRIG 162.0 (H) 06/10/2021   CHOLHDL 4 06/10/2021    GERD Stable on pantoprazole Avoids triggers foods when possible Takes PPI most days   Panic Attacks PRN use of clonazepam with good effect. Last fill 09/09/21 for #10 for tid prn use. Notes in June he lost an aunt, his cat, and his niece ended up in the hospital after a seizure. Good effect, no AE.  Outpatient Medications Prior to Visit  Medication Sig Dispense Refill   clonazePAM (KLONOPIN) 0.5 MG tablet Take 1-2 tablets (0.5-1 mg total) by mouth 2 (two) times daily as needed for anxiety. 10 tablet 0   emtricitabine-tenofovir AF (DESCOVY) 200-25 MG tablet Take 1 tablet by mouth daily. 30 tablet 11   Vitamin D, Ergocalciferol, (DRISDOL) 1.25 MG (50000 UNIT) CAPS  capsule Take 50,000 Units by mouth every 7 (seven) days.     amLODipine (NORVASC) 10 MG tablet Take 1 tablet (10 mg total) by mouth daily. 90 tablet 1   pantoprazole (PROTONIX) 40 MG tablet TAKE 1 TABLET BY MOUTH DAILY 180 tablet 1   propranolol ER (INDERAL LA) 60 MG 24 hr capsule TAKE 1 CAPSULE BY MOUTH EVERY DAY 90 capsule 0   No facility-administered medications prior to visit.    Review of Systems  Constitutional: Negative.   HENT: Negative.    Eyes: Negative.   Respiratory: Negative.    Cardiovascular: Negative.   Gastrointestinal: Negative.   Genitourinary: Negative.   Musculoskeletal: Negative.   Skin: Negative.   Neurological: Negative.   Psychiatric/Behavioral: Negative.    All other systems reviewed and are negative.     Objective:     BP (!) 142/86   Pulse 86   Temp 98.3 F (36.8 C) (Temporal)   Resp 18   Ht '5\' 11"'$  (1.803 m)   Wt (!) 399 lb (181 kg)   SpO2 98%   BMI 55.65 kg/m   Wt Readings from Last 3 Encounters:  03/16/22 (!) 399 lb (181 kg)  12/12/21 (!) 376 lb 4 oz (170.7 kg)  10/18/21 (!) 369 lb 8 oz (167.6 kg)   Physical Exam Constitutional:      General: He is not in acute distress.    Appearance: Normal appearance. He  is normal weight. He is not ill-appearing, toxic-appearing or diaphoretic.  Cardiovascular:     Rate and Rhythm: Normal rate and regular rhythm.     Heart sounds: Normal heart sounds. No murmur heard.    No friction rub. No gallop.  Pulmonary:     Effort: Pulmonary effort is normal. No respiratory distress.     Breath sounds: Normal breath sounds. No stridor. No wheezing, rhonchi or rales.  Chest:     Chest wall: No tenderness.  Neurological:     General: No focal deficit present.     Mental Status: He is alert and oriented to person, place, and time. Mental status is at baseline.  Psychiatric:        Mood and Affect: Mood normal.        Behavior: Behavior normal.        Thought Content: Thought content normal.         Judgment: Judgment normal.     Results for orders placed or performed in visit on 03/16/22  POCT glycosylated hemoglobin (Hb A1C)  Result Value Ref Range   Hemoglobin A1C 6.1 (A) 4.0 - 5.6 %   HbA1c POC (<> result, manual entry)     HbA1c, POC (prediabetic range)     HbA1c, POC (controlled diabetic range)        The 10-year ASCVD risk score (Arnett DK, et al., 2019) is: 12%    Assessment & Plan:   Problem List Items Addressed This Visit       Cardiovascular and Mediastinum   Essential hypertension    Borderline. Encouraged aggressive lifestyle management. Continue current medication as patient does not want to change at this time. Follow up in 3-6 mo      Relevant Medications   amLODipine (NORVASC) 10 MG tablet   propranolol ER (INDERAL LA) 60 MG 24 hr capsule   Other Relevant Orders   Comprehensive metabolic panel   Lipid panel   CBC with Differential/Platelet     Endocrine   Diabetes mellitus, type II ([place]) - Primary    Continues to be very well controlled. Labs collected. Will follow up with the patient as warranted. Follow up in 6 mo      Relevant Orders   POCT glycosylated hemoglobin (Hb A1C) (Completed)   Comprehensive metabolic panel   Lipid panel   CBC with Differential/Platelet     Other   Claustrophobia    Refill clonazepam for prn use. Discussed safe use PDMP consulted, no concerns.       Other Visit Diagnoses     Chronic migraine without aura without status migrainosus, not intractable       Relevant Medications   amLODipine (NORVASC) 10 MG tablet   propranolol ER (INDERAL LA) 60 MG 24 hr capsule   Gastroesophageal reflux disease, unspecified whether esophagitis present       Relevant Medications   pantoprazole (PROTONIX) 40 MG tablet       Meds ordered this encounter  Medications   amLODipine (NORVASC) 10 MG tablet    Sig: Take 1 tablet (10 mg total) by mouth daily.    Dispense:  90 tablet    Refill:  1    Order Specific  Question:   Supervising Provider    Answer:   Carlota Raspberry, JEFFREY R [[id number]]   propranolol ER (INDERAL LA) 60 MG 24 hr capsule    Sig: TAKE 1 CAPSULE BY MOUTH EVERY DAY    Dispense:  90 capsule    Refill:  1    Order Specific Question:   Supervising Provider    Answer:   Carlota Raspberry, JEFFREY R [[id number]]   pantoprazole (PROTONIX) 40 MG tablet    Sig: TAKE 1 TABLET BY MOUTH DAILY    Dispense:  180 tablet    Refill:  1    Order Specific Question:   Supervising Provider    Answer:   Carlota Raspberry, JEFFREY R [[id number]]    Return in about 6 months (around 09/16/2022) for Chronic Conditions.    Maximiano Coss, NP

## 2022-03-16 NOTE — Assessment & Plan Note (Signed)
Refill clonazepam for prn use. Discussed safe use PDMP consulted, no concerns.

## 2022-03-16 NOTE — Assessment & Plan Note (Signed)
Continue pantoprazole. °

## 2022-03-16 NOTE — Assessment & Plan Note (Signed)
Borderline. Encouraged aggressive lifestyle management. Continue current medication as patient does not want to change at this time. Follow up in 3-6 mo

## 2022-03-16 NOTE — Assessment & Plan Note (Signed)
Continues to be very well controlled. Labs collected. Will follow up with the patient as warranted. Follow up in 6 mo

## 2022-05-04 ENCOUNTER — Ambulatory Visit (INDEPENDENT_AMBULATORY_CARE_PROVIDER_SITE_OTHER): Payer: BC Managed Care – PPO | Admitting: Family Medicine

## 2022-05-04 ENCOUNTER — Encounter: Payer: Self-pay | Admitting: Family Medicine

## 2022-05-04 VITALS — BP 167/82 | HR 68 | Temp 97.7°F | Ht 71.0 in | Wt >= 6400 oz

## 2022-05-04 DIAGNOSIS — R531 Weakness: Secondary | ICD-10-CM | POA: Diagnosis not present

## 2022-05-04 DIAGNOSIS — R42 Dizziness and giddiness: Secondary | ICD-10-CM

## 2022-05-04 DIAGNOSIS — I10 Essential (primary) hypertension: Secondary | ICD-10-CM

## 2022-05-04 DIAGNOSIS — R519 Headache, unspecified: Secondary | ICD-10-CM | POA: Diagnosis not present

## 2022-05-04 MED ORDER — VALSARTAN 80 MG PO TABS: 80.0000 mg | ORAL_TABLET | Freq: Every day | ORAL | 0 refills | Status: DC

## 2022-05-04 NOTE — Progress Notes (Signed)
OFFICE VISIT  05/04/2022  CC:  Chief Complaint  Patient presents with   Blood Pressure   Patient is a 51 y.o. male who presents for actuating blood pressures.  HPI: Patient is set up to establish care with me on September 20 but this visit was set up urgently for high blood pressure problems.  1 week ago patient began to notice generalized weakness, feeling "off", a bit shaky, little dizzy.  Lasted throughout most of the day and while at work he was acting different so they checked his blood pressure and it was in the 170s over 100s. He has started checking blood pressure at home and it has remained in the range of 170s over 100 for the most part.  Occasionally down lower into the 130s to 150 range.  Diastolics 45W up to 388.  Heart rate 65-75.  Has had headaches on and off throughout this.  Given reports 1 episode of brief blurry vision. He did not monitor blood pressure at home prior to this.  He takes 10 mg amlodipine daily.  He notes no recent new changes in medications, foods, lifestyle habits, etc. preceding the onset of his recent symptoms and blood pressure issues. He does have a long history of Bell's palsy on the left side. He does not smoke, drink alcohol, or use any drugs.  ROS as above, plus--> no fevers, no CP, no SOB, no wheezing, no cough, no vertigo, no rashes, no melena/hematochezia.  No polyuria or polydipsia.  No myalgias or arthralgias.  No focal weakness, paresthesias, or tremors.  No hearing changes.  No dysuria or unusual/new urinary urgency or frequency.  No recent changes in lower legs. No n/v/d or abd pain.  No palpitations.     Past Medical History:  Diagnosis Date   Adenoma of left adrenal gland    incidental on CT abd 10/2018.   Bell's palsy    L side.  Pain involved (?)--got better with gabapentin.   Diabetes mellitus without complication (HCC)    no meds, hx of   Erosive gastritis 10/2018   EGD   Esophageal stenosis 10/2018   dilation done   High  risk sexual behavior    sex with men: started PrEP (descovy) 08/15/19.   Hyperlipidemia    75), HDL 27, TG 105. LDL goal= <110, no per pt   Hypertension    Morbid obesity ([place])    NASH (nonalcoholic steatohepatitis)    Rapid weight loss 2020   Purposeful   Seasonal allergies    RAD with RTIs only    Past Surgical History:  Procedure Laterality Date   COLONOSCOPY W/ POLYPECTOMY  10/27/2019   Polyp x 1->submucosal lipoma.  Recall 10 yrs.   ESOPHAGOGASTRODUODENOSCOPY  10/29/2018   LASIK  2000   Bilaterally   TONSILLECTOMY     UPPER GASTROINTESTINAL ENDOSCOPY  2020    Outpatient Medications Prior to Visit  Medication Sig Dispense Refill   amLODipine (NORVASC) 10 MG tablet Take 1 tablet (10 mg total) by mouth daily. 90 tablet 1   clonazePAM (KLONOPIN) 0.5 MG tablet Take 1-2 tablets (0.5-1 mg total) by mouth 2 (two) times daily as needed for anxiety. 10 tablet 0   emtricitabine-tenofovir AF (DESCOVY) 200-25 MG tablet Take 1 tablet by mouth daily. 30 tablet 11   pantoprazole (PROTONIX) 40 MG tablet TAKE 1 TABLET BY MOUTH DAILY 180 tablet 1   propranolol ER (INDERAL LA) 60 MG 24 hr capsule TAKE 1 CAPSULE BY MOUTH EVERY DAY (Patient not  taking: Reported on 05/04/2022) 90 capsule 1   Vitamin D, Ergocalciferol, (DRISDOL) 1.25 MG (50000 UNIT) CAPS capsule Take 50,000 Units by mouth every 7 (seven) days. (Patient not taking: Reported on 05/04/2022)     No facility-administered medications prior to visit.    Allergies  Allergen Reactions   Penicillins     Rash    Sulfonamide Derivatives     REACTION: rash Tongue swelling   Sulfa Antibiotics Swelling    Tongue swelling    ROS As per HPI  PE:    05/04/2022    4:33 PM 03/16/2022    7:36 AM 12/12/2021    8:24 AM  Vitals with BMI  Height '5\' 11"'$  '5\' 11"'$  '5\' 11"'$   Weight 402 lbs 6 oz 399 lbs 376 lbs 4 oz  BMI 56.15 29.02 11.1  Systolic 552 080 223  Diastolic 82 86 86  Pulse 68 86 78   Physical Exam  Gen: Alert, well appearing.   Patient is oriented to person, place, time, and situation. AFFECT: pleasant, lucid thought and speech. VKP:QAES: no injection, icteris, swelling, or exudate.  EOMI, PERRLA. Mouth: lips without lesion/swelling.  Oral mucosa pink and moist. Oropharynx without erythema, exudate, or swelling.  CV: RRR, no m/r/g.   LUNGS: CTA bilat, nonlabored resps, good aeration in all lung fields. EXT: no clubbing or cyanosis.  no edema.  Neuro: Weakness in left upper and lower facial muscular regions, otherwise cranial nerves all intact and symmetric.   Strength 5/5 in proximal and distal upper extremities and lower extremities bilaterally.  Mild decreased sensation in the left mandible region, otherwise no sensory deficits.  No tremor. No ataxia.   LABS:  Last CBC Lab Results  Component Value Date   WBC 5.5 03/16/2022   HGB 13.1 03/16/2022   HCT 40.4 03/16/2022   MCV 81.1 03/16/2022   MCH 26.5 10/24/2018   MCH 26.1 10/24/2018   RDW 14.8 03/16/2022   PLT 156.0 97/53/0051   Last metabolic panel Lab Results  Component Value Date   GLUCOSE 109 (H) 03/16/2022   NA 137 03/16/2022   K 4.0 03/16/2022   CL 101 03/16/2022   CO2 28 03/16/2022   BUN 15 03/16/2022   CREATININE 0.71 03/16/2022   GFRNONAA >60 10/24/2018   CALCIUM 9.2 03/16/2022   PROT 7.0 03/16/2022   ALBUMIN 4.3 03/16/2022   BILITOT 0.7 03/16/2022   ALKPHOS 63 03/16/2022   AST 21 03/16/2022   ALT 17 03/16/2022   ANIONGAP 13 10/24/2018   Lab Results  Component Value Date   HGBA1C 6.1 (A) 03/16/2022   Lab Results  Component Value Date   TSH 1.56 06/10/2021   IMPRESSION AND PLAN:  New patient/reestablishing care.  Uncontrolled hypertension, symptomatic. Add valsartan 80 mg a day.  Continue amlodipine 10 mg a day. Continue home blood pressure and heart rate monitoring 1-2 times a day and bring these in for review at recheck here with me in 5 days.  An After Visit Summary was printed and given to the patient.  FOLLOW UP: No  follow-ups on file.  Signed:  Crissie Sickles, MD           05/04/2022

## 2022-05-12 ENCOUNTER — Encounter: Payer: Self-pay | Admitting: Family Medicine

## 2022-05-12 ENCOUNTER — Ambulatory Visit (INDEPENDENT_AMBULATORY_CARE_PROVIDER_SITE_OTHER): Payer: BC Managed Care – PPO | Admitting: Family Medicine

## 2022-05-12 VITALS — BP 146/78 | HR 77 | Temp 98.6°F | Ht 71.0 in | Wt >= 6400 oz

## 2022-05-12 DIAGNOSIS — I10 Essential (primary) hypertension: Secondary | ICD-10-CM

## 2022-05-12 MED ORDER — VALSARTAN 160 MG PO TABS: 160.0000 mg | ORAL_TABLET | Freq: Every day | ORAL | 0 refills | Status: DC

## 2022-05-12 NOTE — Progress Notes (Signed)
OFFICE VISIT  05/12/2022  CC:  Chief Complaint  Patient presents with   Hypertension    Follow up;     Patient is a 51 y.o. male who presents for 1 week follow-up uncontrolled hypertension. A/P as of last visit: "Uncontrolled hypertension, symptomatic. Add valsartan 80 mg a day.  Continue amlodipine 10 mg a day. Continue home blood pressure and heart rate monitoring 1-2 times a day and bring these in for review at recheck here with me in 5 days."  INTERIM HX: Dwayne Lewis is feeling better. No problems with the valsartan. Blood pressures mostly still in the 650P to 546 systolic over 56C diastolic.  However the last couple of days have been 130s over 90s and he has felt significantly better.    Past Medical History:  Diagnosis Date   Adenoma of left adrenal gland    incidental on CT abd 10/2018.   Bell's palsy    L side.  Pain involved (?)--got better with gabapentin.   Diabetes mellitus without complication (HCC)    no meds, hx of   Erosive gastritis 10/2018   EGD   Esophageal stenosis 10/2018   dilation done   High risk sexual behavior    sex with men: started PrEP (descovy) 08/15/19.   Hyperlipidemia    75), HDL 27, TG 105. LDL goal= <110, no per pt   Hypertension    Morbid obesity ([place])    NASH (nonalcoholic steatohepatitis)    Rapid weight loss 2020   Purposeful   Seasonal allergies    RAD with RTIs only    Past Surgical History:  Procedure Laterality Date   COLONOSCOPY W/ POLYPECTOMY  10/27/2019   Polyp x 1->submucosal lipoma.  Recall 10 yrs.   ESOPHAGOGASTRODUODENOSCOPY  10/29/2018   LASIK  2000   Bilaterally   TONSILLECTOMY     UPPER GASTROINTESTINAL ENDOSCOPY  2020    Outpatient Medications Prior to Visit  Medication Sig Dispense Refill   amLODipine (NORVASC) 10 MG tablet Take 1 tablet (10 mg total) by mouth daily. 90 tablet 1   clonazePAM (KLONOPIN) 0.5 MG tablet Take 1-2 tablets (0.5-1 mg total) by mouth 2 (two) times daily as needed for anxiety. 10  tablet 0   emtricitabine-tenofovir AF (DESCOVY) 200-25 MG tablet Take 1 tablet by mouth daily. 30 tablet 11   pantoprazole (PROTONIX) 40 MG tablet TAKE 1 TABLET BY MOUTH DAILY 180 tablet 1   valsartan (DIOVAN) 80 MG tablet Take 1 tablet (80 mg total) by mouth daily. 30 tablet 0   propranolol ER (INDERAL LA) 60 MG 24 hr capsule TAKE 1 CAPSULE BY MOUTH EVERY DAY (Patient not taking: Reported on 05/04/2022) 90 capsule 1   No facility-administered medications prior to visit.    Allergies  Allergen Reactions   Penicillins     Rash    Sulfonamide Derivatives     REACTION: rash Tongue swelling   Sulfa Antibiotics Swelling    Tongue swelling    ROS As per HPI  PE:    05/12/2022    2:54 PM 05/04/2022    4:33 PM 03/16/2022    7:36 AM  Vitals with BMI  Height '5\' 11"'$  '5\' 11"'$  '5\' 11"'$   Weight 401 lbs 402 lbs 6 oz 399 lbs  BMI 55.95 12.75 17.00  Systolic 174 944 967  Diastolic 78 82 86  Pulse 77 68 86     Physical Exam  Gen: Alert, well appearing.  Patient is oriented to person, place, time, and situation. AFFECT: pleasant,  lucid thought and speech. No further exam today.  LABS:  Last metabolic panel Lab Results  Component Value Date   GLUCOSE 109 (H) 03/16/2022   NA 137 03/16/2022   K 4.0 03/16/2022   CL 101 03/16/2022   CO2 28 03/16/2022   BUN 15 03/16/2022   CREATININE 0.71 03/16/2022   GFRNONAA >60 10/24/2018   CALCIUM 9.2 03/16/2022   PROT 7.0 03/16/2022   ALBUMIN 4.3 03/16/2022   BILITOT 0.7 03/16/2022   ALKPHOS 63 03/16/2022   AST 21 03/16/2022   ALT 17 03/16/2022   ANIONGAP 13 10/24/2018   Lab Results  Component Value Date   HGBA1C 6.1 (A) 03/16/2022   IMPRESSION AND PLAN:  1 uncontrolled hypertension. Gradually improving. Increase valsartan to 160 mg a day. Continue amlodipine 10 mg a day. We will check electrolytes and creatinine at next follow-up.    2. Type 2 diabetes, diet controlled.   Most recent A1c was 6.1% about 2 months ago. Next A1c after  06/16/2022  An After Visit Summary was printed and given to the patient.  FOLLOW UP: Return for Already has appointment set for 05/31/2022.  Signed:  Crissie Sickles, MD           05/12/2022

## 2022-05-27 ENCOUNTER — Other Ambulatory Visit: Payer: Self-pay | Admitting: Family Medicine

## 2022-05-30 ENCOUNTER — Encounter: Payer: Self-pay | Admitting: Family Medicine

## 2022-05-31 ENCOUNTER — Encounter: Payer: Self-pay | Admitting: Family Medicine

## 2022-05-31 ENCOUNTER — Ambulatory Visit (INDEPENDENT_AMBULATORY_CARE_PROVIDER_SITE_OTHER): Payer: BC Managed Care – PPO | Admitting: Family Medicine

## 2022-05-31 VITALS — BP 167/70 | HR 80 | Temp 98.9°F | Ht 71.0 in | Wt >= 6400 oz

## 2022-05-31 DIAGNOSIS — Z0000 Encounter for general adult medical examination without abnormal findings: Secondary | ICD-10-CM | POA: Diagnosis not present

## 2022-05-31 DIAGNOSIS — I10 Essential (primary) hypertension: Secondary | ICD-10-CM | POA: Diagnosis not present

## 2022-05-31 DIAGNOSIS — E119 Type 2 diabetes mellitus without complications: Secondary | ICD-10-CM | POA: Diagnosis not present

## 2022-05-31 LAB — BASIC METABOLIC PANEL
BUN: 11 mg/dL (ref 6–23)
CO2: 32 mEq/L (ref 19–32)
Calcium: 8.9 mg/dL (ref 8.4–10.5)
Chloride: 101 mEq/L (ref 96–112)
Creatinine, Ser: 0.76 mg/dL (ref 0.40–1.50)
GFR: 102.57 mL/min (ref >60.00)
Glucose, Bld: 105 mg/dL — ABNORMAL HIGH (ref 70–99)
Potassium: 4.4 mEq/L (ref 3.5–5.1)
Sodium: 139 mEq/L (ref 135–145)

## 2022-05-31 NOTE — Patient Instructions (Signed)
Health Maintenance, Male Adopting a healthy lifestyle and getting preventive care are important in promoting health and wellness. Ask your health care provider about: The right schedule for you to have regular tests and exams. Things you can do on your own to prevent diseases and keep yourself healthy. What should I know about diet, weight, and exercise? Eat a healthy diet  Eat a diet that includes plenty of vegetables, fruits, low-fat dairy products, and lean protein. Do not eat a lot of foods that are high in solid fats, added sugars, or sodium. Maintain a healthy weight Body mass index (BMI) is a measurement that can be used to identify possible weight problems. It estimates body fat based on height and weight. Your health care provider can help determine your BMI and help you achieve or maintain a healthy weight. Get regular exercise Get regular exercise. This is one of the most important things you can do for your health. Most adults should: Exercise for at least 150 minutes each week. The exercise should increase your heart rate and make you sweat (moderate-intensity exercise). Do strengthening exercises at least twice a week. This is in addition to the moderate-intensity exercise. Spend less time sitting. Even light physical activity can be beneficial. Watch cholesterol and blood lipids Have your blood tested for lipids and cholesterol at 53 years of age, then have this test every 5 years. You may need to have your cholesterol levels checked more often if: Your lipid or cholesterol levels are high. You are older than 53 years of age. You are at high risk for heart disease. What should I know about cancer screening? Many types of cancers can be detected early and may often be prevented. Depending on your health history and family history, you may need to have cancer screening at various ages. This may include screening for: Colorectal cancer. Prostate cancer. Skin cancer. Lung  cancer. What should I know about heart disease, diabetes, and high blood pressure? Blood pressure and heart disease High blood pressure causes heart disease and increases the risk of stroke. This is more likely to develop in people who have high blood pressure readings or are overweight. Talk with your health care provider about your target blood pressure readings. Have your blood pressure checked: Every 3-5 years if you are 18-39 years of age. Every year if you are 40 years old or older. If you are between the ages of 65 and 75 and are a current or former smoker, ask your health care provider if you should have a one-time screening for abdominal aortic aneurysm (AAA). Diabetes Have regular diabetes screenings. This checks your fasting blood sugar level. Have the screening done: Once every three years after age 45 if you are at a normal weight and have a low risk for diabetes. More often and at a younger age if you are overweight or have a high risk for diabetes. What should I know about preventing infection? Hepatitis B If you have a higher risk for hepatitis B, you should be screened for this virus. Talk with your health care provider to find out if you are at risk for hepatitis B infection. Hepatitis C Blood testing is recommended for: Everyone born from 1945 through 1965. Anyone with known risk factors for hepatitis C. Sexually transmitted infections (STIs) You should be screened each year for STIs, including gonorrhea and chlamydia, if: You are sexually active and are younger than 53 years of age. You are older than 53 years of age and your   health care provider tells you that you are at risk for this type of infection. Your sexual activity has changed since you were last screened, and you are at increased risk for chlamydia or gonorrhea. Ask your health care provider if you are at risk. Ask your health care provider about whether you are at high risk for HIV. Your health care provider  may recommend a prescription medicine to help prevent HIV infection. If you choose to take medicine to prevent HIV, you should first get tested for HIV. You should then be tested every 3 months for as long as you are taking the medicine. Follow these instructions at home: Alcohol use Do not drink alcohol if your health care provider tells you not to drink. If you drink alcohol: Limit how much you have to 0-2 drinks a day. Know how much alcohol is in your drink. In the [place] one drink equals one 12 oz bottle of beer (355 mL), one 5 oz glass of wine (148 mL), or one 1 oz glass of hard liquor (44 mL). Lifestyle Do not use any products that contain nicotine or tobacco. These products include cigarettes, chewing tobacco, and vaping devices, such as e-cigarettes. If you need help quitting, ask your health care provider. Do not use street drugs. Do not share needles. Ask your health care provider for help if you need support or information about quitting drugs. General instructions Schedule regular health, dental, and eye exams. Stay current with your vaccines. Tell your health care provider if: You often feel depressed. You have ever been abused or do not feel safe at home. Summary Adopting a healthy lifestyle and getting preventive care are important in promoting health and wellness. Follow your health care provider's instructions about healthy diet, exercising, and getting tested or screened for diseases. Follow your health care provider's instructions on monitoring your cholesterol and blood pressure. This information is not intended to replace advice given to you by your health care provider. Make sure you discuss any questions you have with your health care provider. Document Revised: 01/17/2021 Document Reviewed: 01/17/2021 Elsevier Patient Education  2023 [employer]  

## 2022-05-31 NOTE — Progress Notes (Signed)
Office Note 05/31/2022  CC:  Chief Complaint  Patient presents with   Establish Care    Transfer of Care   Patient is a 51 y.o. male who is here for annual health maintenance exam and follow-up hypertension. I last saw him 05/12/2022. A/P as of that visit: "1 uncontrolled hypertension. Gradually improving. Increase valsartan to 160 mg a day. Continue amlodipine 10 mg a day. We will check electrolytes and creatinine at next follow-up.     2. Type 2 diabetes, diet controlled.   Most recent A1c was 6.1% about 2 months ago. Next A1c after 06/16/2022"  INTERIM HX: Feeling well. We reviewed his home blood pressures today and they are average 150/90.  Pulse average in the 70s.    Past Medical History:  Diagnosis Date   Adenoma of left adrenal gland    incidental on CT abd 10/2018.   Bell's palsy    L side.  Pain involved (?)--got better with gabapentin.   Diabetes mellitus without complication (HCC)    no meds, hx of   Erosive gastritis 10/2018   EGD   Esophageal stenosis 10/2018   dilation done   High risk sexual behavior    sex with men: started PrEP (descovy) 08/15/19.   Hyperlipidemia    75), HDL 27, TG 105. LDL goal= <110, no per pt   Hypertension    Morbid obesity ([place])    NASH (nonalcoholic steatohepatitis)    Rapid weight loss 2020   Purposeful   Seasonal allergies    RAD with RTIs only    Past Surgical History:  Procedure Laterality Date   COLONOSCOPY W/ POLYPECTOMY  10/27/2019   Polyp x 1->submucosal lipoma.  Recall 10 yrs.   ESOPHAGOGASTRODUODENOSCOPY  10/29/2018   LASIK  2000   Bilaterally   TONSILLECTOMY     UPPER GASTROINTESTINAL ENDOSCOPY  2020    Family History  Problem Relation Age of Onset   Hypertension Mother    Lung cancer Maternal Grandmother    COPD Maternal Grandmother        uncle    Heart failure Maternal Grandfather    Transient ischemic attack Maternal Grandfather    Hearing loss Maternal Grandfather    Stroke Maternal  Grandfather    Hypertension Other    Diabetes Other        MG aunt   Colon cancer Neg Hx    Prostate cancer Neg Hx    CAD Neg Hx    Esophageal cancer Neg Hx    Stomach cancer Neg Hx    Rectal cancer Neg Hx    Pancreatic cancer Neg Hx     Social History   Socioeconomic History   Marital status: Single    Spouse name: Not on file   Number of children: 0   Years of education: Not on file   Highest education level: Some college, no degree  Occupational History   Occupation: works for a Art gallery manager time   Tobacco Use   Smoking status: Never   Smokeless tobacco: Never  Vaping Use   Vaping Use: Never used  Substance and Sexual Activity   Alcohol use: Not Currently    Alcohol/week: 0.0 standard drinks of alcohol   Drug use: No   Sexual activity: Yes    Partners: Male  Other Topics Concern   Not on file  Social History Narrative   Single, no children.   Orig from [place].   Occup: works part time for [employer].  No tobacco.   No alcohol.      Lives w/ mother to help her out (mother is [name])   Social Determinants of Health   Financial Resource Strain: Low Risk  (05/08/2022)   Overall Financial Resource Strain (CARDIA)    Difficulty of Paying Living Expenses: Not hard at all  Food Insecurity: No Food Insecurity (05/08/2022)   Hunger Vital Sign    Worried About Running Out of Food in the Last Year: Never true    [place] in the Last Year: Never true  Transportation Needs: No Transportation Needs (05/08/2022)   PRAPARE - Hydrologist (Medical): No    Lack of Transportation (Non-Medical): No  Physical Activity: Unknown (05/08/2022)   Exercise Vital Sign    Days of Exercise per Week: Patient refused    Minutes of Exercise per Session: Not on file  Stress: No Stress Concern Present (05/08/2022)   [place]    Feeling of Stress : Not at all   Social Connections: Unknown (05/08/2022)   Social Connection and Isolation Panel [NHANES]    Frequency of Communication with Friends and Family: Never    Frequency of Social Gatherings with Friends and Family: Once a week    Attends Religious Services: Patient refused    Marine scientist or Organizations: No    Attends Music therapist: Not on file    Marital Status: Never married  Intimate Partner Violence: Not on file    Outpatient Medications Prior to Visit  Medication Sig Dispense Refill   amLODipine (NORVASC) 10 MG tablet Take 1 tablet (10 mg total) by mouth daily. 90 tablet 1   clonazePAM (KLONOPIN) 0.5 MG tablet Take 1-2 tablets (0.5-1 mg total) by mouth 2 (two) times daily as needed for anxiety. 10 tablet 0   emtricitabine-tenofovir AF (DESCOVY) 200-25 MG tablet Take 1 tablet by mouth daily. 30 tablet 11   pantoprazole (PROTONIX) 40 MG tablet TAKE 1 TABLET BY MOUTH DAILY 180 tablet 1   valsartan (DIOVAN) 160 MG tablet Take 1 tablet (160 mg total) by mouth daily. 30 tablet 0   No facility-administered medications prior to visit.    Allergies  Allergen Reactions   Penicillins     Rash    Sulfonamide Derivatives     REACTION: rash Tongue swelling   Sulfa Antibiotics Swelling    Tongue swelling    ROS Review of Systems  Constitutional:  Negative for appetite change, chills, fatigue and fever.  HENT:  Negative for congestion, dental problem, ear pain and sore throat.   Eyes:  Negative for discharge, redness and visual disturbance.  Respiratory:  Negative for cough, chest tightness, shortness of breath and wheezing.   Cardiovascular:  Negative for chest pain, palpitations and leg swelling.  Gastrointestinal:  Negative for abdominal pain, blood in stool, diarrhea, nausea and vomiting.  Genitourinary:  Negative for difficulty urinating, dysuria, flank pain, frequency, hematuria and urgency.  Musculoskeletal:  Negative for arthralgias, back pain, joint  swelling, myalgias and neck stiffness.  Skin:  Negative for pallor and rash.  Neurological:  Negative for dizziness, speech difficulty, weakness and headaches.  Hematological:  Negative for adenopathy. Does not bruise/bleed easily.  Psychiatric/Behavioral:  Negative for confusion and sleep disturbance. The patient is not nervous/anxious.     PE;    05/31/2022    8:55 AM 05/31/2022    8:40 AM 05/12/2022    2:54 PM  Vitals with BMI  Height  '5\' 11"'$  '5\' 11"'$   Weight  413 lbs 401 lbs  BMI  27.03 50.09  Systolic 381 829 937  Diastolic 70 68 78  Pulse  80 77     Gen: Alert, well appearing.  Patient is oriented to person, place, time, and situation. AFFECT: pleasant, lucid thought and speech. ENT: Ears: EACs clear, normal epithelium.  TMs with good light reflex and landmarks bilaterally.  Eyes: no injection, icteris, swelling, or exudate.  EOMI, PERRLA. Nose: no drainage or turbinate edema/swelling.  No injection or focal lesion.  Mouth: lips without lesion/swelling.  Oral mucosa pink and moist.  Dentition intact and without obvious caries or gingival swelling.  Oropharynx without erythema, exudate, or swelling.  Neck: supple/nontender.  No LAD, mass, or TM.  Carotid pulses 2+ bilaterally, without bruits. CV: RRR, no m/r/g.   LUNGS: CTA bilat, nonlabored resps, good aeration in all lung fields. ABD: soft, NT, ND, BS normal.  No hepatospenomegaly or mass.  No bruits. EXT: no clubbing, cyanosis, or edema.  Musculoskeletal: no joint swelling, erythema, warmth, or tenderness.  ROM of all joints intact. Skin - no sores or suspicious lesions or rashes or color changes Foot exam - no swelling, tenderness or skin or vascular lesions. Color and temperature is normal. Sensation is intact. Peripheral pulses are palpable. Toenails are normal.  Pertinent labs:  Lab Results  Component Value Date   TSH 1.56 06/10/2021   Lab Results  Component Value Date   WBC 5.5 03/16/2022   HGB 13.1 03/16/2022    HCT 40.4 03/16/2022   MCV 81.1 03/16/2022   PLT 156.0 03/16/2022   Lab Results  Component Value Date   CREATININE 0.71 03/16/2022   BUN 15 03/16/2022   NA 137 03/16/2022   K 4.0 03/16/2022   CL 101 03/16/2022   CO2 28 03/16/2022   Lab Results  Component Value Date   ALT 17 03/16/2022   AST 21 03/16/2022   ALKPHOS 63 03/16/2022   BILITOT 0.7 03/16/2022   Lab Results  Component Value Date   CHOL 143 03/16/2022   Lab Results  Component Value Date   HDL 34.50 (L) 03/16/2022   Lab Results  Component Value Date   LDLCALC 82 03/16/2022   Lab Results  Component Value Date   TRIG 134.0 03/16/2022   Lab Results  Component Value Date   CHOLHDL 4 03/16/2022   Lab Results  Component Value Date   PSA 1.23 10/13/2021   PSA 2.23 09/29/2019   Lab Results  Component Value Date   HGBA1C 6.1 (A) 03/16/2022   Last vitamin D Lab Results  Component Value Date   VD25OH 25.49 (L) 03/16/2022   ASSESSMENT AND PLAN:   #1 uncontrolled hypertension. Increase valsartan to 320 mg a day.  Continue amlodipine 10 mg a day. Electrolytes and creatinine today.  2.  Health maintenance exam: Reviewed age and gender appropriate health maintenance issues (prudent diet, regular exercise, health risks of tobacco and excessive alcohol, use of seatbelts, fire alarms in home, use of sunscreen).  Also reviewed age and gender appropriate health screening as well as vaccine recommendations. Vaccines: Tdap->declined. .  Flu->declined. Labs: BMET, microalb/cr Prostate ca screening: next PSA 10/2022 Colon ca screening: recall 2031  #3 diabetes, diet controlled.   Most recent A1c was 6.1% on 03/16/2022. Next a1c after 06/16/2022 Feet exam today normal. Bmet today and microalb/cr next time (he is unable to give sample).  An After Visit Summary was printed  and given to the patient.  FOLLOW UP:  Return in about 2 weeks (around 06/14/2022) for f/u HTN.  Signed:  Crissie Sickles, MD           05/31/2022

## 2022-06-12 ENCOUNTER — Telehealth: Payer: Self-pay | Admitting: Family Medicine

## 2022-06-12 MED ORDER — VALSARTAN 320 MG PO TABS: 320.0000 mg | ORAL_TABLET | Freq: Every day | ORAL | 1 refills | Status: DC

## 2022-06-12 NOTE — Telephone Encounter (Signed)
LM for pt to returncall

## 2022-06-12 NOTE — Telephone Encounter (Signed)
Pt called stating that his Valsartan has still not been called into his pharmacy. I confirmed the pharmacy as [facility] in [place]

## 2022-06-12 NOTE — Telephone Encounter (Signed)
Noted  

## 2022-06-12 NOTE — Telephone Encounter (Signed)
Pt was last seen 9/20, advised to f/u 2 weeks for blood pressure and increase Valsartan to '320mg'$  from '160mg'$ .    Please review and advise

## 2022-06-12 NOTE — Telephone Encounter (Signed)
Ok, rx sent

## 2022-06-12 NOTE — Telephone Encounter (Signed)
Patient returned call.  Patient aware Valsartan '320mg'$  has been sent to pharmacy.  No questions or concerns at this time.

## 2022-06-13 ENCOUNTER — Other Ambulatory Visit: Payer: Self-pay | Admitting: Family Medicine

## 2022-06-16 ENCOUNTER — Encounter: Payer: Self-pay | Admitting: Family Medicine

## 2022-06-16 ENCOUNTER — Ambulatory Visit (INDEPENDENT_AMBULATORY_CARE_PROVIDER_SITE_OTHER): Payer: BC Managed Care – PPO | Admitting: Family Medicine

## 2022-06-16 VITALS — BP 148/68 | HR 76 | Temp 98.4°F | Ht 71.0 in | Wt >= 6400 oz

## 2022-06-16 DIAGNOSIS — E119 Type 2 diabetes mellitus without complications: Secondary | ICD-10-CM

## 2022-06-16 DIAGNOSIS — I10 Essential (primary) hypertension: Secondary | ICD-10-CM

## 2022-06-16 DIAGNOSIS — R5383 Other fatigue: Secondary | ICD-10-CM | POA: Diagnosis not present

## 2022-06-16 DIAGNOSIS — R7989 Other specified abnormal findings of blood chemistry: Secondary | ICD-10-CM | POA: Diagnosis not present

## 2022-06-16 NOTE — Progress Notes (Signed)
OFFICE VISIT  06/16/2022  CC:  Chief Complaint  Patient presents with   Hypertension    Follow up    Patient is a 51 y.o. male who presents for 2-week follow-up hypertension. A/P as of last visit: "#1 uncontrolled hypertension. Increase valsartan to 320 mg a day.  Continue amlodipine 10 mg a day. Electrolytes and creatinine today"  INTERIM HX: Overall he is doing well. He does feel some brief periods of increase in fatigue and "feeling shaky inside" since getting on the 320 mg dose of valsartan. He says this is tolerable and most the time just a brief feeling.  Only occasional home blood pressure monitoring since I saw him last in he says its 353-614 systolic over 43-15 diastolic.  He says obstacles to him losing weight are for the most part feeling tired.  He is deconditioned.  The last time he exercised regularly was back in 2021.   He stopped because he felt fatigued.  He was found to have low testosterone and says this was treated with injections by an integrative medicine clinic for a while but he says the testosterone "getting lower".  He was then sent to an endocrinologist and was told that his testosterone was fine, although patient says it was in the 200s at that time. He does endorse some decreased libido as well as intermittent erectile dysfunction. He does snore but has not been told that he stops breathing in sleep.  He does not feel excessive daytime sleepiness.  His sleep feels restorative.  ROS as above, plus--> no fevers, no CP, no SOB, no wheezing, no cough, no dizziness, no HAs, no rashes, no melena/hematochezia.  No polyuria or polydipsia.  No myalgias or arthralgias.  No focal weakness, paresthesias, or tremors.  No acute vision or hearing abnormalities.  No dysuria or unusual/new urinary urgency or frequency.  No recent changes in lower legs. No n/v/d or abd pain.  No palpitations.    Past Medical History:  Diagnosis Date   Adenoma of left adrenal gland     incidental on CT abd 10/2018.   Bell's palsy    L side.  Pain involved (?)--got better with gabapentin.   Diabetes mellitus without complication (HCC)    no meds, hx of   Erosive gastritis 10/2018   EGD   Esophageal stenosis 10/2018   dilation done   High risk sexual behavior    sex with men: started PrEP (descovy) 08/15/19.   Hyperlipidemia    75), HDL 27, TG 105. LDL goal= <110, no per pt   Hypertension    Morbid obesity ([place])    NASH (nonalcoholic steatohepatitis)    Rapid weight loss 2020   Purposeful   Seasonal allergies    RAD with RTIs only    Past Surgical History:  Procedure Laterality Date   COLONOSCOPY W/ POLYPECTOMY  10/27/2019   Polyp x 1->submucosal lipoma.  Recall 10 yrs.   ESOPHAGOGASTRODUODENOSCOPY  10/29/2018   LASIK  2000   Bilaterally   TONSILLECTOMY     UPPER GASTROINTESTINAL ENDOSCOPY  2020    Outpatient Medications Prior to Visit  Medication Sig Dispense Refill   amLODipine (NORVASC) 10 MG tablet Take 1 tablet (10 mg total) by mouth daily. 90 tablet 1   clonazePAM (KLONOPIN) 0.5 MG tablet Take 1-2 tablets (0.5-1 mg total) by mouth 2 (two) times daily as needed for anxiety. 10 tablet 0   emtricitabine-tenofovir AF (DESCOVY) 200-25 MG tablet Take 1 tablet by mouth daily. 30 tablet 11  pantoprazole (PROTONIX) 40 MG tablet TAKE 1 TABLET BY MOUTH DAILY 180 tablet 1   valsartan (DIOVAN) 320 MG tablet Take 1 tablet (320 mg total) by mouth daily. 90 tablet 1   No facility-administered medications prior to visit.    Allergies  Allergen Reactions   Penicillins     Rash    Sulfonamide Derivatives     REACTION: rash Tongue swelling   Sulfa Antibiotics Swelling    Tongue swelling    ROS As per HPI  PE:    06/16/2022    9:00 AM 05/31/2022    8:55 AM 05/31/2022    8:40 AM  Vitals with BMI  Height '5\' 11"'$   '5\' 11"'$   Weight 420 lbs 3 oz  413 lbs  BMI 62.83  15.17  Systolic 616 073 710  Diastolic 68 70 68  Pulse 76  80     Physical Exam  Gen:  Alert, well appearing.  Patient is oriented to person, place, time, and situation. AFFECT: pleasant, lucid thought and speech. No further exam today.  LABS:  Last CBC Lab Results  Component Value Date   WBC 5.5 03/16/2022   HGB 13.1 03/16/2022   HCT 40.4 03/16/2022   MCV 81.1 03/16/2022   MCH 26.5 10/24/2018   MCH 26.1 10/24/2018   RDW 14.8 03/16/2022   PLT 156.0 62/69/4854   Last metabolic panel Lab Results  Component Value Date   GLUCOSE 105 (H) 05/31/2022   NA 139 05/31/2022   K 4.4 05/31/2022   CL 101 05/31/2022   CO2 32 05/31/2022   BUN 11 05/31/2022   CREATININE 0.76 05/31/2022   GFRNONAA >60 10/24/2018   CALCIUM 8.9 05/31/2022   PROT 7.0 03/16/2022   ALBUMIN 4.3 03/16/2022   BILITOT 0.7 03/16/2022   ALKPHOS 63 03/16/2022   AST 21 03/16/2022   ALT 17 03/16/2022   ANIONGAP 13 10/24/2018   Last hemoglobin A1c Lab Results  Component Value Date   HGBA1C 6.1 (A) 03/16/2022   Lab Results  Component Value Date   CHOL 143 03/16/2022   HDL 34.50 (L) 03/16/2022   LDLCALC 82 03/16/2022   LDLDIRECT 92.0 03/30/2015   TRIG 134.0 03/16/2022   CHOLHDL 4 03/16/2022   Lab Results  Component Value Date   TSH 1.56 06/10/2021   Lab Results  Component Value Date   TESTOSTERONE 245 (L) 10/13/2021   IMPRESSION AND PLAN:  #1 hypertension, improved control but still not at goal of 627 systolic. Discussed option of adding another medication today but he prefer to hold off for now. Continue valsartan and 320 mg a day and amlodipine 10 mg a day.  #2 fatigue.  Suspect mostly deconditioning. Question contribution from low testosterone. Last level in our EMR was done on 10/13/2021 and was 245. Specialist treatment in the past has not resulted in improvement in testosterone level or symptoms. We discussed possibly reevaluating this today but he chose to hold off for now.  #3 diabetes, diet controlled. Most recent A1c 3 months ago was 6.1%. His hemoglobin A1c peaked at 7.4%  back in February 2020.  Since that time his A1c's have been between 5.5 and 6.1%. Will recheck hemoglobin A1c and urine microalbumin/creatinine next follow-up in 3 months.  An After Visit Summary was printed and given to the patient.  FOLLOW UP: Return in about 3 months (around 09/16/2022) for f/u HTN. Next cpe 05/2023  Signed:  Crissie Sickles, MD           06/16/2022

## 2022-08-25 ENCOUNTER — Emergency Department (HOSPITAL_BASED_OUTPATIENT_CLINIC_OR_DEPARTMENT_OTHER)
Admission: EM | Admit: 2022-08-25 | Discharge: 2022-08-26 | Disposition: A | Discharge status: Home / Self Care | Payer: BC Managed Care – PPO | Attending: Emergency Medicine | Admitting: Emergency Medicine

## 2022-08-25 ENCOUNTER — Encounter (HOSPITAL_BASED_OUTPATIENT_CLINIC_OR_DEPARTMENT_OTHER): Payer: Self-pay | Admitting: Emergency Medicine

## 2022-08-25 ENCOUNTER — Emergency Department (HOSPITAL_BASED_OUTPATIENT_CLINIC_OR_DEPARTMENT_OTHER): Payer: BC Managed Care – PPO

## 2022-08-25 ENCOUNTER — Other Ambulatory Visit: Payer: Self-pay

## 2022-08-25 DIAGNOSIS — R079 Chest pain, unspecified: Secondary | ICD-10-CM

## 2022-08-25 DIAGNOSIS — I10 Essential (primary) hypertension: Secondary | ICD-10-CM | POA: Diagnosis not present

## 2022-08-25 LAB — BASIC METABOLIC PANEL
Anion gap: 7 (ref 5–15)
BUN: 11 mg/dL (ref 6–20)
CO2: 28 mmol/L (ref 22–32)
Calcium: 8.4 mg/dL — ABNORMAL LOW (ref 8.9–10.3)
Chloride: 104 mmol/L (ref 98–111)
Creatinine, Ser: 0.85 mg/dL (ref 0.61–1.24)
GFR, Estimated: >60 mL/min (ref >60)
Glucose, Bld: 137 mg/dL — ABNORMAL HIGH (ref 70–99)
Potassium: 4 mmol/L (ref 3.5–5.1)
Sodium: 139 mmol/L (ref 135–145)

## 2022-08-25 LAB — CBC
HCT: 39.7 % (ref 39.0–52.0)
Hemoglobin: 12.7 g/dL — ABNORMAL LOW (ref 13.0–17.0)
MCH: 27.2 pg (ref 26.0–34.0)
MCHC: 32 g/dL (ref 30.0–36.0)
MCV: 85 fL (ref 80.0–100.0)
Platelets: 172 10*3/uL (ref 150–400)
RBC: 4.67 MIL/uL (ref 4.22–5.81)
RDW: 14.2 % (ref 11.5–15.5)
WBC: 7.3 10*3/uL (ref 4.0–10.5)
nRBC: 0 % (ref 0.0–0.2)

## 2022-08-25 LAB — TROPONIN I (HIGH SENSITIVITY): Troponin I (High Sensitivity): 7 ng/L (ref <18)

## 2022-08-25 NOTE — ED Provider Notes (Signed)
[facility] Emergency Department Provider Note MRN:  [id number]  Arrival date & time: 08/26/22     Chief Complaint   Chest Pain   History of Present Illness   Dwayne Lewis is a 51 y.o. year-old male with a history of diabetes, hypertension presenting to the ED with chief complaint of chest pain.  Left-sided chest pain described as a pressure, occurring 1 hour after eating.  Associated with some shortness of breath.  Pain resolved without intervention.  No pain at this time.  No recent fever or cough, no leg pain or swelling.  Review of Systems  A thorough review of systems was obtained and all systems are negative except as noted in the HPI and PMH.   Patient's Health History    Past Medical History:  Diagnosis Date   Adenoma of left adrenal gland    incidental on CT abd 10/2018.   Bell's palsy    L side.  Pain involved (?)--got better with gabapentin.   Diabetes mellitus without complication (HCC)    hx of , no meds   Erosive gastritis 10/2018   EGD   Esophageal stenosis 10/2018   dilation done   High risk sexual behavior    sex with men: started PrEP (descovy) 08/15/19.   Hyperlipidemia    75), HDL 27, TG 105. LDL goal= <110, no per pt   Hypertension    Low testosterone    2020/21   Morbid obesity (HCC)    BMI>50   NASH (nonalcoholic steatohepatitis)    Rapid weight loss 2020   Purposeful   Seasonal allergies    RAD with RTIs only    Past Surgical History:  Procedure Laterality Date   COLONOSCOPY W/ POLYPECTOMY  10/27/2019   Polyp x 1->submucosal lipoma.  Recall 10 yrs.   ESOPHAGOGASTRODUODENOSCOPY  10/29/2018   LASIK  2000   Bilaterally   TONSILLECTOMY     UPPER GASTROINTESTINAL ENDOSCOPY  2020    Family History  Problem Relation Age of Onset   Hypertension Mother    Lung cancer Maternal Grandmother    COPD Maternal Grandmother        uncle    Heart failure Maternal Grandfather    Transient ischemic attack Maternal  Grandfather    Hearing loss Maternal Grandfather    Stroke Maternal Grandfather    Hypertension Other    Diabetes Other        MG aunt   Colon cancer Neg Hx    Prostate cancer Neg Hx    CAD Neg Hx    Esophageal cancer Neg Hx    Stomach cancer Neg Hx    Rectal cancer Neg Hx    Pancreatic cancer Neg Hx     Social History   Socioeconomic History   Marital status: Single    Spouse name: Not on file   Number of children: 0   Years of education: Not on file   Highest education level: Some college, no degree  Occupational History   Occupation: works for a Art gallery manager time   Tobacco Use   Smoking status: Never   Smokeless tobacco: Never  Vaping Use   Vaping Use: Never used  Substance and Sexual Activity   Alcohol use: Not Currently    Alcohol/week: 0.0 standard drinks of alcohol   Drug use: No   Sexual activity: Yes    Partners: Male  Other Topics Concern   Not on file  Social History Narrative   Single,  no children.   Orig from [place].   Occup: works part time for [employer].   No tobacco.   No alcohol.      Lives w/ mother to help her out (mother is [name])   Social Determinants of Health   Financial Resource Strain: Low Risk  (05/08/2022)   Overall Financial Resource Strain (CARDIA)    Difficulty of Paying Living Expenses: Not hard at all  Food Insecurity: No Food Insecurity (05/08/2022)   Hunger Vital Sign    Worried About Running Out of Food in the Last Year: Never true    [place] in the Last Year: Never true  Transportation Needs: No Transportation Needs (05/08/2022)   PRAPARE - Hydrologist (Medical): No    Lack of Transportation (Non-Medical): No  Physical Activity: Unknown (05/08/2022)   Exercise Vital Sign    Days of Exercise per Week: Patient refused    Minutes of Exercise per Session: Not on file  Stress: No Stress Concern Present (05/08/2022)   [place]    Feeling of Stress : Not at all  Social Connections: Unknown (05/08/2022)   Social Connection and Isolation Panel [NHANES]    Frequency of Communication with Friends and Family: Never    Frequency of Social Gatherings with Friends and Family: Once a week    Attends Religious Services: Patient refused    Active Member of Clubs or Organizations: No    Attends Music therapist: Not on file    Marital Status: Never married  Intimate Partner Violence: Not on file     Physical Exam   Vitals:   08/25/22 2136 08/25/22 2300  BP: (!) 163/82 (!) 152/60  Pulse: 82 77  Resp: (!) 24 17  Temp: 98.3 F (36.8 C)   SpO2: 98% 95%    CONSTITUTIONAL: Well-appearing, NAD NEURO/PSYCH:  Alert and oriented x 3, no focal deficits EYES:  eyes equal and reactive ENT/NECK:  no LAD, no JVD CARDIO: Regular rate, well-perfused, normal S1 and S2 PULM:  CTAB no wheezing or rhonchi GI/GU:  non-distended, non-tender MSK/SPINE:  No gross deformities, no edema SKIN:  no rash, atraumatic   *Additional and/or pertinent findings included in MDM below  Diagnostic and Interventional Summary    EKG Interpretation  Date/Time:  Friday August 25 2022 21:31:28 EST Ventricular Rate:  85 PR Interval:  154 QRS Duration: 104 QT Interval:  374 QTC Calculation: 445 R Axis:   21 Text Interpretation: Normal sinus rhythm Normal ECG When compared with ECG of 24-Oct-2018 20:49, PREVIOUS ECG IS PRESENT Confirmed by Gerlene Fee [phone]) on 08/25/2022 11:14:49 PM       Labs Reviewed  BASIC METABOLIC PANEL - Abnormal; Notable for the following components:      Result Value   Glucose, Bld 137 (*)    Calcium 8.4 (*)    All other components within normal limits  CBC - Abnormal; Notable for the following components:   Hemoglobin 12.7 (*)    All other components within normal limits  TROPONIN I (HIGH SENSITIVITY)  TROPONIN I (HIGH SENSITIVITY)    DG Chest 2 View   Final Result      Medications - No data to display   Procedures  /  Critical Care Procedures  ED Course and Medical Decision Making  Initial Impression and Ddx Chest pain, differential diagnosis includes ACS, GERD, doubt PE, doubt dissection.  Patient  is pain-free at this time, reassuring vital signs.  Clear lungs.  Has a few cardiac risk factors but not significantly high risk, suspect with 2 negative troponins patient will be appropriate for discharge with cardiology follow-up.  Past medical/surgical history that increases complexity of ED encounter: Obesity, hypertension  Interpretation of Diagnostics I personally reviewed the EKG and my interpretation is as follows: Sinus rhythm without concerning ischemic features  Labs are reassuring with no significant blood count or electrolyte disturbance.  Troponin is negative  Patient Reassessment and Ultimate Disposition/Management     Patient continues to look and feel well with normal vital signs.  He does not want to wait for second troponin.  His pain occurred about 2-1/2 hours prior to collection of the first troponin.  I discussed with him the reasoning for the second troponin and the importance of finding delayed evidence of heart damage.  Still he is unwilling to stay.  Overall my concern for ACS is low and so I do not think I need to discharge him [place] given that we had a thorough conversation of the risks and benefits of the second troponin..  Strict return precautions, referring to cardiology.  Patient management required discussion with the following services or consulting groups:  None  Complexity of Problems Addressed Acute illness or injury that poses threat of life of bodily function  Additional Data Reviewed and Analyzed Further history obtained from: Further history from spouse/family member  Additional Factors Impacting ED Encounter Risk None  Shamari Lofquist. [place] Small, [place] [email or web address]  Final Clinical Impressions(s) / ED Diagnoses     ICD-10-CM   1. Chest pain, unspecified type  R07.9 Ambulatory referral to Cardiology      ED Discharge Orders          Ordered    Ambulatory referral to Cardiology        08/26/22 0033             Discharge Instructions Discussed with and Provided to Patient:     Discharge Instructions      You were evaluated in the Emergency Department and after careful evaluation, we did not find any emergent condition requiring admission or further testing in the hospital.  Your exam/testing today was overall reassuring.  Recommend follow-up with primary care and/or cardiology to discuss your symptoms.  Please return to the Emergency Department if you experience any worsening of your condition.  Thank you for allowing [place] to be a part of your care.        Maudie Flakes, MD 08/26/22 [phone]

## 2022-08-25 NOTE — ED Triage Notes (Signed)
Patient reports chest pain approximately a few hours ago, EMS came out to evaluate, and chest pain resolved. Patient c/o left sided chest pain at present, tightness, and shortness of breath. Denies any n/v.

## 2022-08-26 NOTE — Discharge Instructions (Signed)
You were evaluated in the Emergency Department and after careful evaluation, we did not find any emergent condition requiring admission or further testing in the hospital.  Your exam/testing today was overall reassuring.  Recommend follow-up with primary care and/or cardiology to discuss your symptoms.  Please return to the Emergency Department if you experience any worsening of your condition.  Thank you for allowing [place] to be a part of your care.

## 2022-08-30 ENCOUNTER — Ambulatory Visit (INDEPENDENT_AMBULATORY_CARE_PROVIDER_SITE_OTHER): Payer: BC Managed Care – PPO | Admitting: Family Medicine

## 2022-08-30 ENCOUNTER — Encounter: Payer: Self-pay | Admitting: Family Medicine

## 2022-08-30 VITALS — BP 147/69 | HR 73 | Temp 98.4°F | Ht 70.0 in | Wt >= 6400 oz

## 2022-08-30 DIAGNOSIS — I10 Essential (primary) hypertension: Secondary | ICD-10-CM | POA: Diagnosis not present

## 2022-08-30 DIAGNOSIS — R072 Precordial pain: Secondary | ICD-10-CM

## 2022-08-30 MED ORDER — METOPROLOL SUCCINATE ER 50 MG PO TB24: 50.0000 mg | ORAL_TABLET | Freq: Every day | ORAL | 1 refills | Status: DC

## 2022-08-30 NOTE — Progress Notes (Signed)
OFFICE VISIT  08/30/2022  CC:  Chief Complaint  Patient presents with   Shortness of Breath          Patient is a 51 y.o. male who presents for follow-up of ED visit 08/25/2022 for chest pain.  HPI:  I reviewed the ED encounter today.  Diagnosis was chest pain, unspecified type.  A referral to cardiology was ordered at that time.  Troponin was negative x 1.  Patient refused to stay for the repeat troponin.  ECG was normal.  Chest x-ray was normal.  In recounting the history of present illness patient states 5 days ago he was in his kitchen cleaning up after dinner and got acute onset of severe left-sided chest pain and felt short of breath.  He had no nausea, no diaphoresis, no dizziness, no palpitations. They called EMS and by the time EMS got there his chest pain had resolved. Later that evening it returned and his sister took him to the emergency department.  In the week or so prior to his chest pain he had developed a upper respiratory illness.  He went to an urgent care and was prescribed prednisone.  He has had a little bit of residual/waxing and waning malaise, gravelly voice, sneezing, and cough since that respiratory infection. Additionally, he has chronic left facial nerve palsy and he feels like this has gotten a little bit worse with his recent respiratory illness.  Since going home from the emergency department 5 days ago he has not had any recurrence of his chest pain.  He has not been monitoring his blood pressure at home since I last saw him over 2 months ago.  ROS as above, plus-->no rashes, no melena/hematochezia.  No polyuria or polydipsia.  No myalgias or arthralgias.  No focal weakness other than his chronic left sided facial weakness.  No paresthesias, or tremors.  No acute vision or hearing abnormalities.  No dysuria or unusual/new urinary urgency or frequency.  No recent changes in lower legs. No n/v/d or abd pain.  No palpitations.    Past Medical History:   Diagnosis Date   Adenoma of left adrenal gland    incidental on CT abd 10/2018.   Bell's palsy    L side.  Pain involved (?)--got better with gabapentin.   Diabetes mellitus without complication (HCC)    hx of , no meds   Erosive gastritis 10/2018   EGD   Esophageal stenosis 10/2018   dilation done   High risk sexual behavior    sex with men: started PrEP (descovy) 08/15/19.   Hyperlipidemia    75), HDL 27, TG 105. LDL goal= <110, no per pt   Hypertension    Low testosterone    2020/21   Morbid obesity (HCC)    BMI>50   NASH (nonalcoholic steatohepatitis)    Rapid weight loss 2020   Purposeful   Seasonal allergies    RAD with RTIs only    Past Surgical History:  Procedure Laterality Date   COLONOSCOPY W/ POLYPECTOMY  10/27/2019   Polyp x 1->submucosal lipoma.  Recall 10 yrs.   ESOPHAGOGASTRODUODENOSCOPY  10/29/2018   LASIK  2000   Bilaterally   TONSILLECTOMY     UPPER GASTROINTESTINAL ENDOSCOPY  2020    Outpatient Medications Prior to Visit  Medication Sig Dispense Refill   amLODipine (NORVASC) 10 MG tablet Take 1 tablet (10 mg total) by mouth daily. 90 tablet 1   clonazePAM (KLONOPIN) 0.5 MG tablet Take 1-2 tablets (0.5-1 mg  total) by mouth 2 (two) times daily as needed for anxiety. 10 tablet 0   emtricitabine-tenofovir AF (DESCOVY) 200-25 MG tablet Take 1 tablet by mouth daily. 30 tablet 11   pantoprazole (PROTONIX) 40 MG tablet TAKE 1 TABLET BY MOUTH DAILY 180 tablet 1   valsartan (DIOVAN) 320 MG tablet Take 1 tablet (320 mg total) by mouth daily. 90 tablet 1   No facility-administered medications prior to visit.    Allergies  Allergen Reactions   Penicillins     Rash    Sulfonamide Derivatives     REACTION: rash Tongue swelling   Sulfa Antibiotics Swelling    Tongue swelling    Review of Systems  As per HPI  PE:    08/30/2022    3:17 PM 08/25/2022   11:00 PM 08/25/2022    9:36 PM  Vitals with BMI  Height '5\' 10"'$     Weight 422 lbs 10 oz     BMI 29.92    Systolic 426 834 196  Diastolic 63 60 82  Pulse 73 77 82   Initial bp 173/63 today, HR 75 Rpt at end of visit 143/69  Physical Exam  Gen: Alert, well appearing.  Patient is oriented to person, place, time, and situation. AFFECT: pleasant, lucid thought and speech. CV: RRR, no m/r/g.   LUNGS: CTA bilat, nonlabored resps, good aeration in all lung fields. No chest wall tenderness to palpation. He has left eye and mouth droop, chronic.  Otherwise cranial nerves completely intact bilaterally.   LABS:  Last CBC Lab Results  Component Value Date   WBC 7.3 08/25/2022   HGB 12.7 (L) 08/25/2022   HCT 39.7 08/25/2022   MCV 85.0 08/25/2022   MCH 27.2 08/25/2022   RDW 14.2 08/25/2022   PLT 172 22/29/7989   Last metabolic panel Lab Results  Component Value Date   GLUCOSE 137 (H) 08/25/2022   NA 139 08/25/2022   K 4.0 08/25/2022   CL 104 08/25/2022   CO2 28 08/25/2022   BUN 11 08/25/2022   CREATININE 0.85 08/25/2022   GFRNONAA >60 08/25/2022   CALCIUM 8.4 (L) 08/25/2022   PROT 7.0 03/16/2022   ALBUMIN 4.3 03/16/2022   BILITOT 0.7 03/16/2022   ALKPHOS 63 03/16/2022   AST 21 03/16/2022   ALT 17 03/16/2022   ANIONGAP 7 08/25/2022   IMPRESSION AND PLAN:  #1 acute chest pain.  Basic ED workup reassuring 5 days ago. Suspect noncardiac.  Possibly due to recent viral resp syndrome. He does have an appointment to initiate care with Dr. Acie Fredrickson in cardiology on 09/29/2022.  #2 uncontrolled hypertension. Add Toprol-XL 50 mg a day.  Continue valsartan 320 mg a day and amlodipine 10 mg a day.  An After Visit Summary was printed and given to the patient.  FOLLOW UP: Return for keep f/u already set for 09/22/22.  Signed:  Crissie Sickles, MD           08/30/2022

## 2022-09-21 ENCOUNTER — Other Ambulatory Visit: Payer: Self-pay | Admitting: Family Medicine

## 2022-09-22 ENCOUNTER — Encounter: Payer: Self-pay | Admitting: Family Medicine

## 2022-09-22 ENCOUNTER — Ambulatory Visit (INDEPENDENT_AMBULATORY_CARE_PROVIDER_SITE_OTHER): Payer: BC Managed Care – PPO | Admitting: Family Medicine

## 2022-09-22 VITALS — BP 160/72 | HR 78 | Temp 98.8°F | Ht 70.0 in | Wt >= 6400 oz

## 2022-09-22 DIAGNOSIS — E119 Type 2 diabetes mellitus without complications: Secondary | ICD-10-CM

## 2022-09-22 DIAGNOSIS — I10 Essential (primary) hypertension: Secondary | ICD-10-CM | POA: Diagnosis not present

## 2022-09-22 LAB — POCT GLYCOSYLATED HEMOGLOBIN (HGB A1C)
HbA1c POC (<> result, manual entry): 6.2 % (ref 4.0–5.6)
HbA1c, POC (controlled diabetic range): 6.2 % (ref 0.0–7.0)
HbA1c, POC (prediabetic range): 6.2 % (ref 5.7–6.4)
Hemoglobin A1C: 6.2 % — AB (ref 4.0–5.6)

## 2022-09-22 MED ORDER — HYDRALAZINE HCL 25 MG PO TABS: 25.0000 mg | ORAL_TABLET | Freq: Three times a day (TID) | ORAL | 1 refills | Status: DC

## 2022-09-22 MED ORDER — VALSARTAN 320 MG PO TABS: 320.0000 mg | ORAL_TABLET | Freq: Every day | ORAL | 1 refills | Status: DC

## 2022-09-22 MED ORDER — AMLODIPINE BESYLATE 10 MG PO TABS: 10.0000 mg | ORAL_TABLET | Freq: Every day | ORAL | 1 refills | Status: DC

## 2022-09-22 NOTE — Patient Instructions (Signed)
STOP METOPROLOL

## 2022-09-22 NOTE — Progress Notes (Signed)
OFFICE VISIT  09/22/2022  CC: f/u HTN  Patient is a 52 y.o. male who presents for 3 weeks follow-up uncontrolled hypertension as well as diet-controlled diabetes. A/P as of last visit: "#1 acute chest pain.  Basic ED workup reassuring 5 days ago. Suspect noncardiac.  Possibly due to recent viral resp syndrome. He does have an appointment to initiate care with Dr. Acie Fredrickson in cardiology on 09/29/2022.   #2 uncontrolled hypertension. Add Toprol-XL 50 mg a day.  Continue valsartan 320 mg a day and amlodipine 10 mg a day."  INTERIM HX: Dwayne Lewis feels increased fatigue and cloudy mentation since getting on Toprol.  For the last couple weeks has been working hard on [employer], has lost about 7 pounds. His goal is to lose weight and get off of blood pressure medication altogether. He walks 2 days a week for exercise.  Home blood pressures 967R to 916B systolic over 84Y diastolic, pulse 65-99. No home glucose monitoring.  Past Medical History:  Diagnosis Date   Adenoma of left adrenal gland    incidental on CT abd 10/2018.   Bell's palsy    L side.  Pain involved (?)--got better with gabapentin.   Diabetes mellitus without complication (HCC)    hx of , no meds   Erosive gastritis 10/2018   EGD   Esophageal stenosis 10/2018   dilation done   High risk sexual behavior    sex with men: started PrEP (descovy) 08/15/19.   Hyperlipidemia    75), HDL 27, TG 105. LDL goal= <110, no per pt   Hypertension    Low testosterone    2020/21   Morbid obesity (HCC)    BMI>50   NASH (nonalcoholic steatohepatitis)    Rapid weight loss 2020   Purposeful   Seasonal allergies    RAD with RTIs only    Past Surgical History:  Procedure Laterality Date   COLONOSCOPY W/ POLYPECTOMY  10/27/2019   Polyp x 1->submucosal lipoma.  Recall 10 yrs.   ESOPHAGOGASTRODUODENOSCOPY  10/29/2018   LASIK  2000   Bilaterally   TONSILLECTOMY     UPPER GASTROINTESTINAL ENDOSCOPY  2020    Outpatient Medications  Prior to Visit  Medication Sig Dispense Refill   clonazePAM (KLONOPIN) 0.5 MG tablet Take 1-2 tablets (0.5-1 mg total) by mouth 2 (two) times daily as needed for anxiety. 10 tablet 0   emtricitabine-tenofovir AF (DESCOVY) 200-25 MG tablet Take 1 tablet by mouth daily. 30 tablet 11   pantoprazole (PROTONIX) 40 MG tablet TAKE 1 TABLET BY MOUTH DAILY 180 tablet 1   amLODipine (NORVASC) 10 MG tablet Take 1 tablet (10 mg total) by mouth daily. 90 tablet 1   metoprolol succinate (TOPROL-XL) 50 MG 24 hr tablet Take 1 tablet (50 mg total) by mouth daily. Take with or immediately following a meal. 30 tablet 1   valsartan (DIOVAN) 320 MG tablet Take 1 tablet (320 mg total) by mouth daily. 90 tablet 1   No facility-administered medications prior to visit.    Allergies  Allergen Reactions   Penicillins     Rash    Sulfonamide Derivatives     REACTION: rash Tongue swelling   Sulfa Antibiotics Swelling    Tongue swelling    Review of Systems As per HPI  PE:    09/22/2022    8:43 AM 08/30/2022    3:59 PM 08/30/2022    3:17 PM  Vitals with BMI  Height '5\' 10"'$   '5\' 10"'$   Weight 417 lbs  10 oz  422 lbs 10 oz  BMI 16.01  09.32  Systolic 355 732 202  Diastolic 67 69 63  Pulse 78  73     Physical Exam  Gen: Alert, well appearing.  Patient is oriented to person, place, time, and situation. AFFECT: pleasant, lucid thought and speech. CV: RRR, no m/r/g.   LUNGS: CTA bilat, nonlabored resps, good aeration in all lung fields. EXT: no clubbing or cyanosis.  No pitting edema.    LABS:  Last CBC Lab Results  Component Value Date   WBC 7.3 08/25/2022   HGB 12.7 (L) 08/25/2022   HCT 39.7 08/25/2022   MCV 85.0 08/25/2022   MCH 27.2 08/25/2022   RDW 14.2 08/25/2022   PLT 172 54/27/0623   Last metabolic panel Lab Results  Component Value Date   GLUCOSE 137 (H) 08/25/2022   NA 139 08/25/2022   K 4.0 08/25/2022   CL 104 08/25/2022   CO2 28 08/25/2022   BUN 11 08/25/2022   CREATININE  0.85 08/25/2022   GFRNONAA >60 08/25/2022   CALCIUM 8.4 (L) 08/25/2022   PROT 7.0 03/16/2022   ALBUMIN 4.3 03/16/2022   BILITOT 0.7 03/16/2022   ALKPHOS 63 03/16/2022   AST 21 03/16/2022   ALT 17 03/16/2022   ANIONGAP 7 08/25/2022   Last lipids Lab Results  Component Value Date   CHOL 143 03/16/2022   HDL 34.50 (L) 03/16/2022   LDLCALC 82 03/16/2022   LDLDIRECT 92.0 03/30/2015   TRIG 134.0 03/16/2022   CHOLHDL 4 03/16/2022   Last hemoglobin A1c Lab Results  Component Value Date   HGBA1C 6.1 (A) 03/16/2022   IMPRESSION AND PLAN:  #1 uncontrolled hypertension. Intolerant of Toprol. Would like to add HCTZ but patient has history of sulfa allergy which caused swelling of the tongue. Will start hydralazine 25 mg 3 times daily. Continue valsartan 320 mg a day and amlodipine 10 mg a day.  #2 diabetes, diet controlled, no complications. Point-of-care hemoglobin A1c today is 6.2%. Will check urine microalbumin/creatinine at next follow-up visit. He will continue his current efforts at improving diet and exercise. No glycemic medication at this time.  An After Visit Summary was printed and given to the patient.  FOLLOW UP: Return in about 3 weeks (around 10/13/2022) for f/u HTN.  Signed:  Crissie Sickles, MD           09/22/2022

## 2022-09-28 ENCOUNTER — Encounter: Payer: Self-pay | Admitting: Cardiovascular Disease

## 2022-09-28 ENCOUNTER — Other Ambulatory Visit (HOSPITAL_COMMUNITY): Payer: Self-pay

## 2022-09-28 NOTE — Progress Notes (Signed)
Cardiology Office Note:    Date:  09/29/2022   ID:  Dwayne Lewis, DOB 1971-06-25, MRN [id number]  PCP:  Tammi Sou, MD   [place] Prov[facility] Cardiologist:  Angell Pincock     Referring MD: Maudie Flakes, MD   Chief Complaint  Patient presents with   Hypertension         History of Present Illness:    Dwayne Lewis is a 52 y.o. male with a hx of morbid obesity, HTN, DM Had CP,  work up in the ER was negative,  troponin's were negative  Was having severe dyspnea These CP occur spontaneously ,   Has severe dyspnea,  has left sided chest pain, not pleuretic  Might last 10 minutes,  occurs suddenly  Not related to twisting or turning his torso  Went to the ER Dec. 15. Troponin was negative  CXR was normal      Walks every day , 10 minutes a day  No pain with walking, some DOE   He was diagnosed with hypertension this past year.  He is on amlodipine, hydralazine, Diovan. Just started watching his salt intake , just started his walking program .   Avoids processed meats , avoids salt  Had his dog put down this am [employer]) , had for 16 years .   Has lost 11 lbs since jan. 1   Family hx  Mother has heart disease  Mat. Grandfather had strokes   Will start metoprolol 25 mg p.o. twice daily for his tachycardia.  Will also start hydrochlorothiazide 25 mg a day and potassium chloride 10 mill equivalents twice a day.  I will see him again in 3 to 4 months for follow-up visit.  Will check electrolytes in 3 weeks.    Past Medical History:  Diagnosis Date   Adenoma of left adrenal gland    incidental on CT abd 10/2018.   Bell's palsy    L side.  Pain involved (?)--got better with gabapentin.   Diabetes mellitus without complication (HCC)    hx of , no meds   Erosive gastritis 10/2018   EGD   Esophageal stenosis 10/2018   dilation done   High risk sexual behavior    sex with men: started PrEP (descovy) 08/15/19.   Hyperlipidemia    75), HDL  27, TG 105. LDL goal= <110, no per pt   Hypertension    Low testosterone    2020/21   Morbid obesity (HCC)    BMI>50   NASH (nonalcoholic steatohepatitis)    Rapid weight loss 2020   Purposeful   Seasonal allergies    RAD with RTIs only    Past Surgical History:  Procedure Laterality Date   COLONOSCOPY W/ POLYPECTOMY  10/27/2019   Polyp x 1->submucosal lipoma.  Recall 10 yrs.   ESOPHAGOGASTRODUODENOSCOPY  10/29/2018   LASIK  2000   Bilaterally   TONSILLECTOMY     UPPER GASTROINTESTINAL ENDOSCOPY  2020    Current Medications: Current Meds  Medication Sig   amLODipine (NORVASC) 10 MG tablet Take 1 tablet (10 mg total) by mouth daily.   clonazePAM (KLONOPIN) 0.5 MG tablet Take 1-2 tablets (0.5-1 mg total) by mouth 2 (two) times daily as needed for anxiety.   emtricitabine-tenofovir AF (DESCOVY) 200-25 MG tablet Take 1 tablet by mouth daily.   hydrALAZINE (APRESOLINE) 25 MG tablet Take 1 tablet (25 mg total) by mouth 3 (three) times daily.   hydrochlorothiazide (HYDRODIURIL) 25 MG tablet  Take 1 tablet (25 mg total) by mouth daily.   metoprolol tartrate (LOPRESSOR) 25 MG tablet Take 1 tablet (25 mg total) by mouth 2 (two) times daily.   pantoprazole (PROTONIX) 40 MG tablet TAKE 1 TABLET BY MOUTH DAILY   potassium chloride (KLOR-CON) 10 MEQ tablet Take 1 tablet (10 mEq total) by mouth 2 (two) times daily.   valsartan (DIOVAN) 320 MG tablet Take 1 tablet (320 mg total) by mouth daily.     Allergies:   Penicillins, Sulfonamide derivatives, and Sulfa antibiotics   Social History   Socioeconomic History   Marital status: Single    Spouse name: Not on file   Number of children: 0   Years of education: Not on file   Highest education level: Some college, no degree  Occupational History   Occupation: works for a Art gallery manager time   Tobacco Use   Smoking status: Never   Smokeless tobacco: Never  Vaping Use   Vaping Use: Never used  Substance and Sexual Activity   Alcohol use:  Not Currently    Alcohol/week: 0.0 standard drinks of alcohol   Drug use: No   Sexual activity: Yes    Partners: Male  Other Topics Concern   Not on file  Social History Narrative   Single, no children.   Orig from [place].   Occup: works part time for [employer].   No tobacco.   No alcohol.      Lives w/ mother to help her out (mother is Dwayne Lewis)   Social Determinants of Health   Financial Resource Strain: Low Risk  (05/08/2022)   Overall Financial Resource Strain (CARDIA)    Difficulty of Paying Living Expenses: Not hard at all  Food Insecurity: No Food Insecurity (05/08/2022)   Hunger Vital Sign    Worried About Running Out of Food in the Last Year: Never true    Epes in the Last Year: Never true  Transportation Needs: No Transportation Needs (05/08/2022)   PRAPARE - Hydrologist (Medical): No    Lack of Transportation (Non-Medical): No  Physical Activity: Unknown (05/08/2022)   Exercise Vital Sign    Days of Exercise per Week: Patient refused    Minutes of Exercise per Session: Not on file  Stress: No Stress Concern Present (05/08/2022)   [place]    Feeling of Stress : Not at all  Social Connections: Unknown (05/08/2022)   Social Connection and Isolation Panel [NHANES]    Frequency of Communication with Friends and Family: Never    Frequency of Social Gatherings with Friends and Family: Once a week    Attends Religious Services: Patient refused    Marine scientist or Organizations: No    Attends Music therapist: Not on file    Marital Status: Never married     Family History: The patient's family history includes COPD in his maternal grandmother; Diabetes in an other family member; Hearing loss in his maternal grandfather; Heart failure in his maternal grandfather; Hypertension in his mother and another family member;  Lung cancer in his maternal grandmother; Stroke in his maternal grandfather; Transient ischemic attack in his maternal grandfather. There is no history of Colon cancer, Prostate cancer, CAD, Esophageal cancer, Stomach cancer, Rectal cancer, or Pancreatic cancer.  ROS:   Please see the history of present illness.     All other systems reviewed and are  negative.  EKGs/Labs/Other Studies Reviewed:    The following studies were reviewed today:   EKG: August 25, 2022: Normal sinus rhythm at 85.  No ST or T wave changes.  Recent Labs: 03/16/2022: ALT 17 08/25/2022: BUN 11; Creatinine, Ser 0.85; Hemoglobin 12.7; Platelets 172; Potassium 4.0; Sodium 139  Recent Lipid Panel    Component Value Date/Time   CHOL 143 03/16/2022 0812   TRIG 134.0 03/16/2022 0812   HDL 34.50 (L) 03/16/2022 0812   CHOLHDL 4 03/16/2022 0812   VLDL 26.8 03/16/2022 0812   LDLCALC 82 03/16/2022 0812   LDLDIRECT 92.0 03/30/2015 1602     Risk Assessment/Calculations:      HYPERTENSION CONTROL Vitals:   09/29/22 0937 09/29/22 1007  BP: (!) 149/80 (!) 148/80    The patient's blood pressure is elevated above target today.  In order to address the patient's elevated BP: A new medication was prescribed today.            Physical Exam:    VS:  BP (!) 148/80   Pulse (!) 111   Ht '5\' 10"'$  (1.778 m)   SpO2 97%   BMI 59.92 kg/m     Wt Readings from Last 3 Encounters:  09/22/22 (!) 417 lb 9.6 oz (189.4 kg)  08/30/22 (!) 422 lb 9.6 oz (191.7 kg)  08/25/22 (!) 417 lb (189.1 kg)   Body mass index is 59.92 kg/m.   GEN: morbidly obese male.  in no acute distress HEENT: Normal NECK: No JVD; No carotid bruits LYMPHATICS: No lymphadenopathy CARDIAC: RRR, no murmurs, rubs, gallops RESPIRATORY:  Clear to auscultation without rales, wheezing or rhonchi  ABDOMEN: Soft, non-tender, non-distended MUSCULOSKELETAL:  No edema; No deformity  SKIN: Warm and dry NEUROLOGIC:  Alert and oriented x 3 PSYCHIATRIC:   Normal affect   ASSESSMENT:    1. Tachycardia   2. Medication management   3. Class 3 severe obesity due to excess calories with body mass index (BMI) of 60.0 to 69.9 in adult, unspecified whether serious comorbidity present ([place])   4. Primary hypertension    PLAN:      Chest pain: He has been to the ER for further evaluation of chest pain.  Troponin levels were negative.  EKG is unremarkable.  I agree with Dr. Deberah Pelton that his episodes of chest pain are likely due to his hypertension and tachycardia.  Will start him on metoprolol 25 mg twice a day.  Will start hydrochlorothiazide 25 mg a day and potassium chloride 10 mEq twice a day.  At some point I would like to do a coronary CT angiogram but right now his heart rate is way too fast.  Will need to work on gradually slowing his heart rate. I have instructed him to head back to the ER if he has other acute episodes of chest discomfort.  2.  Hypertension: His heart rate is very fast.  Will restart metoprolol 25 mg twice a day.  Will also start HCTZ 25 mg a day and potassium chloride 10 mill equivalents twice a day.  If he develops hypokalemia with this, we will consider changing him to spironolactone.  3.  Morbid obesity: His weight is 420 pounds.  Have strongly encouraged him to work on reducing his carbohydrate intake.  He is lost 11 pounds since January 1. Body mass index is 59.92 kg/m.           Medication Adjustments/Labs and Tests Ordered: Current medicines are reviewed at length with the patient today.  Concerns regarding medicines are outlined above.  Orders Placed This Encounter  Procedures   Basic metabolic panel   Meds ordered this encounter  Medications   metoprolol tartrate (LOPRESSOR) 25 MG tablet    Sig: Take 1 tablet (25 mg total) by mouth 2 (two) times daily.    Dispense:  180 tablet    Refill:  3   hydrochlorothiazide (HYDRODIURIL) 25 MG tablet    Sig: Take 1 tablet (25 mg total) by mouth daily.     Dispense:  90 tablet    Refill:  3   potassium chloride (KLOR-CON) 10 MEQ tablet    Sig: Take 1 tablet (10 mEq total) by mouth 2 (two) times daily.    Dispense:  180 tablet    Refill:  3    Patient Instructions  Medication Instructions:  START Metoprolol Tartrate '25mg'$  twice daily START Hydrochlorothiazide '25mg'$  daily START Potassium 60mq twice daily *If you need a refill on your cardiac medications before your next appointment, please call your pharmacy*   Lab Work: BMET in 3 weeks If you have labs (blood work) drawn today and your tests are completely normal, you will receive your results only by: [facility](if you have MyChart) OR A paper copy in the mail If you have any lab test that is abnormal or we need to change your treatment, we will call you to review the results.   Testing/Procedures: NONE   Follow-Up: [facility] you and your health needs are our priority.  As part of our continuing mission to provide you with exceptional heart care, we have created designated Provider Care Teams.  These Care Teams include your primary Cardiologist (physician) and Advanced Practice Providers (APPs -  Physician Assistants and Nurse Practitioners) who all work together to provide you with the care you need, when you need it.  We recommend signing up for the patient portal called "MyChart".  Sign up information is provided on this After Visit Summary.  MyChart is used to connect with patients for Virtual Visits (Telemedicine).  Patients are able to view lab/test results, encounter notes, upcoming appointments, etc.  Non-urgent messages can be sent to your provider as well.   To learn more about what you can do with MyChart, go to [email or web address]    Your next appointment:   3 month(s)  Provider:   PMertie Moores MD     Signed, PMertie Moores MD  09/29/2022 5:44 PM    [facility]

## 2022-09-29 ENCOUNTER — Encounter: Payer: Self-pay | Admitting: Cardiovascular Disease

## 2022-09-29 ENCOUNTER — Ambulatory Visit: Payer: BC Managed Care – PPO | Attending: Cardiovascular Disease | Admitting: Cardiovascular Disease

## 2022-09-29 VITALS — BP 148/80 | HR 111 | Ht 70.0 in

## 2022-09-29 DIAGNOSIS — R000 Tachycardia, unspecified: Secondary | ICD-10-CM

## 2022-09-29 DIAGNOSIS — Z79899 Other long term (current) drug therapy: Secondary | ICD-10-CM

## 2022-09-29 DIAGNOSIS — I10 Essential (primary) hypertension: Secondary | ICD-10-CM

## 2022-09-29 DIAGNOSIS — Z6841 Body Mass Index (BMI) 40.0 and over, adult: Secondary | ICD-10-CM

## 2022-09-29 MED ORDER — METOPROLOL TARTRATE 25 MG PO TABS: 25.0000 mg | ORAL_TABLET | Freq: Two times a day (BID) | ORAL | 3 refills | Status: DC

## 2022-09-29 MED ORDER — POTASSIUM CHLORIDE ER 10 MEQ PO TBCR: 10.0000 meq | EXTENDED_RELEASE_TABLET | Freq: Two times a day (BID) | ORAL | 3 refills | Status: DC

## 2022-09-29 MED ORDER — HYDROCHLOROTHIAZIDE 25 MG PO TABS: 25.0000 mg | ORAL_TABLET | Freq: Every day | ORAL | 3 refills | Status: DC

## 2022-09-29 NOTE — Patient Instructions (Signed)
Medication Instructions:  START Metoprolol Tartrate '25mg'$  twice daily START Hydrochlorothiazide '25mg'$  daily START Potassium 40mq twice daily *If you need a refill on your cardiac medications before your next appointment, please call your pharmacy*   Lab Work: BMET in 3 weeks If you have labs (blood work) drawn today and your tests are completely normal, you will receive your results only by: [facility](if you have MyChart) OR A paper copy in the mail If you have any lab test that is abnormal or we need to change your treatment, we will call you to review the results.   Testing/Procedures: NONE   Follow-Up: [facility] and your [facility] needs are our [facility].  As part of our continuing mission to provide you with exceptional heart care, we have created designated Provider Care Teams.  These Care Teams include your primary Cardiologist (physician) and Advanced Practice Providers (APPs -  Physician Assistants and Nurse Practitioners) who all work together to provide you with the care you need, when you need it.  We recommend signing up for the patient portal called "MyChart".  Sign up information is provided on this After Visit Summary.  MyChart is used to connect with patients for Virtual Visits (Telemedicine).  Patients are able to view lab/test results, encounter notes, upcoming appointments, etc.  Non-urgent messages can be sent to your provider as well.   To learn more about what you can do with MyChart, go to [email or web address]    Your next appointment:   3 month(s)  Provider:   PMertie Moores MD

## 2022-10-13 ENCOUNTER — Ambulatory Visit: Payer: BC Managed Care – PPO | Admitting: Family Medicine

## 2022-10-14 ENCOUNTER — Other Ambulatory Visit: Payer: Self-pay | Admitting: Family Medicine

## 2022-10-20 ENCOUNTER — Ambulatory Visit: Payer: BC Managed Care – PPO | Attending: Cardiovascular Disease

## 2022-10-20 DIAGNOSIS — R000 Tachycardia, unspecified: Secondary | ICD-10-CM

## 2022-10-20 DIAGNOSIS — I10 Essential (primary) hypertension: Secondary | ICD-10-CM

## 2022-10-20 DIAGNOSIS — Z79899 Other long term (current) drug therapy: Secondary | ICD-10-CM

## 2022-10-21 LAB — BASIC METABOLIC PANEL
BUN/Creatinine Ratio: 20 (ref 9–20)
BUN: 16 mg/dL (ref 6–24)
CO2: 29 mmol/L (ref 20–29)
Calcium: 9.2 mg/dL (ref 8.7–10.2)
Chloride: 98 mmol/L (ref 96–106)
Creatinine, Ser: 0.82 mg/dL (ref 0.76–1.27)
Glucose: 145 mg/dL — ABNORMAL HIGH (ref 70–99)
Potassium: 3.9 mmol/L (ref 3.5–5.2)
Sodium: 138 mmol/L (ref 134–144)
eGFR: 105 mL/min/{1.73_m2} (ref >59)

## 2022-10-23 ENCOUNTER — Telehealth: Payer: Self-pay | Admitting: Family Medicine

## 2022-10-25 NOTE — Telephone Encounter (Signed)
error 

## 2022-10-27 ENCOUNTER — Ambulatory Visit (INDEPENDENT_AMBULATORY_CARE_PROVIDER_SITE_OTHER): Payer: BC Managed Care – PPO | Admitting: Family Medicine

## 2022-10-27 ENCOUNTER — Encounter: Payer: Self-pay | Admitting: Family Medicine

## 2022-10-27 VITALS — BP 112/70 | HR 75 | Temp 98.4°F | Ht 70.0 in | Wt >= 6400 oz

## 2022-10-27 DIAGNOSIS — I10 Essential (primary) hypertension: Secondary | ICD-10-CM

## 2022-10-27 NOTE — Progress Notes (Signed)
OFFICE VISIT  10/27/2022  CC:  Chief Complaint  Patient presents with   Medical Management of Chronic Issues    hypertension    Patient is a 52 y.o. male who presents for 1 month follow-up hypertension. A/P as of last visit: "#1 uncontrolled hypertension. Intolerant of Toprol. Would like to add HCTZ but patient has history of sulfa allergy which caused swelling of the tongue. Will start hydralazine 25 mg 3 times daily. Continue valsartan 320 mg a day and amlodipine 10 mg a day.   #2 diabetes, diet controlled, no complications. Point-of-care hemoglobin A1c today is 6.2%. Will check urine microalbumin/creatinine at next follow-up visit. He will continue his current efforts at improving diet and exercise. No glycemic medication at this time."  INTERIM HX: He saw Dr. Acie Fredrickson in cardiology on 09/29/2022 and he was started on Lopressor 25 mg twice daily and HCTZ 25 mg a day to help with his hypertension as well as his tachycardia. Dr. Acie Fredrickson would like to get a coronary CT angiogram once he gets patient's heart rate down consistently.  Chrishaun says that he feels the best he has ever felt in his life.  Blood pressures consistently in the low Q000111Q systolic and in the Q000111Q diastolic.  His heart rate is morning more consistently in the 75-90 range. Current blood pressure medication regimen is amlodipine 10 mg a day, hydralazine 25 mg 3 times a day, HCTZ 25 mg a day, and Lopressor 25 mg twice a day. He had also been taking the valsartan and 320 mg with this regimen but noted that his blood pressure dropped a few times and made him quite weak and dizzy.  He then stopped this medication and has felt great since.  Energy level is up, he feels like walking for exercise more, no dizziness or shortness of breath.   Past Medical History:  Diagnosis Date   Adenoma of left adrenal gland    incidental on CT abd 10/2018.   Bell's palsy    L side.  Pain involved (?)--got better with gabapentin.    Diabetes mellitus without complication (HCC)    hx of , no meds   Erosive gastritis 10/2018   EGD   Esophageal stenosis 10/2018   dilation done   High risk sexual behavior    sex with men: started PrEP (descovy) 08/15/19.   Hyperlipidemia    75), HDL 27, TG 105. LDL goal= <110, no per pt   Hypertension    Low testosterone    2020/21   Morbid obesity (HCC)    BMI>50   NASH (nonalcoholic steatohepatitis)    Rapid weight loss 2020   Purposeful   Seasonal allergies    RAD with RTIs only    Past Surgical History:  Procedure Laterality Date   COLONOSCOPY W/ POLYPECTOMY  10/27/2019   Polyp x 1->submucosal lipoma.  Recall 10 yrs.   ESOPHAGOGASTRODUODENOSCOPY  10/29/2018   LASIK  2000   Bilaterally   TONSILLECTOMY     UPPER GASTROINTESTINAL ENDOSCOPY  2020    Outpatient Medications Prior to Visit  Medication Sig Dispense Refill   amLODipine (NORVASC) 10 MG tablet Take 1 tablet (10 mg total) by mouth daily. 90 tablet 1   clonazePAM (KLONOPIN) 0.5 MG tablet Take 1-2 tablets (0.5-1 mg total) by mouth 2 (two) times daily as needed for anxiety. 10 tablet 0   emtricitabine-tenofovir AF (DESCOVY) 200-25 MG tablet Take 1 tablet by mouth daily. 30 tablet 11   hydrALAZINE (APRESOLINE) 25 MG tablet Take  1 tablet (25 mg total) by mouth 3 (three) times daily. 90 tablet 1   hydrochlorothiazide (HYDRODIURIL) 25 MG tablet Take 1 tablet (25 mg total) by mouth daily. 90 tablet 3   metoprolol tartrate (LOPRESSOR) 25 MG tablet Take 1 tablet (25 mg total) by mouth 2 (two) times daily. 180 tablet 3   pantoprazole (PROTONIX) 40 MG tablet TAKE 1 TABLET BY MOUTH DAILY 180 tablet 1   valsartan (DIOVAN) 320 MG tablet Take 1 tablet (320 mg total) by mouth daily. 90 tablet 1   potassium chloride (KLOR-CON) 10 MEQ tablet Take 1 tablet (10 mEq total) by mouth 2 (two) times daily. (Patient not taking: Reported on 10/27/2022) 180 tablet 3   No facility-administered medications prior to visit.    Allergies   Allergen Reactions   Penicillins     Rash    Sulfonamide Derivatives     REACTION: rash Tongue swelling   Sulfa Antibiotics Swelling    Tongue swelling    Review of Systems As per HPI  PE:    10/27/2022    9:39 AM 09/29/2022   10:07 AM 09/29/2022    9:37 AM  Vitals with BMI  Height 5' 10"$   5' 10"$   Weight 421 lbs 3 oz    BMI [id number]    Systolic [id number]  Diastolic 70 80 80  Pulse 75  111   Physical Exam  Gen: Alert, well appearing.  Patient is oriented to person, place, time, and situation. AFFECT: pleasant, lucid thought and speech. CV: RRR, no m/r/g.   LUNGS: CTA bilat, nonlabored resps, good aeration in all lung fields. EXT: no clubbing or cyanosis.  No pitting edema.    LABS:  Last CBC Lab Results  Component Value Date   WBC 7.3 08/25/2022   HGB 12.7 (L) 08/25/2022   HCT 39.7 08/25/2022   MCV 85.0 08/25/2022   MCH 27.2 08/25/2022   RDW 14.2 08/25/2022   PLT 172 [id number]   Last metabolic panel Lab Results  Component Value Date   GLUCOSE 145 (H) 10/20/2022   NA 138 10/20/2022   K 3.9 10/20/2022   CL 98 10/20/2022   CO2 29 10/20/2022   BUN 16 10/20/2022   CREATININE 0.82 10/20/2022   EGFR 105 10/20/2022   CALCIUM 9.2 10/20/2022   PROT 7.0 03/16/2022   ALBUMIN 4.3 03/16/2022   BILITOT 0.7 03/16/2022   ALKPHOS 63 03/16/2022   AST 21 03/16/2022   ALT 17 03/16/2022   ANIONGAP 7 08/25/2022   Last lipids Lab Results  Component Value Date   CHOL 143 03/16/2022   HDL 34.50 (L) 03/16/2022   LDLCALC 82 03/16/2022   LDLDIRECT 92.0 03/30/2015   TRIG 134.0 03/16/2022   CHOLHDL 4 03/16/2022   Last hemoglobin A1c Lab Results  Component Value Date   HGBA1C 6.2 (A) 09/22/2022   HGBA1C 6.2 09/22/2022   HGBA1C 6.2 09/22/2022   HGBA1C 6.2 09/22/2022   Last thyroid functions Lab Results  Component Value Date   TSH 1.56 06/10/2021   T4TOTAL 6.7 06/10/2021   IMPRESSION AND PLAN:  #1 hypertension, well-controlled on amlodipine 10 mg daily,  hydralazine 25 mg 3 times daily, HCTZ 25 mg daily, and Lopressor 25 mg twice daily. Will take valsartan and off of his medication list. Recent electrolytes and kidney function normal at cardiologist on 10/20/2022.  #2 diabetes without complication, diet controlled. Hemoglobin A1c has ranged from 5.7% to 6.2% over the last 3 years.  Most recent was 6.2% about 1  month ago.  Okay to recheck this at next follow-up in 7 months.  An After Visit Summary was printed and given to the patient.  FOLLOW UP: Return in about 7 months (around 05/28/2023) for annual CPE (fasting).  Signed:  Crissie Sickles, MD           10/27/2022

## 2022-11-06 ENCOUNTER — Other Ambulatory Visit (HOSPITAL_COMMUNITY): Payer: Self-pay

## 2022-11-09 ENCOUNTER — Other Ambulatory Visit (HOSPITAL_COMMUNITY): Payer: Self-pay

## 2022-11-09 ENCOUNTER — Telehealth: Payer: Self-pay

## 2022-11-09 NOTE — Telephone Encounter (Signed)
LVM for pt to return call.  Note: if pt returns call, please ask if he is ok with change to Truvada, if not PA will have to be completed for Descovy.

## 2022-11-09 NOTE — Telephone Encounter (Signed)
Pt returned call and has already received medication in the mail. Additional information provided to pharmacy (see media section). Nothing further needed.

## 2022-11-09 NOTE — Telephone Encounter (Signed)
Received notification from [facility] that Dwayne Lewis requires PA. Preferred is Truvada. Truvada is covered free of charge to patient per test claim (subject to change depending on pt's preferred pharmacy). Please change if appropriate or advise to continue PA. Thank you

## 2022-12-01 ENCOUNTER — Encounter: Payer: Self-pay | Admitting: Family Medicine

## 2022-12-01 ENCOUNTER — Other Ambulatory Visit: Payer: Self-pay | Admitting: Family Medicine

## 2022-12-01 NOTE — Telephone Encounter (Signed)
Yes this is a good medicine but insurer may not cover it.  It depends on your formulary. Call the number on your insurance card and ask if they cover it, what tier it is, what is the typical co-pay, etc. --PM

## 2022-12-04 MED ORDER — TIRZEPATIDE 2.5 MG/0.5ML ~~LOC~~ SOAJ: 2.5000 mg | SUBCUTANEOUS | 0 refills | Status: DC

## 2022-12-04 NOTE — Telephone Encounter (Signed)
Dwayne Lewis prescription sent. If he starts the med he needs to f/u 1 mo.

## 2022-12-11 ENCOUNTER — Other Ambulatory Visit (HOSPITAL_COMMUNITY): Payer: Self-pay

## 2022-12-11 ENCOUNTER — Telehealth: Payer: Self-pay

## 2022-12-11 NOTE — Telephone Encounter (Signed)
Pharmacy Patient Advocate Encounter   Received notification that prior authorization for [facility] 2.5MG /0.5ML pen-injectors is required/requested.  Per Test Claim: PA required   PA submitted on 12/11/22 to (ins) [employer] via CoverMyMeds Key or (Medicaid) confirmation # [id number] Status is pending

## 2022-12-11 NOTE — Telephone Encounter (Signed)
No further action needed.

## 2022-12-19 NOTE — Telephone Encounter (Signed)
Please advise next steps

## 2022-12-19 NOTE — Telephone Encounter (Signed)
Pharmacy Patient Advocate Encounter  Received notification from [employer] Benefits that the request for prior authorization for Mounjaro 2.5MG /0.5ML pen-injectors has been denied due to step therapy - Called insurance [phone]) to see why it was denied, per [place], [place] denied because A1C must be greater than 6.5 .    Please be advised we currently do not have a Pharmacist to review denials, therefore you will need to process appeals accordingly as needed. Thanks for your support at this time.  Denial letter attached to charts

## 2022-12-19 NOTE — Telephone Encounter (Signed)
Please confirm if response received requesting additional info

## 2022-12-20 NOTE — Telephone Encounter (Signed)
LM for pt to return call to discuss.  

## 2022-12-20 NOTE — Telephone Encounter (Signed)
Telephone message sent to provider for review.

## 2022-12-20 NOTE — Telephone Encounter (Signed)
Pls notify pt that unfortunately his insurer is not going to pay for this type of medication.

## 2022-12-21 ENCOUNTER — Encounter: Payer: Self-pay | Admitting: Cardiovascular Disease

## 2022-12-21 NOTE — Progress Notes (Signed)
This encounter was created in error - please disregard.

## 2022-12-22 ENCOUNTER — Ambulatory Visit: Payer: BC Managed Care – PPO | Attending: Cardiovascular Disease | Admitting: Cardiovascular Disease

## 2022-12-26 ENCOUNTER — Other Ambulatory Visit: Payer: Self-pay | Admitting: Family Medicine

## 2023-01-17 ENCOUNTER — Telehealth: Payer: Self-pay

## 2023-01-17 ENCOUNTER — Ambulatory Visit: Payer: BC Managed Care – PPO | Admitting: Cardiovascular Disease

## 2023-01-17 NOTE — Telephone Encounter (Signed)
Nels Shaddix (Key: [id number]) - [id number] Greggory Keen 2.5MG /0.5ML pen-injectors Status: Sent To Plan Created: May 2nd, 2024 [phone] Sent: May 8th, 2024

## 2023-04-14 ENCOUNTER — Other Ambulatory Visit: Payer: Self-pay | Admitting: Family Medicine

## 2023-04-26 ENCOUNTER — Encounter (INDEPENDENT_AMBULATORY_CARE_PROVIDER_SITE_OTHER): Payer: Self-pay

## 2023-05-25 ENCOUNTER — Other Ambulatory Visit: Payer: Self-pay

## 2023-05-25 DIAGNOSIS — Z79899 Other long term (current) drug therapy: Secondary | ICD-10-CM

## 2023-05-25 MED ORDER — DESCOVY 200-25 MG PO TABS: 1.0000 | ORAL_TABLET | Freq: Every day | ORAL | 11 refills | Status: DC

## 2023-05-25 NOTE — Telephone Encounter (Signed)
RF request for emtricitabine-tenofovir AF (DESCOVY) 200-25 MG tablet  LOV: 10/27/22 Next ov: 05/31/23 Last written: 01/27/22 (30,11) prior rx by Janeece Agee, NP  Rx pending, Please fill, if appropriate.

## 2023-05-26 ENCOUNTER — Other Ambulatory Visit: Payer: Self-pay | Admitting: Family Medicine

## 2023-05-28 NOTE — Telephone Encounter (Signed)
Pt has upcoming appt 05/31/23  RF request for hydrALAZINE (APRESOLINE) 25 MG tablet   LOV: 10/27/22 Next ov: 05/31/23 Last written: 12/01/22 (90,5)

## 2023-05-31 ENCOUNTER — Encounter: Payer: Self-pay | Admitting: Family Medicine

## 2023-05-31 ENCOUNTER — Ambulatory Visit (INDEPENDENT_AMBULATORY_CARE_PROVIDER_SITE_OTHER): Payer: BC Managed Care – PPO | Admitting: Family Medicine

## 2023-05-31 VITALS — BP 128/86 | HR 90 | Temp 98.7°F | Ht 71.0 in | Wt >= 6400 oz

## 2023-05-31 DIAGNOSIS — E119 Type 2 diabetes mellitus without complications: Secondary | ICD-10-CM | POA: Diagnosis not present

## 2023-05-31 DIAGNOSIS — Z0000 Encounter for general adult medical examination without abnormal findings: Secondary | ICD-10-CM

## 2023-05-31 DIAGNOSIS — I10 Essential (primary) hypertension: Secondary | ICD-10-CM

## 2023-05-31 DIAGNOSIS — Z125 Encounter for screening for malignant neoplasm of prostate: Secondary | ICD-10-CM

## 2023-05-31 LAB — LIPID PANEL
Cholesterol: 141 mg/dL (ref 0–200)
HDL: 37.1 mg/dL — ABNORMAL LOW (ref >39.00)
LDL Cholesterol: 70 mg/dL (ref 0–99)
NonHDL: 104.12
Total CHOL/HDL Ratio: 4
Triglycerides: 170 mg/dL — ABNORMAL HIGH (ref 0.0–149.0)
VLDL: 34 mg/dL (ref 0.0–40.0)

## 2023-05-31 LAB — CBC WITH DIFFERENTIAL/PLATELET
Basophils Absolute: 0 10*3/uL (ref 0.0–0.1)
Basophils Relative: 0.5 % (ref 0.0–3.0)
Eosinophils Absolute: 0.2 10*3/uL (ref 0.0–0.7)
Eosinophils Relative: 2.1 % (ref 0.0–5.0)
HCT: 42.5 % (ref 39.0–52.0)
Hemoglobin: 13.5 g/dL (ref 13.0–17.0)
Lymphocytes Relative: 24.6 % (ref 12.0–46.0)
Lymphs Abs: 1.8 10*3/uL (ref 0.7–4.0)
MCHC: 31.8 g/dL (ref 30.0–36.0)
MCV: 83.4 fl (ref 78.0–100.0)
Monocytes Absolute: 0.6 10*3/uL (ref 0.1–1.0)
Monocytes Relative: 8.7 % (ref 3.0–12.0)
Neutro Abs: 4.6 10*3/uL (ref 1.4–7.7)
Neutrophils Relative %: 64.1 % (ref 43.0–77.0)
Platelets: 194 10*3/uL (ref 150.0–400.0)
RBC: 5.1 Mil/uL (ref 4.22–5.81)
RDW: 15.9 % — ABNORMAL HIGH (ref 11.5–15.5)
WBC: 7.2 10*3/uL (ref 4.0–10.5)

## 2023-05-31 LAB — POCT GLYCOSYLATED HEMOGLOBIN (HGB A1C)
HbA1c POC (<> result, manual entry): 7 % (ref 4.0–5.6)
HbA1c, POC (controlled diabetic range): 7 % (ref 0.0–7.0)
HbA1c, POC (prediabetic range): 7 % — AB (ref 5.7–6.4)
Hemoglobin A1C: 7 % — AB (ref 4.0–5.6)

## 2023-05-31 LAB — MICROALBUMIN / CREATININE URINE RATIO
Creatinine,U: 79.6 mg/dL
Microalb Creat Ratio: 1.1 mg/g (ref 0.0–30.0)
Microalb, Ur: 0.9 mg/dL (ref 0.0–1.9)

## 2023-05-31 LAB — COMPREHENSIVE METABOLIC PANEL
ALT: 29 U/L (ref 0–53)
AST: 29 U/L (ref 0–37)
Albumin: 4.1 g/dL (ref 3.5–5.2)
Alkaline Phosphatase: 69 U/L (ref 39–117)
BUN: 12 mg/dL (ref 6–23)
CO2: 32 mEq/L (ref 19–32)
Calcium: 9 mg/dL (ref 8.4–10.5)
Chloride: 99 mEq/L (ref 96–112)
Creatinine, Ser: 0.71 mg/dL (ref 0.40–1.50)
GFR: 103.97 mL/min (ref >60.00)
Glucose, Bld: 136 mg/dL — ABNORMAL HIGH (ref 70–99)
Potassium: 3.6 mEq/L (ref 3.5–5.1)
Sodium: 141 mEq/L (ref 135–145)
Total Bilirubin: 0.8 mg/dL (ref 0.2–1.2)
Total Protein: 7.1 g/dL (ref 6.0–8.3)

## 2023-05-31 LAB — TSH: TSH: 1.71 u[IU]/mL (ref 0.35–5.50)

## 2023-05-31 LAB — PSA: PSA: 0.75 ng/mL (ref 0.10–4.00)

## 2023-05-31 MED ORDER — PIOGLITAZONE HCL 15 MG PO TABS: 15.0000 mg | ORAL_TABLET | Freq: Every day | ORAL | 0 refills | Status: DC

## 2023-05-31 NOTE — Patient Instructions (Signed)
Health Maintenance, Male Adopting a healthy lifestyle and getting preventive care are important in promoting health and wellness. Ask your health care provider about: The right schedule for you to have regular tests and exams. Things you can do on your own to prevent diseases and keep yourself healthy. What should I know about diet, weight, and exercise? Eat a healthy diet  Eat a diet that includes plenty of vegetables, fruits, low-fat dairy products, and lean protein. Do not eat a lot of foods that are high in solid fats, added sugars, or sodium. Maintain a healthy weight Body mass index (BMI) is a measurement that can be used to identify possible weight problems. It estimates body fat based on height and weight. Your health care provider can help determine your BMI and help you achieve or maintain a healthy weight. Get regular exercise Get regular exercise. This is one of the most important things you can do for your health. Most adults should: Exercise for at least 150 minutes each week. The exercise should increase your heart rate and make you sweat (moderate-intensity exercise). Do strengthening exercises at least twice a week. This is in addition to the moderate-intensity exercise. Spend less time sitting. Even light physical activity can be beneficial. Watch cholesterol and blood lipids Have your blood tested for lipids and cholesterol at 54 years of age, then have this test every 5 years. You may need to have your cholesterol levels checked more often if: Your lipid or cholesterol levels are high. You are older than 54 years of age. You are at high risk for heart disease. What should I know about cancer screening? Many types of cancers can be detected early and may often be prevented. Depending on your health history and family history, you may need to have cancer screening at various ages. This may include screening for: Colorectal cancer. Prostate cancer. Skin cancer. Lung  cancer. What should I know about heart disease, diabetes, and high blood pressure? Blood pressure and heart disease High blood pressure causes heart disease and increases the risk of stroke. This is more likely to develop in people who have high blood pressure readings or are overweight. Talk with your health care provider about your target blood pressure readings. Have your blood pressure checked: Every 3-5 years if you are 18-39 years of age. Every year if you are 40 years old or older. If you are between the ages of 65 and 75 and are a current or former smoker, ask your health care provider if you should have a one-time screening for abdominal aortic aneurysm (AAA). Diabetes Have regular diabetes screenings. This checks your fasting blood sugar level. Have the screening done: Once every three years after age 45 if you are at a normal weight and have a low risk for diabetes. More often and at a younger age if you are overweight or have a high risk for diabetes. What should I know about preventing infection? Hepatitis B If you have a higher risk for hepatitis B, you should be screened for this virus. Talk with your health care provider to find out if you are at risk for hepatitis B infection. Hepatitis C Blood testing is recommended for: Everyone born from 1945 through 1965. Anyone with known risk factors for hepatitis C. Sexually transmitted infections (STIs) You should be screened each year for STIs, including gonorrhea and chlamydia, if: You are sexually active and are younger than 54 years of age. You are older than 54 years of age and your   health care provider tells you that you are at risk for this type of infection. Your sexual activity has changed since you were last screened, and you are at increased risk for chlamydia or gonorrhea. Ask your health care provider if you are at risk. Ask your health care provider about whether you are at high risk for HIV. Your health care provider  may recommend a prescription medicine to help prevent HIV infection. If you choose to take medicine to prevent HIV, you should first get tested for HIV. You should then be tested every 3 months for as long as you are taking the medicine. Follow these instructions at home: Alcohol use Do not drink alcohol if your health care provider tells you not to drink. If you drink alcohol: Limit how much you have to 0-2 drinks a day. Know how much alcohol is in your drink. In the [place] one drink equals one 12 oz bottle of beer (355 mL), one 5 oz glass of wine (148 mL), or one 1 oz glass of hard liquor (44 mL). Lifestyle Do not use any products that contain nicotine or tobacco. These products include cigarettes, chewing tobacco, and vaping devices, such as e-cigarettes. If you need help quitting, ask your health care provider. Do not use street drugs. Do not share needles. Ask your health care provider for help if you need support or information about quitting drugs. General instructions Schedule regular health, dental, and eye exams. Stay current with your vaccines. Tell your health care provider if: You often feel depressed. You have ever been abused or do not feel safe at home. Summary Adopting a healthy lifestyle and getting preventive care are important in promoting health and wellness. Follow your health care provider's instructions about healthy diet, exercising, and getting tested or screened for diseases. Follow your health care provider's instructions on monitoring your cholesterol and blood pressure. This information is not intended to replace advice given to you by your health care provider. Make sure you discuss any questions you have with your health care provider. Document Revised: 01/17/2021 Document Reviewed: 01/17/2021 Elsevier Patient Education  2024 Elsevier Inc.  

## 2023-05-31 NOTE — Progress Notes (Signed)
Office Note 05/31/2023  CC:  Chief Complaint  Patient presents with   Annual Exam    Pt is fasting   Patient is a 52 y.o. male who is here for annual health maintenance exam and 50mo follow-up hypertension and diabetes. A/P as of last visit: "#1 hypertension, well-controlled on amlodipine 10 mg daily, hydralazine 25 mg 3 times daily, HCTZ 25 mg daily, and Lopressor 25 mg twice daily. Will take valsartan and off of his medication list. Recent electrolytes and kidney function normal at cardiologist on 10/20/2022.   #2 diabetes without complication, diet controlled. Hemoglobin A1c has ranged from 5.7% to 6.2% over the last 3 years.  Most recent was 6.2% about 1 month ago.  Okay to recheck this at next follow-up in 7 months."  INTERIM HX: Doing ok.  Trying to diet/work on weight loss.  Lots of stress taking care of his ill mother. He mistakenly has not been taking hydralazine but has been compliant with all other meds.  No home glucose or blood pressure monitoring.    PMP AWARE reviewed today: most recent rx for clonazepam was filled 03/16/2022, # 10, rx by Janeece Agee, NP. No red flags.  Past Medical History:  Diagnosis Date   Adenoma of left adrenal gland    incidental on CT abd 10/2018.   Bell's palsy    L side.  Pain involved (?)--got better with gabapentin.   Diabetes mellitus without complication (HCC)    hx of , no meds   Erosive gastritis 10/2018   EGD   Esophageal stenosis 10/2018   dilation done   High risk sexual behavior    sex with men: started PrEP (descovy) 08/15/19.   Hyperlipidemia    75), HDL 27, TG 105. LDL goal= <110, no per pt   Hypertension    Low testosterone    2020/21   Morbid obesity (HCC)    BMI>50   NASH (nonalcoholic steatohepatitis)    Rapid weight loss 2020   Purposeful   Seasonal allergies    RAD with RTIs only    Past Surgical History:  Procedure Laterality Date   COLONOSCOPY W/ POLYPECTOMY  10/27/2019   Polyp x 1->submucosal  lipoma.  Recall 10 yrs.   ESOPHAGOGASTRODUODENOSCOPY  10/29/2018   LASIK  2000   Bilaterally   TONSILLECTOMY     UPPER GASTROINTESTINAL ENDOSCOPY  2020    Family History  Problem Relation Age of Onset   Hypertension Mother    Lung cancer Maternal Grandmother    COPD Maternal Grandmother        uncle    Heart failure Maternal Grandfather    Transient ischemic attack Maternal Grandfather    Hearing loss Maternal Grandfather    Stroke Maternal Grandfather    Hypertension Other    Diabetes Other        MG aunt   Colon cancer Neg Hx    Prostate cancer Neg Hx    CAD Neg Hx    Esophageal cancer Neg Hx    Stomach cancer Neg Hx    Rectal cancer Neg Hx    Pancreatic cancer Neg Hx     Social History   Socioeconomic History   Marital status: Single    Spouse name: Not on file   Number of children: 0   Years of education: Not on file   Highest education level: Some college, no degree  Occupational History   Occupation: works for a Technical sales engineer Part time   Tobacco Use  Smoking status: Never   Smokeless tobacco: Never  Vaping Use   Vaping status: Never Used  Substance and Sexual Activity   Alcohol use: Not Currently    Alcohol/week: 0.0 standard drinks of alcohol   Drug use: No   Sexual activity: Yes    Partners: Male  Other Topics Concern   Not on file  Social History Narrative   Single, no children.   Orig from [place].   Occup: works part time for [employer].   No tobacco.   No alcohol.      Lives w/ mother to help her out (mother is Lorelle Formosa)   Social Determinants of Health   Financial Resource Strain: Low Risk  (05/08/2022)   Overall Financial Resource Strain (CARDIA)    Difficulty of Paying Living Expenses: Not hard at all  Food Insecurity: No Food Insecurity (05/08/2022)   Hunger Vital Sign    Worried About Running Out of Food in the Last Year: Never true    Ran Out of Food in the Last Year: Never true  Transportation Needs: No  Transportation Needs (05/08/2022)   PRAPARE - Administrator, Civil Service (Medical): No    Lack of Transportation (Non-Medical): No  Physical Activity: Unknown (05/08/2022)   Exercise Vital Sign    Days of Exercise per Week: Patient declined    Minutes of Exercise per Session: Not on file  Stress: No Stress Concern Present (05/08/2022)   [employer] of Occupational Health - Occupational Stress Questionnaire    Feeling of Stress : Not at all  Social Connections: Unknown (05/08/2022)   Social Connection and Isolation Panel [NHANES]    Frequency of Communication with Friends and Family: Never    Frequency of Social Gatherings with Friends and Family: Once a week    Attends Religious Services: Patient declined    Database administrator or Organizations: No    Attends Engineer, structural: Not on file    Marital Status: Never married  Intimate Partner Violence: Unknown (12/15/2021)   Received from [facility]   HITS    Physically Hurt: Not on file    Insult or Talk Down To: Not on file    Threaten Physical Harm: Not on file    Scream or Curse: Not on file    Outpatient Medications Prior to Visit  Medication Sig Dispense Refill   amLODipine (NORVASC) 10 MG tablet TAKE 1 TABLET BY MOUTH EVERY DAY 60 tablet 0   clonazePAM (KLONOPIN) 0.5 MG tablet Take 1-2 tablets (0.5-1 mg total) by mouth 2 (two) times daily as needed for anxiety. 10 tablet 0   emtricitabine-tenofovir AF (DESCOVY) 200-25 MG tablet Take 1 tablet by mouth daily. 30 tablet 11   hydrochlorothiazide (HYDRODIURIL) 25 MG tablet Take 1 tablet (25 mg total) by mouth daily. 90 tablet 3   metoprolol tartrate (LOPRESSOR) 25 MG tablet Take 1 tablet (25 mg total) by mouth 2 (two) times daily. 180 tablet 3   pantoprazole (PROTONIX) 40 MG tablet TAKE 1 TABLET BY MOUTH DAILY 180 tablet 1   hydrALAZINE (APRESOLINE) 25 MG tablet TAKE 1 TABLET BY MOUTH THREE TIMES A DAY (Patient not taking: Reported on  05/31/2023) 90 tablet 5   potassium chloride (KLOR-CON) 10 MEQ tablet Take 1 tablet (10 mEq total) by mouth 2 (two) times daily. (Patient not taking: Reported on 10/27/2022) 180 tablet 3   tirzepatide (MOUNJARO) 2.5 MG/0.5ML Pen Inject 2.5 mg into the skin once  a week. (Patient not taking: Reported on 05/31/2023) 2 mL 0   No facility-administered medications prior to visit.    Allergies  Allergen Reactions   Penicillins     Rash    Sulfonamide Derivatives     REACTION: rash Tongue swelling   Sulfa Antibiotics Swelling    Tongue swelling    Review of Systems  Constitutional:  Negative for appetite change, chills, fatigue and fever.  HENT:  Negative for congestion, dental problem, ear pain and sore throat.   Eyes:  Negative for discharge, redness and visual disturbance.  Respiratory:  Negative for cough, chest tightness, shortness of breath and wheezing.   Cardiovascular:  Negative for chest pain, palpitations and leg swelling.  Gastrointestinal:  Negative for abdominal pain, blood in stool, diarrhea, nausea and vomiting.  Genitourinary:  Negative for difficulty urinating, dysuria, flank pain, frequency, hematuria and urgency.  Musculoskeletal:  Negative for arthralgias, back pain, joint swelling, myalgias and neck stiffness.  Skin:  Negative for pallor and rash.  Neurological:  Negative for dizziness, speech difficulty, weakness and headaches.  Hematological:  Negative for adenopathy. Does not bruise/bleed easily.  Psychiatric/Behavioral:  Negative for confusion and sleep disturbance. The patient is not nervous/anxious.     PE;    05/31/2023    8:11 AM 05/31/2023    7:59 AM 10/27/2022    9:39 AM  Vitals with BMI  Height  5\' 11"  5\' 10"   Weight  405 lbs 10 oz 421 lbs 3 oz  BMI  56.59 60.44  Systolic 128 144 161  Diastolic 86 84 70  Pulse  90 75    Gen: Alert, well appearing.  Patient is oriented to person, place, time, and situation. AFFECT: pleasant, lucid thought and  speech. ENT: Ears: EACs clear, normal epithelium.  TMs with good light reflex and landmarks bilaterally.  Eyes: no injection, icteris, swelling, or exudate.  EOMI, PERRLA. Nose: no drainage or turbinate edema/swelling.  No injection or focal lesion.  Mouth: lips without lesion/swelling.  Oral mucosa pink and moist.  Dentition intact and without obvious caries or gingival swelling.  Oropharynx without erythema, exudate, or swelling.  Neck: supple/nontender.  No LAD, mass, or TM.  Carotid pulses 2+ bilaterally, without bruits. CV: RRR, no m/r/g.   LUNGS: CTA bilat, nonlabored resps, good aeration in all lung fields. ABD: soft, NT, ND, BS normal.  No hepatospenomegaly or mass.  No bruits. EXT: no clubbing, cyanosis, or edema.  Musculoskeletal: no joint swelling, erythema, warmth, or tenderness.  ROM of all joints intact. Skin - no sores or suspicious lesions or rashes or color changes  Pertinent labs:  Lab Results  Component Value Date   TSH 1.56 06/10/2021   Lab Results  Component Value Date   WBC 7.3 08/25/2022   HGB 12.7 (L) 08/25/2022   HCT 39.7 08/25/2022   MCV 85.0 08/25/2022   PLT 172 08/25/2022   Lab Results  Component Value Date   CREATININE 0.82 10/20/2022   BUN 16 10/20/2022   NA 138 10/20/2022   K 3.9 10/20/2022   CL 98 10/20/2022   CO2 29 10/20/2022   Lab Results  Component Value Date   ALT 17 03/16/2022   AST 21 03/16/2022   ALKPHOS 63 03/16/2022   BILITOT 0.7 03/16/2022   Lab Results  Component Value Date   CHOL 143 03/16/2022   Lab Results  Component Value Date   HDL 34.50 (L) 03/16/2022   Lab Results  Component Value Date   LDLCALC 82  03/16/2022   Lab Results  Component Value Date   TRIG 134.0 03/16/2022   Lab Results  Component Value Date   CHOLHDL 4 03/16/2022   Lab Results  Component Value Date   PSA 1.23 10/13/2021   PSA 2.23 09/29/2019   Lab Results  Component Value Date   HGBA1C 7.0 (A) 05/31/2023   HGBA1C 7.0 05/31/2023    HGBA1C 7.0 (A) 05/31/2023   HGBA1C 7.0 05/31/2023   ASSESSMENT AND PLAN:   #1 health maintenance exam: Reviewed age and gender appropriate health maintenance issues (prudent diet, regular exercise, health risks of tobacco and excessive alcohol, use of seatbelts, fire alarms in home, use of sunscreen).  Also reviewed age and gender appropriate health screening as well as vaccine recommendations. Vaccines:   Flu->declined. Labs:HP, A1c, microalb/cr Prostate ca screening: PSA today Colon ca screening: recall 2031  #2 diabetes without complication, diet-only. Control a little bit worse lately .POC Hba1c today 7%, up from 6.2%. Reports history of hypoglycemia and GI side effects on metformin. Will start pioglitazone 15 mg daily. He will try to give a urine for microalbumin/creatinine today.  3.  Hypertension, historically well-controlled but he has inadvertently not been taking his hydralazine. He will restart hydralazine 25 mg 3 times daily and he will continue amlodipine 10 mg a day, HCTZ 25 mg a day, and Lopressor 25 mg twice a day. Electrolytes and creatinine monitoring today.  An After Visit Summary was printed and given to the patient.  FOLLOW UP:  Return in about 3 months (around 08/30/2023) for routine chronic illness f/u.  Signed:  Santiago Bumpers, MD           05/31/2023

## 2023-06-01 MED ORDER — TIRZEPATIDE 2.5 MG/0.5ML ~~LOC~~ SOAJ: 2.5000 mg | SUBCUTANEOUS | 0 refills | Status: DC

## 2023-06-01 NOTE — Telephone Encounter (Signed)
Hi Jaliel, I want to reassure you that the mild elevation of RDW is of no clinical significance.  Don't worry about this at all. Your triglycerides are mildly elevated and your HDL cholesterol is slightly low---->both of these can get better with weight loss. Hope this helps. Have a great weekend. --PM

## 2023-06-01 NOTE — Telephone Encounter (Signed)
I can certainly prescribe it and see if insurance will approve it now. Prescription for [facility] sent. If he does end up starting it then he will need to follow-up with me in 1 month.

## 2023-06-04 ENCOUNTER — Other Ambulatory Visit: Payer: Self-pay

## 2023-06-08 ENCOUNTER — Other Ambulatory Visit: Payer: Self-pay

## 2023-06-08 DIAGNOSIS — K219 Gastro-esophageal reflux disease without esophagitis: Secondary | ICD-10-CM

## 2023-06-08 MED ORDER — PANTOPRAZOLE SODIUM 40 MG PO TBEC: DELAYED_RELEASE_TABLET | ORAL | 1 refills | Status: DC

## 2023-06-08 NOTE — Telephone Encounter (Signed)
RF request for pantoprazole (PROTONIX) 40 MG tablet  LOV: 05/31/23 Next ov: 08/31/23 Last written: 03/16/22 (180,1)

## 2023-06-11 ENCOUNTER — Telehealth: Payer: Self-pay

## 2023-06-11 ENCOUNTER — Other Ambulatory Visit: Payer: Self-pay | Admitting: Family Medicine

## 2023-06-11 NOTE — Telephone Encounter (Signed)
Pharmacy Patient Advocate Encounter   Received notification from Patient Advice Request messages that prior authorization for Mounjaro 2.5MG /0.5ML pen-injectors is required/requested.   Insurance verification completed.   The patient is insured through  [employer]  .   Per test claim: PA required; PA submitted to RX [employer]ERRD via CoverMyMeds Key/confirmation #/EOC [id number] Status is pending

## 2023-06-11 NOTE — Telephone Encounter (Signed)
No further action needed.

## 2023-06-13 NOTE — Telephone Encounter (Signed)
Pharmacy Patient Advocate Encounter  Received notification from [employer] that Prior Authorization for Mounjaro 2.5MG /0.5ML pen-injectors has been APPROVED from 06-11-2023 to 06-10-2024   PA #/Case ID/Reference #: [id number]

## 2023-06-14 NOTE — Telephone Encounter (Signed)
No further action needed.

## 2023-06-15 ENCOUNTER — Other Ambulatory Visit: Payer: Self-pay

## 2023-06-15 DIAGNOSIS — Z79899 Other long term (current) drug therapy: Secondary | ICD-10-CM

## 2023-06-15 MED ORDER — DESCOVY 200-25 MG PO TABS: 1.0000 | ORAL_TABLET | Freq: Every day | ORAL | 11 refills | Status: DC

## 2023-07-17 ENCOUNTER — Ambulatory Visit: Payer: BC Managed Care – PPO | Admitting: Family Medicine

## 2023-07-17 VITALS — BP 120/75 | HR 89 | Temp 99.2°F | Ht 71.0 in | Wt >= 6400 oz

## 2023-07-17 DIAGNOSIS — I10 Essential (primary) hypertension: Secondary | ICD-10-CM | POA: Diagnosis not present

## 2023-07-17 DIAGNOSIS — F411 Generalized anxiety disorder: Secondary | ICD-10-CM

## 2023-07-17 DIAGNOSIS — F4322 Adjustment disorder with anxiety: Secondary | ICD-10-CM

## 2023-07-17 DIAGNOSIS — F40240 Claustrophobia: Secondary | ICD-10-CM

## 2023-07-17 MED ORDER — CLONAZEPAM 0.5 MG PO TABS: ORAL_TABLET | ORAL | 0 refills | Status: DC

## 2023-07-17 NOTE — Progress Notes (Signed)
OFFICE VISIT  07/17/2023  CC:  Chief Complaint  Patient presents with   Elevated Blood Pressure    Ongoing for 2 weeks    Patient is a 52 y.o. male who presents for elevated blood pressure  HPI: For the last 2 weeks he has noted blood pressures elevated into the 140s to 170s systolic over 70s to 100s. Feels headache when it goes up. No dizziness or visual abnormalities or chest pains. No palpitations.  He says he is under an extreme amount of stress every day.  He takes care of his mother with dementia/debilitation.  He also works full-time for a call center for [employer]. In the past clonazepam 0.5 mg has helped very well to calm his stress/anxiety and has not caused any side effects.   ROS as above, plus--> no fevers, no SOB, no wheezing, no cough, no rashes, no melena/hematochezia.  No polyuria or polydipsia.  No myalgias or arthralgias.  No focal weakness, paresthesias, or tremors.  No acute vision or hearing abnormalities.  No dysuria or unusual/new urinary urgency or frequency.  No recent changes in lower legs. No n/v/d or abd pain.     Past Medical History:  Diagnosis Date   Adenoma of left adrenal gland    incidental on CT abd 10/2018.   Bell's palsy    L side.  Pain involved (?)--got better with gabapentin.   Diabetes mellitus without complication (HCC)    hx of , no meds   Erosive gastritis 10/2018   EGD   Esophageal stenosis 10/2018   dilation done   High risk sexual behavior    sex with men: started PrEP (descovy) 08/15/19.   Hyperlipidemia    75), HDL 27, TG 105. LDL goal= <110, no per pt   Hypertension    Low testosterone    2020/21   Morbid obesity (HCC)    BMI>50   NASH (nonalcoholic steatohepatitis)    Rapid weight loss 2020   Purposeful   Seasonal allergies    RAD with RTIs only    Past Surgical History:  Procedure Laterality Date   COLONOSCOPY W/ POLYPECTOMY  10/27/2019   Polyp x 1->submucosal lipoma.  Recall 10 yrs.    ESOPHAGOGASTRODUODENOSCOPY  10/29/2018   LASIK  2000   Bilaterally   TONSILLECTOMY     UPPER GASTROINTESTINAL ENDOSCOPY  2020    Outpatient Medications Prior to Visit  Medication Sig Dispense Refill   amLODipine (NORVASC) 10 MG tablet TAKE 1 TABLET BY MOUTH EVERY DAY 90 tablet 1   emtricitabine-tenofovir AF (DESCOVY) 200-25 MG tablet Take 1 tablet by mouth daily. 30 tablet 11   hydrALAZINE (APRESOLINE) 25 MG tablet TAKE 1 TABLET BY MOUTH THREE TIMES A DAY 90 tablet 5   hydrochlorothiazide (HYDRODIURIL) 25 MG tablet Take 1 tablet (25 mg total) by mouth daily. 90 tablet 3   metoprolol tartrate (LOPRESSOR) 25 MG tablet Take 1 tablet (25 mg total) by mouth 2 (two) times daily. 180 tablet 3   pantoprazole (PROTONIX) 40 MG tablet TAKE 1 TABLET BY MOUTH DAILY 180 tablet 1   pioglitazone (ACTOS) 15 MG tablet Take 1 tablet (15 mg total) by mouth daily. 90 tablet 0   clonazePAM (KLONOPIN) 0.5 MG tablet Take 1-2 tablets (0.5-1 mg total) by mouth 2 (two) times daily as needed for anxiety. 10 tablet 0   tirzepatide (MOUNJARO) 2.5 MG/0.5ML Pen Inject 2.5 mg into the skin once a week. (Patient not taking: Reported on 07/17/2023) 2 mL 0   No  facility-administered medications prior to visit.    Allergies  Allergen Reactions   Penicillins     Rash    Sulfonamide Derivatives     REACTION: rash Tongue swelling   Sulfa Antibiotics Swelling    Tongue swelling   Metformin And Related Other (See Comments)    Hypoglyc+ GI s/e    Review of Systems  As per HPI  PE:    07/17/2023   10:35 AM 05/31/2023    8:11 AM 05/31/2023    7:59 AM  Vitals with BMI  Height 5\' 11"   5\' 11"   Weight 409 lbs 6 oz  405 lbs 10 oz  BMI 57.13  56.59  Systolic 120 128 086  Diastolic 75 86 84  Pulse 89  90     Physical Exam  Gen: Alert, well appearing.  Patient is oriented to person, place, time, and situation. AFFECT: pleasant, lucid thought and speech. CV: RRR, no m/r/g.   LUNGS: CTA bilat, nonlabored resps,  good aeration in all lung fields. EXT: no clubbing or cyanosis.  No pitting edema.    LABS:  Last CBC Lab Results  Component Value Date   WBC 7.2 05/31/2023   HGB 13.5 05/31/2023   HCT 42.5 05/31/2023   MCV 83.4 05/31/2023   MCH 27.2 08/25/2022   RDW 15.9 (H) 05/31/2023   PLT 194.0 05/31/2023   Last metabolic panel Lab Results  Component Value Date   GLUCOSE 136 (H) 05/31/2023   NA 141 05/31/2023   K 3.6 05/31/2023   CL 99 05/31/2023   CO2 32 05/31/2023   BUN 12 05/31/2023   CREATININE 0.71 05/31/2023   GFR 103.97 05/31/2023   CALCIUM 9.0 05/31/2023   PROT 7.1 05/31/2023   ALBUMIN 4.1 05/31/2023   BILITOT 0.8 05/31/2023   ALKPHOS 69 05/31/2023   AST 29 05/31/2023   ALT 29 05/31/2023   ANIONGAP 7 08/25/2022   Last hemoglobin A1c Lab Results  Component Value Date   HGBA1C 7.0 (A) 05/31/2023   HGBA1C 7.0 05/31/2023   HGBA1C 7.0 (A) 05/31/2023   HGBA1C 7.0 05/31/2023   IMPRESSION AND PLAN:  #1 hypertension, uncontrolled. I feel like emotional stress/anxiety is the issue here. His blood pressure today in the office was 120/75, heart rate 89. We will treat his anxiety with clonazepam and see how his blood pressure goes.  No blood pressure medication changes today. He will continue monitoring daily at home and call or return if still consistently over 130/80.  2.  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 He is overwhelmed. Has had good response to clonazepam at low-dose in the past. Will start clonazepam 0.5 mg daily, #90, no refill. Depending on how this goes we we will get a controlled substance contract drawn up when I see him next time.  An After Visit Summary was printed and given to the patient.  FOLLOW UP: Return for keep appt already set for 12/20.  Signed:  Santiago Bumpers, MD           07/17/2023

## 2023-07-19 ENCOUNTER — Emergency Department (HOSPITAL_BASED_OUTPATIENT_CLINIC_OR_DEPARTMENT_OTHER)
Admission: EM | Admit: 2023-07-19 | Discharge: 2023-07-19 | Discharge status: Against Medical Advice | Payer: BC Managed Care – PPO | Attending: Emergency Medicine | Admitting: Emergency Medicine

## 2023-07-19 ENCOUNTER — Encounter (HOSPITAL_BASED_OUTPATIENT_CLINIC_OR_DEPARTMENT_OTHER): Payer: Self-pay | Admitting: Emergency Medicine

## 2023-07-19 ENCOUNTER — Other Ambulatory Visit: Payer: Self-pay

## 2023-07-19 DIAGNOSIS — I10 Essential (primary) hypertension: Secondary | ICD-10-CM | POA: Insufficient documentation

## 2023-07-19 DIAGNOSIS — Z5321 Procedure and treatment not carried out due to patient leaving prior to being seen by health care provider: Secondary | ICD-10-CM | POA: Diagnosis not present

## 2023-07-19 DIAGNOSIS — R0602 Shortness of breath: Secondary | ICD-10-CM | POA: Diagnosis present

## 2023-07-19 NOTE — ED Triage Notes (Signed)
Pt POV c/o high BP at home, shortness of breath today. Reports episode of R hand numbness, lasting appx 15-20 mins, spontaneously resolved. Pt speaking in full sentences, 97% RA.   VAN neg.

## 2023-08-01 ENCOUNTER — Encounter: Payer: Self-pay | Admitting: Family Medicine

## 2023-08-01 NOTE — Telephone Encounter (Signed)
It's for mounjaro. Look at 05/31/23 encounter "patient message". Pls check the status of the PA. I did the rx on 06/01/23.

## 2023-08-03 ENCOUNTER — Other Ambulatory Visit (HOSPITAL_COMMUNITY): Payer: Self-pay

## 2023-08-15 ENCOUNTER — Other Ambulatory Visit (HOSPITAL_COMMUNITY): Payer: Self-pay

## 2023-08-15 NOTE — Telephone Encounter (Signed)
Noted  

## 2023-08-15 NOTE — Telephone Encounter (Signed)
Insurance called [facility] and provided required information for claim to go through. Claim processed for $0.00 and is being filled.

## 2023-08-15 NOTE — Telephone Encounter (Signed)
Representative from patients plan is sending a message for this issue to be further investigated by them. Having issues with processing claim at [facility] and [facility].

## 2023-08-15 NOTE — Telephone Encounter (Signed)
Called patients plan- sending to "investigation team" as we have an approval on file with a copy of the approval letter in the patients profile-should receive a call back by end of day tomorrow 12/05.

## 2023-08-16 NOTE — Telephone Encounter (Signed)
Noted  

## 2023-08-27 ENCOUNTER — Other Ambulatory Visit: Payer: Self-pay | Admitting: Family Medicine

## 2023-08-31 ENCOUNTER — Ambulatory Visit: Payer: BC Managed Care – PPO | Admitting: Family Medicine

## 2023-08-31 ENCOUNTER — Encounter: Payer: Self-pay | Admitting: Family Medicine

## 2023-08-31 VITALS — BP 122/72 | HR 89 | Wt >= 6400 oz

## 2023-08-31 DIAGNOSIS — F411 Generalized anxiety disorder: Secondary | ICD-10-CM

## 2023-08-31 DIAGNOSIS — F99 Mental disorder, not otherwise specified: Secondary | ICD-10-CM

## 2023-08-31 DIAGNOSIS — E119 Type 2 diabetes mellitus without complications: Secondary | ICD-10-CM | POA: Diagnosis not present

## 2023-08-31 DIAGNOSIS — I10 Essential (primary) hypertension: Secondary | ICD-10-CM | POA: Diagnosis not present

## 2023-08-31 DIAGNOSIS — Z7985 Long-term (current) use of injectable non-insulin antidiabetic drugs: Secondary | ICD-10-CM | POA: Diagnosis not present

## 2023-08-31 DIAGNOSIS — F5105 Insomnia due to other mental disorder: Secondary | ICD-10-CM

## 2023-08-31 DIAGNOSIS — Z79899 Other long term (current) drug therapy: Secondary | ICD-10-CM

## 2023-08-31 LAB — POCT GLYCOSYLATED HEMOGLOBIN (HGB A1C)
HbA1c POC (<> result, manual entry): 5.7 % (ref 4.0–5.6)
HbA1c, POC (controlled diabetic range): 5.7 % (ref 0.0–7.0)
HbA1c, POC (prediabetic range): 5.7 % (ref 5.7–6.4)
Hemoglobin A1C: 5.7 % — AB (ref 4.0–5.6)

## 2023-08-31 NOTE — Progress Notes (Signed)
OFFICE VISIT  08/31/2023  CC:  Chief Complaint  Patient presents with   Diabetes    Pt is not fasting.     Patient is a 52 y.o. male who presents for follow-up hypertension, diabetes, and GAD.  INTERIM HX: Doing well other than recent sinus infection, was seen at urgent care 2 days ago and placed on azithromycin and a cough medication.  Is feeling better. Marland Kitchen Has had 2 injections of Mounjaro and no side effects. Also taking pioglitazone 15 mg a day.  No home glucose monitoring.  No home blood pressure monitoring.  Clonazepam has helped with anxiety on an as-needed basis.  Things have been improved in the last couple weeks and he has not taken it very much at all. PMP AWARE reviewed today: most recent rx for clonazepam was filled 07/17/2023, # 90, rx by me. No red flags.  ROS as above, plus--> no fevers, no CP, no SOB, no wheezing, o dizziness, no HAs, no rashes, no melena/hematochezia.  No polyuria or polydipsia.  No myalgias or arthralgias.  No focal weakness, paresthesias, or tremors.  No acute vision or hearing abnormalities.  No dysuria or unusual/new urinary urgency or frequency.  No recent changes in lower legs. No n/v/d or abd pain.  No palpitations.    Past Medical History:  Diagnosis Date   Adenoma of left adrenal gland    incidental on CT abd 10/2018.   Bell's palsy    L side.  Pain involved (?)--got better with gabapentin.   Diabetes mellitus without complication (HCC)    hx of , no meds   Erosive gastritis 10/2018   EGD   Esophageal stenosis 10/2018   dilation done   High risk sexual behavior    sex with men: started PrEP (descovy) 08/15/19.   Hyperlipidemia    75), HDL 27, TG 105. LDL goal= <110, no per pt   Hypertension    Low testosterone    2020/21   Morbid obesity (HCC)    BMI>50   NASH (nonalcoholic steatohepatitis)    Rapid weight loss 2020   Purposeful   Seasonal allergies    RAD with RTIs only    Past Surgical History:  Procedure Laterality Date    COLONOSCOPY W/ POLYPECTOMY  10/27/2019   Polyp x 1->submucosal lipoma.  Recall 10 yrs.   ESOPHAGOGASTRODUODENOSCOPY  10/29/2018   LASIK  2000   Bilaterally   TONSILLECTOMY     UPPER GASTROINTESTINAL ENDOSCOPY  2020    Outpatient Medications Prior to Visit  Medication Sig Dispense Refill   amLODipine (NORVASC) 10 MG tablet TAKE 1 TABLET BY MOUTH EVERY DAY 90 tablet 1   clonazePAM (KLONOPIN) 0.5 MG tablet 1 tab po qd 90 tablet 0   emtricitabine-tenofovir AF (DESCOVY) 200-25 MG tablet Take 1 tablet by mouth daily. 30 tablet 11   hydrALAZINE (APRESOLINE) 25 MG tablet TAKE 1 TABLET BY MOUTH THREE TIMES A DAY 90 tablet 5   hydrochlorothiazide (HYDRODIURIL) 25 MG tablet Take 1 tablet (25 mg total) by mouth daily. 90 tablet 3   metoprolol tartrate (LOPRESSOR) 25 MG tablet Take 1 tablet (25 mg total) by mouth 2 (two) times daily. 180 tablet 3   pantoprazole (PROTONIX) 40 MG tablet TAKE 1 TABLET BY MOUTH DAILY 180 tablet 1   pioglitazone (ACTOS) 15 MG tablet Take 1 tablet (15 mg total) by mouth daily. 90 tablet 0   tirzepatide (MOUNJARO) 2.5 MG/0.5ML Pen Inject 2.5 mg into the skin once a week. 2 mL 0  No facility-administered medications prior to visit.    Allergies  Allergen Reactions   Penicillins     Rash    Sulfonamide Derivatives     REACTION: rash Tongue swelling   Sulfa Antibiotics Swelling    Tongue swelling   Metformin And Related Other (See Comments)    Hypoglyc+ GI s/e    Review of Systems As per HPI  PE:    08/31/2023   10:12 AM 07/19/2023    8:14 PM 07/17/2023   10:35 AM  Vitals with BMI  Height  5\' 11"  5\' 11"   Weight 401 lbs 403 lbs 409 lbs 6 oz  BMI  56.23 57.13  Systolic 122 160 253  Diastolic 72 80 75  Pulse 89 110 89     Physical Exam  Gen: Alert, well appearing.  Patient is oriented to person, place, time, and situation. AFFECT: pleasant, lucid thought and speech. No further exam today.  LABS:  Lab Results  Component Value Date   TSH 1.71  05/31/2023   Lab Results  Component Value Date   WBC 7.2 05/31/2023   HGB 13.5 05/31/2023   HCT 42.5 05/31/2023   MCV 83.4 05/31/2023   PLT 194.0 05/31/2023   Lab Results  Component Value Date   CREATININE 0.71 05/31/2023   BUN 12 05/31/2023   NA 141 05/31/2023   K 3.6 05/31/2023   CL 99 05/31/2023   CO2 32 05/31/2023   Lab Results  Component Value Date   ALT 29 05/31/2023   AST 29 05/31/2023   ALKPHOS 69 05/31/2023   BILITOT 0.8 05/31/2023   Lab Results  Component Value Date   CHOL 141 05/31/2023   Lab Results  Component Value Date   HDL 37.10 (L) 05/31/2023   Lab Results  Component Value Date   LDLCALC 70 05/31/2023   Lab Results  Component Value Date   TRIG 170.0 (H) 05/31/2023   Lab Results  Component Value Date   CHOLHDL 4 05/31/2023   Lab Results  Component Value Date   PSA 0.75 05/31/2023   PSA 1.23 10/13/2021   PSA 2.23 09/29/2019   Lab Results  Component Value Date   HGBA1C 7.0 (A) 05/31/2023   HGBA1C 7.0 05/31/2023   HGBA1C 7.0 (A) 05/31/2023   HGBA1C 7.0 05/31/2023    IMPRESSION AND PLAN:  #1 diabetes without complication. Great control on pioglitazone 15 mg a day, plus recent addition of Mounjaro 2.5 mg weekly. POC Hba1c today is 5.7%. No changes today.  2.  Hypertension, well-controlled on amlodipine 10 mg a day, hydralazine 25 mg 3 times a day, Lopressor 25 mg twice a day, and HCTZ 25 mg a day.  #3 GAD, with superimposed adjustment disorder with anxious mood. He has had good results with clonazepam 0.5 mg daily as needed.  He is using this responsibly/appropriately. Will get controlled substance contract on the books at next follow-up, urine drug screen at that time as well. A new prescription for this medication was not needed today.  An After Visit Summary was printed and given to the patient.  FOLLOW UP: Return in about 3 months (around 11/29/2023) for routine chronic illness f/u.  Next CPE September 2025  Signed:  Santiago Bumpers, MD           08/31/2023

## 2023-09-06 MED ORDER — TIRZEPATIDE 5 MG/0.5ML ~~LOC~~ SOAJ: 5.0000 mg | SUBCUTANEOUS | 0 refills | Status: DC

## 2023-09-06 NOTE — Telephone Encounter (Signed)
Okay, 5 mg Mounjaro dose sent

## 2023-09-20 NOTE — Progress Notes (Signed)
 OFFICE VISIT  09/21/2023  CC:  Chief Complaint  Patient presents with   Covid Positive    Pt went to UC on Sunday, tested + for Covid & Strep. Pt took at home Covid test Wed and was neg. Cough, chest congestion, sore throat, drainage. Was prescribed prednisone , cough medicine. Finished with both.     Patient is a 54 y.o. male who presents for sinus pressure and cough. HPI: Ongoing nasal congestion, postnasal drip, sinus pressure, mild sore throat, and cough for the last few weeks.  He had worsened significantly about a week ago so he went to [facility] in [place] 5 d/a and patient states he was told he was positive for both strep and COVID.  We have no records at this time. He states he was prescribed a medication for 5 days but does not recall the name of it.  He says it made him feel impaired/weird and he stopped it after 2 days.  He was also prescribed a cough syrup and a 5-day prednisone  taper.  No antibiotic per his recollection. The prednisone  caused significant insomnia. He does feel a little bit better.  He feels like his chest is a little bit congested but he cannot get anything up.  Feels some wheezing when he lays down to go to bed at night.  He has not had fever in 2 days. Minimal sore throat. He did a home COVID test 2 days ago and it was negative.  No nausea, vomiting, diarrhea, headache, or facial pain.  Past Medical History:  Diagnosis Date   Adenoma of left adrenal gland    incidental on CT abd 10/2018.   Bell's palsy    L side.  Pain involved (?)--got better with gabapentin .   Diabetes mellitus without complication (HCC)    hx of , no meds   Erosive gastritis 10/2018   EGD   Esophageal stenosis 10/2018   dilation done   High risk sexual behavior    sex with men: started PrEP (descovy ) 08/15/19.   Hyperlipidemia    75), HDL 27, TG 105. LDL goal= <110, no per pt   Hypertension    Low testosterone     2020/21   Morbid obesity (HCC)    BMI>50    NASH (nonalcoholic steatohepatitis)    Rapid weight loss 2020   Purposeful   Seasonal allergies    RAD with RTIs only    Past Surgical History:  Procedure Laterality Date   COLONOSCOPY W/ POLYPECTOMY  10/27/2019   Polyp x 1->submucosal lipoma.  Recall 10 yrs.   ESOPHAGOGASTRODUODENOSCOPY  10/29/2018   LASIK  2000   Bilaterally   TONSILLECTOMY     UPPER GASTROINTESTINAL ENDOSCOPY  2020    Outpatient Medications Prior to Visit  Medication Sig Dispense Refill   amLODipine  (NORVASC ) 10 MG tablet TAKE 1 TABLET BY MOUTH EVERY DAY 90 tablet 1   clonazePAM  (KLONOPIN ) 0.5 MG tablet 1 tab po qd 90 tablet 0   emtricitabine-tenofovir AF (DESCOVY ) 200-25 MG tablet Take 1 tablet by mouth daily. 30 tablet 11   hydrALAZINE  (APRESOLINE ) 25 MG tablet TAKE 1 TABLET BY MOUTH THREE TIMES A DAY 90 tablet 5   hydrochlorothiazide  (HYDRODIURIL ) 25 MG tablet Take 1 tablet (25 mg total) by mouth daily. 90 tablet 3   metoprolol  tartrate (LOPRESSOR ) 25 MG tablet Take 1 tablet (25 mg total) by mouth 2 (two) times daily. 180 tablet 3   pantoprazole  (PROTONIX ) 40 MG tablet TAKE 1 TABLET BY MOUTH  DAILY 180 tablet 1   pioglitazone  (ACTOS ) 15 MG tablet Take 1 tablet (15 mg total) by mouth daily. 90 tablet 0   tirzepatide  (MOUNJARO ) 5 MG/0.5ML Pen Inject 5 mg into the skin once a week. 2 mL 0   No facility-administered medications prior to visit.    Allergies  Allergen Reactions   Penicillins     Rash    Sulfonamide Derivatives     REACTION: rash Tongue swelling   Sulfa Antibiotics Swelling    Tongue swelling   Metformin  And Related Other (See Comments)    Hypoglyc+ GI s/e    Review of Systems  As per HPI  PE:    09/21/2023   10:51 AM 08/31/2023   10:12 AM 07/19/2023    8:14 PM  Vitals with BMI  Height   5' 11  Weight 391 lbs 3 oz 401 lbs 403 lbs  BMI   56.23  Systolic 125 122 839  Diastolic 85 72 80  Pulse 92 89 110  02 sat 96% RA  Physical Exam  VS: noted--normal. Gen: alert, NAD,  NONTOXIC APPEARING. HEENT: eyes without injection, drainage, or swelling.  Ears: EACs clear, TMs with normal light reflex and landmarks.  Nose: Clear rhinorrhea, with some dried, crusty exudate adherent to mildly injected mucosa.  No purulent d/c.  No paranasal sinus TTP.  No facial swelling.  Throat and mouth without focal lesion.  No pharyngial swelling, erythema, or exudate.   Neck: supple, no LAD.   LUNGS: CTA bilat, nonlabored resps.   CV: RRR, no m/r/g. EXT: no c/c/e SKIN: no rash   LABS:  Last metabolic panel Lab Results  Component Value Date   GLUCOSE 136 (H) 05/31/2023   NA 141 05/31/2023   K 3.6 05/31/2023   CL 99 05/31/2023   CO2 32 05/31/2023   BUN 12 05/31/2023   CREATININE 0.71 05/31/2023   GFR 103.97 05/31/2023   CALCIUM 9.0 05/31/2023   PROT 7.1 05/31/2023   ALBUMIN 4.1 05/31/2023   BILITOT 0.8 05/31/2023   ALKPHOS 69 05/31/2023   AST 29 05/31/2023   ALT 29 05/31/2023   ANIONGAP 7 08/25/2022   IMPRESSION AND PLAN:  Prolonged respiratory infection, both upper and lower respiratory symptoms.   He is improving some status post prednisone  taper.  We called the pharmacy and they state that he filled a Z-Pak, albuterol  inhaler, and a prednisone  pack 5 days ago.  No cough syrup or antiviral.  I encouraged him to take 2 puffs of his inhaler about 30 minutes before bedtime. Will treat with doxycycline  100 mg twice daily x 10 days. No more steroids-> these caused him significant insomnia and increased appetite. I have very low suspicion that he has/had strep throat.  Will get records from [facility].  An After Visit Summary was printed and given to the patient.  FOLLOW UP: Return if symptoms worsen or fail to improve.  Signed:  Gerlene Hockey, MD           09/21/2023

## 2023-09-21 ENCOUNTER — Ambulatory Visit (INDEPENDENT_AMBULATORY_CARE_PROVIDER_SITE_OTHER): Payer: BC Managed Care – PPO | Admitting: Family Medicine

## 2023-09-21 ENCOUNTER — Encounter: Payer: Self-pay | Admitting: Family Medicine

## 2023-09-21 VITALS — BP 125/85 | HR 92 | Temp 98.9°F | Wt 391.2 lb

## 2023-09-21 DIAGNOSIS — J209 Acute bronchitis, unspecified: Secondary | ICD-10-CM

## 2023-09-21 DIAGNOSIS — J069 Acute upper respiratory infection, unspecified: Secondary | ICD-10-CM

## 2023-09-21 MED ORDER — DOXYCYCLINE HYCLATE 100 MG PO CAPS: 100.0000 mg | ORAL_CAPSULE | Freq: Two times a day (BID) | ORAL | 0 refills | Status: AC

## 2023-10-11 ENCOUNTER — Encounter: Payer: Self-pay | Admitting: Family Medicine

## 2023-10-11 ENCOUNTER — Ambulatory Visit (INDEPENDENT_AMBULATORY_CARE_PROVIDER_SITE_OTHER): Payer: BC Managed Care – PPO | Admitting: Family Medicine

## 2023-10-11 VITALS — BP 138/83 | HR 95 | Temp 102.0°F | Wt 395.0 lb

## 2023-10-11 DIAGNOSIS — J111 Influenza due to unidentified influenza virus with other respiratory manifestations: Secondary | ICD-10-CM | POA: Diagnosis not present

## 2023-10-11 LAB — POCT INFLUENZA A/B
Influenza A, POC: NEGATIVE
Influenza B, POC: NEGATIVE

## 2023-10-11 LAB — POC COVID19 BINAXNOW: SARS Coronavirus 2 Ag: NEGATIVE

## 2023-10-11 MED ORDER — HYDROCODONE BIT-HOMATROP MBR 5-1.5 MG/5ML PO SOLN: ORAL | 0 refills | Status: DC

## 2023-10-11 MED ORDER — OSELTAMIVIR PHOSPHATE 75 MG PO CAPS: 75.0000 mg | ORAL_CAPSULE | Freq: Two times a day (BID) | ORAL | 0 refills | Status: AC

## 2023-10-11 NOTE — Progress Notes (Signed)
OFFICE VISIT  10/11/2023  CC:  Chief Complaint  Patient presents with   Cough    A few days; cough, fatigue, fever, congestion. Has taken tylenol.     Patient is a 53 y.o. male who presents for cough.  HPI: Onset approximately 36 hours ago, cough and fever.  Mild sore throat.  Significant body aches and fatigue, particularly when his fever hits. No significant nasal congestion or runny nose.  No shortness of breath or chest pain. No nausea, vomiting, or diarrhea. No known contacts with anyone with similar symptoms.   Past Medical History:  Diagnosis Date   Adenoma of left adrenal gland    incidental on CT abd 10/2018.   Bell's palsy    L side.  Pain involved (?)--got better with gabapentin.   Diabetes mellitus without complication (HCC)    hx of , no meds   Erosive gastritis 10/2018   EGD   Esophageal stenosis 10/2018   dilation done   High risk sexual behavior    sex with men: started PrEP (descovy) 08/15/19.   Hyperlipidemia    75), HDL 27, TG 105. LDL goal= <110, no per pt   Hypertension    Low testosterone    2020/21   Morbid obesity (HCC)    BMI>50   NASH (nonalcoholic steatohepatitis)    Rapid weight loss 2020   Purposeful   Seasonal allergies    RAD with RTIs only    Past Surgical History:  Procedure Laterality Date   COLONOSCOPY W/ POLYPECTOMY  10/27/2019   Polyp x 1->submucosal lipoma.  Recall 10 yrs.   ESOPHAGOGASTRODUODENOSCOPY  10/29/2018   LASIK  2000   Bilaterally   TONSILLECTOMY     UPPER GASTROINTESTINAL ENDOSCOPY  2020    Outpatient Medications Prior to Visit  Medication Sig Dispense Refill   amLODipine (NORVASC) 10 MG tablet TAKE 1 TABLET BY MOUTH EVERY DAY 90 tablet 1   clonazePAM (KLONOPIN) 0.5 MG tablet 1 tab po qd 90 tablet 0   emtricitabine-tenofovir AF (DESCOVY) 200-25 MG tablet Take 1 tablet by mouth daily. 30 tablet 11   hydrALAZINE (APRESOLINE) 25 MG tablet TAKE 1 TABLET BY MOUTH THREE TIMES A DAY 90 tablet 5    hydrochlorothiazide (HYDRODIURIL) 25 MG tablet Take 1 tablet (25 mg total) by mouth daily. 90 tablet 3   metoprolol tartrate (LOPRESSOR) 25 MG tablet Take 1 tablet (25 mg total) by mouth 2 (two) times daily. 180 tablet 3   pantoprazole (PROTONIX) 40 MG tablet TAKE 1 TABLET BY MOUTH DAILY 180 tablet 1   pioglitazone (ACTOS) 15 MG tablet Take 1 tablet (15 mg total) by mouth daily. 90 tablet 0   tirzepatide (MOUNJARO) 5 MG/0.5ML Pen Inject 5 mg into the skin once a week. 2 mL 0   No facility-administered medications prior to visit.    Allergies  Allergen Reactions   Penicillins     Rash    Sulfonamide Derivatives     REACTION: rash Tongue swelling   Sulfa Antibiotics Swelling    Tongue swelling   Metformin And Related Other (See Comments)    Hypoglyc+ GI s/e    Review of Systems  As per HPI  PE:    10/11/2023    4:00 PM 09/21/2023   10:51 AM 08/31/2023   10:12 AM  Vitals with BMI  Weight 395 lbs 391 lbs 3 oz 401 lbs  Systolic 138 125 161  Diastolic 83 85 72  Pulse 95 92 89  Physical Exam  Gen: Alert, well appearing.  Patient is oriented to person, place, time, and situation. AFFECT: pleasant, lucid thought and speech. AOZ:HYQM: no injection, icteris, swelling, or exudate.  EOMI, PERRLA. Mouth: lips without lesion/swelling.  Oral mucosa pink and moist. Oropharynx without erythema, exudate, or swelling.  CV: RRR, no m/r/g.   LUNGS: CTA bilat, nonlabored resps, good aeration in all lung fields.   LABS:  Last metabolic panel Lab Results  Component Value Date   GLUCOSE 136 (H) 05/31/2023   NA 141 05/31/2023   K 3.6 05/31/2023   CL 99 05/31/2023   CO2 32 05/31/2023   BUN 12 05/31/2023   CREATININE 0.71 05/31/2023   GFR 103.97 05/31/2023   CALCIUM 9.0 05/31/2023   PROT 7.1 05/31/2023   ALBUMIN 4.1 05/31/2023   BILITOT 0.8 05/31/2023   ALKPHOS 69 05/31/2023   AST 29 05/31/2023   ALT 29 05/31/2023   ANIONGAP 7 08/25/2022    IMPRESSION AND PLAN:  #1  influenza-like illness. Rapid flu and rapid COVID-negative here today. Treat empirically with Tamiflu 75 mg twice daily x 5 days. Hycodan syrup, 1 to 2 teaspoons 3 times a day as needed for cough.  120 mL.  An After Visit Summary was printed and given to the patient.  FOLLOW UP: No follow-ups on file.  Signed:  Santiago Bumpers, MD           10/11/2023

## 2023-10-12 NOTE — Telephone Encounter (Signed)
Okay.  Letter sent. Let me know if the information on it is not correct. Have a good weekend.

## 2023-10-16 ENCOUNTER — Other Ambulatory Visit (HOSPITAL_COMMUNITY): Payer: Self-pay

## 2023-10-16 ENCOUNTER — Telehealth: Payer: Self-pay

## 2023-10-16 NOTE — Telephone Encounter (Signed)
PA request has been Submitted. New Encounter created for follow up. For additional info see Pharmacy Prior Auth telephone encounter from 02/04.

## 2023-10-16 NOTE — Telephone Encounter (Signed)
*  Primary  Pharmacy Patient Advocate Encounter   Received notification from Patient Advice Request messages that prior authorization for Mounjaro  5MG /0.5ML auto-injectors  is required/requested.   Insurance verification completed.   The patient is insured through  [employer]  .   Per test claim: PA required; PA submitted to above mentioned insurance via CoverMyMeds Key/confirmation #/EOC [id number] Status is pending

## 2023-10-16 NOTE — Telephone Encounter (Signed)
 Noted

## 2023-10-19 ENCOUNTER — Ambulatory Visit: Payer: BC Managed Care – PPO | Admitting: Family Medicine

## 2023-10-31 ENCOUNTER — Telehealth: Payer: Self-pay | Admitting: Pharmacy Technician

## 2023-10-31 ENCOUNTER — Other Ambulatory Visit (HOSPITAL_COMMUNITY): Payer: Self-pay

## 2023-10-31 NOTE — Telephone Encounter (Signed)
 Pharmacy Patient Advocate Encounter   Received notification from  [employer]  that prior authorization for DESCOVY 200/25MG  TABLETS is required/requested.   Insurance verification completed.   The patient is insured through  [employer]  .   Per test claim: PA required; PA submitted to above mentioned insurance via CoverMyMeds Key/confirmation #/EOC [id number] Status is pending

## 2023-11-02 NOTE — Telephone Encounter (Signed)
 Pharmacy Patient Advocate Encounter  Received notification from [employer] that Prior Authorization for Descovy has been APPROVED from 09/12/2023 to 09/10/2024   PA #/Case ID/Reference #: Not given, please see approval letter attached to media tab

## 2023-11-08 ENCOUNTER — Encounter: Payer: Self-pay | Admitting: Family Medicine

## 2023-11-09 MED ORDER — MOUNJARO 7.5 MG/0.5ML ~~LOC~~ SOAJ: 7.5000 mg | SUBCUTANEOUS | 3 refills | Status: DC

## 2023-11-09 NOTE — Telephone Encounter (Signed)
 Okay.  He has been on the 5 mg weekly dose. I sent in the 7.5 mg weekly dose today.

## 2023-11-10 ENCOUNTER — Other Ambulatory Visit: Payer: Self-pay | Admitting: Cardiovascular Disease

## 2023-11-10 DIAGNOSIS — Z79899 Other long term (current) drug therapy: Secondary | ICD-10-CM

## 2023-11-10 DIAGNOSIS — I10 Essential (primary) hypertension: Secondary | ICD-10-CM

## 2023-11-10 DIAGNOSIS — R000 Tachycardia, unspecified: Secondary | ICD-10-CM

## 2023-11-13 NOTE — Telephone Encounter (Signed)
 Patient now moved up to 7.5mg 

## 2023-11-14 ENCOUNTER — Telehealth: Payer: Self-pay

## 2023-11-14 ENCOUNTER — Other Ambulatory Visit (HOSPITAL_COMMUNITY): Payer: Self-pay

## 2023-11-14 NOTE — Telephone Encounter (Signed)
 Pharmacy Patient Advocate Encounter   Received notification from  [facility] Portal that prior authorization for Mounjaro 7.5MG /0.5ML auto-injectors is required/requested.   Insurance verification completed.   The patient is insured through [facility]  .   Per test claim: PA required; PA submitted to above mentioned insurance via CoverMyMeds Key/confirmation #/EOC [id number] Status is pending

## 2023-11-27 NOTE — Patient Instructions (Signed)

## 2023-11-30 ENCOUNTER — Ambulatory Visit (INDEPENDENT_AMBULATORY_CARE_PROVIDER_SITE_OTHER): Payer: BC Managed Care – PPO | Admitting: Family Medicine

## 2023-11-30 ENCOUNTER — Encounter: Payer: Self-pay | Admitting: Family Medicine

## 2023-11-30 VITALS — BP 136/80 | HR 106 | Ht 71.0 in | Wt 394.2 lb

## 2023-11-30 DIAGNOSIS — Z7984 Long term (current) use of oral hypoglycemic drugs: Secondary | ICD-10-CM | POA: Diagnosis not present

## 2023-11-30 DIAGNOSIS — R809 Proteinuria, unspecified: Secondary | ICD-10-CM

## 2023-11-30 DIAGNOSIS — E119 Type 2 diabetes mellitus without complications: Secondary | ICD-10-CM

## 2023-11-30 DIAGNOSIS — F4323 Adjustment disorder with mixed anxiety and depressed mood: Secondary | ICD-10-CM

## 2023-11-30 DIAGNOSIS — Z79899 Other long term (current) drug therapy: Secondary | ICD-10-CM

## 2023-11-30 DIAGNOSIS — E7800 Pure hypercholesterolemia, unspecified: Secondary | ICD-10-CM | POA: Diagnosis not present

## 2023-11-30 DIAGNOSIS — I10 Essential (primary) hypertension: Secondary | ICD-10-CM

## 2023-11-30 DIAGNOSIS — F411 Generalized anxiety disorder: Secondary | ICD-10-CM

## 2023-11-30 DIAGNOSIS — T887XXA Unspecified adverse effect of drug or medicament, initial encounter: Secondary | ICD-10-CM

## 2023-11-30 LAB — LIPID PANEL
Cholesterol: 147 mg/dL (ref 0–200)
HDL: 35.1 mg/dL — ABNORMAL LOW (ref >39.00)
LDL Cholesterol: 88 mg/dL (ref 0–99)
NonHDL: 112.15
Total CHOL/HDL Ratio: 4
Triglycerides: 122 mg/dL (ref 0.0–149.0)
VLDL: 24.4 mg/dL (ref 0.0–40.0)

## 2023-11-30 LAB — POCT GLYCOSYLATED HEMOGLOBIN (HGB A1C)
HbA1c POC (<> result, manual entry): 5.9 % (ref 4.0–5.6)
HbA1c, POC (controlled diabetic range): 5.9 % (ref 0.0–7.0)
HbA1c, POC (prediabetic range): 5.9 % (ref 5.7–6.4)
Hemoglobin A1C: 5.9 % — AB (ref 4.0–5.6)

## 2023-11-30 LAB — COMPREHENSIVE METABOLIC PANEL
ALT: 25 U/L (ref 0–53)
AST: 25 U/L (ref 0–37)
Albumin: 4.2 g/dL (ref 3.5–5.2)
Alkaline Phosphatase: 67 U/L (ref 39–117)
BUN: 10 mg/dL (ref 6–23)
CO2: 35 meq/L — ABNORMAL HIGH (ref 19–32)
Calcium: 9.3 mg/dL (ref 8.4–10.5)
Chloride: 97 meq/L (ref 96–112)
Creatinine, Ser: 0.72 mg/dL (ref 0.40–1.50)
GFR: 103.17 mL/min (ref >60.00)
Glucose, Bld: 117 mg/dL — ABNORMAL HIGH (ref 70–99)
Potassium: 3.7 meq/L (ref 3.5–5.1)
Sodium: 140 meq/L (ref 135–145)
Total Bilirubin: 1 mg/dL (ref 0.2–1.2)
Total Protein: 6.9 g/dL (ref 6.0–8.3)

## 2023-11-30 LAB — MICROALBUMIN / CREATININE URINE RATIO
Creatinine,U: 190.8 mg/dL
Microalb Creat Ratio: 26.5 mg/g (ref 0.0–30.0)
Microalb, Ur: 5 mg/dL — ABNORMAL HIGH (ref 0.0–1.9)

## 2023-11-30 MED ORDER — VALSARTAN 40 MG PO TABS: 40.0000 mg | ORAL_TABLET | Freq: Every day | ORAL | 0 refills | Status: DC

## 2023-11-30 MED ORDER — TIRZEPATIDE 2.5 MG/0.5ML ~~LOC~~ SOAJ: 2.5000 mg | SUBCUTANEOUS | 2 refills | Status: DC

## 2023-11-30 NOTE — Progress Notes (Signed)
 OFFICE VISIT  11/30/2023  CC:  Chief Complaint  Patient presents with   Medical Management of Chronic Issues    Pt is fasting    Patient is a 53 y.o. male who presents for 24-month follow-up hypertension, diabetes, and GAD. A/P as of last visit: "#1 diabetes without complication. Great control on pioglitazone 15 mg a day, plus recent addition of Mounjaro 2.5 mg weekly. POC Hba1c today is 5.7%. No changes today.   2.  Hypertension, well-controlled on amlodipine 10 mg a day, hydralazine 25 mg 3 times a day, Lopressor 25 mg twice a day, and HCTZ 25 mg a day.   #3 GAD, with superimposed adjustment disorder with anxious mood. He has had good results with clonazepam 0.5 mg daily as needed.  He is using this responsibly/appropriately. Will get controlled substance contract on the books at next follow-up, urine drug screen at that time as well. A new prescription for this medication was not needed today."  INTERIM HX: He feels well. He has noted significant fluctuating lower extremity swelling, sounds like at least over the last couple months.  Usually worse at the end of the day and goes down by the morning time.  Home blood pressures normal.  He states he is taking his HCTZ, hydralazine, and amlodipine but not taking Lopressor, not clear why.  No home glucose monitoring.  The Mounjaro 7.5 mg dose made him bloated so he would like to decrease dose. He is not taking pioglitazone.  PMP AWARE reviewed today: most recent rx for clonazepam was filled 07/17/2023, # 90, rx by me. No red flags.   Past Medical History:  Diagnosis Date   Adenoma of left adrenal gland    incidental on CT abd 10/2018.   Bell's palsy    L side.  Pain involved (?)--got better with gabapentin.   Diabetes mellitus without complication (HCC)    hx of , no meds   Erosive gastritis 10/2018   EGD   Esophageal stenosis 10/2018   dilation done   High risk sexual behavior    sex with men: started PrEP (descovy)  08/15/19.   Hyperlipidemia    75), HDL 27, TG 105. LDL goal= <110, no per pt   Hypertension    Low testosterone    2020/21   Morbid obesity (HCC)    BMI>50   NASH (nonalcoholic steatohepatitis)    Rapid weight loss 2020   Purposeful   Seasonal allergies    RAD with RTIs only    Past Surgical History:  Procedure Laterality Date   COLONOSCOPY W/ POLYPECTOMY  10/27/2019   Polyp x 1->submucosal lipoma.  Recall 10 yrs.   ESOPHAGOGASTRODUODENOSCOPY  10/29/2018   LASIK  2000   Bilaterally   TONSILLECTOMY     UPPER GASTROINTESTINAL ENDOSCOPY  2020    Outpatient Medications Prior to Visit  Medication Sig Dispense Refill   clonazePAM (KLONOPIN) 0.5 MG tablet 1 tab po qd 90 tablet 0   emtricitabine-tenofovir AF (DESCOVY) 200-25 MG tablet Take 1 tablet by mouth daily. 30 tablet 11   hydrALAZINE (APRESOLINE) 25 MG tablet TAKE 1 TABLET BY MOUTH THREE TIMES A DAY 90 tablet 5   hydrochlorothiazide (HYDRODIURIL) 25 MG tablet Take 1 tablet (25 mg total) by mouth daily. Please call office to schedule an appt for further refills. Thank you 30 tablet 0   pantoprazole (PROTONIX) 40 MG tablet TAKE 1 TABLET BY MOUTH DAILY 180 tablet 1   amLODipine (NORVASC) 10 MG tablet TAKE 1 TABLET BY  MOUTH EVERY DAY 90 tablet 1   metoprolol tartrate (LOPRESSOR) 25 MG tablet Take 1 tablet (25 mg total) by mouth 2 (two) times daily. 180 tablet 3   pioglitazone (ACTOS) 15 MG tablet Take 1 tablet (15 mg total) by mouth daily. 90 tablet 0   HYDROcodone bit-homatropine (HYCODAN) 5-1.5 MG/5ML syrup 1-2 tsp po bid prn cough 120 mL 0   tirzepatide (MOUNJARO) 7.5 MG/0.5ML Pen Inject 7.5 mg into the skin once a week. (Patient not taking: Reported on 11/30/2023) 2 mL 3   No facility-administered medications prior to visit.    Allergies  Allergen Reactions   Penicillins     Rash    Sulfonamide Derivatives     REACTION: rash Tongue swelling   Sulfa Antibiotics Swelling    Tongue swelling   Metformin And Related Other  (See Comments)    Hypoglyc+ GI s/e    Review of Systems As per HPI  PE:    11/30/2023    8:54 AM 10/11/2023    4:00 PM 09/21/2023   10:51 AM  Vitals with BMI  Height 5\' 11"     Weight 394 lbs 3 oz 395 lbs 391 lbs 3 oz  BMI 55    Systolic 136 138 962  Diastolic 80 83 85  Pulse 106 95 92     Physical Exam  Gen: Alert, well appearing.  Patient is oriented to person, place, time, and situation. AFFECT: pleasant, lucid thought and speech. EXT: No pitting edema.  He has a mild amount of nonpitting soft tissue swelling in the lower legs.  LABS:  Last CBC Lab Results  Component Value Date   WBC 7.2 05/31/2023   HGB 13.5 05/31/2023   HCT 42.5 05/31/2023   MCV 83.4 05/31/2023   MCH 27.2 08/25/2022   RDW 15.9 (H) 05/31/2023   PLT 194.0 05/31/2023   Last metabolic panel Lab Results  Component Value Date   GLUCOSE 136 (H) 05/31/2023   NA 141 05/31/2023   K 3.6 05/31/2023   CL 99 05/31/2023   CO2 32 05/31/2023   BUN 12 05/31/2023   CREATININE 0.71 05/31/2023   GFR 103.97 05/31/2023   CALCIUM 9.0 05/31/2023   PROT 7.1 05/31/2023   ALBUMIN 4.1 05/31/2023   BILITOT 0.8 05/31/2023   ALKPHOS 69 05/31/2023   AST 29 05/31/2023   ALT 29 05/31/2023   ANIONGAP 7 08/25/2022   Last lipids Lab Results  Component Value Date   CHOL 141 05/31/2023   HDL 37.10 (L) 05/31/2023   LDLCALC 70 05/31/2023   LDLDIRECT 92.0 03/30/2015   TRIG 170.0 (H) 05/31/2023   CHOLHDL 4 05/31/2023   Last hemoglobin A1c Lab Results  Component Value Date   HGBA1C 5.9 (A) 11/30/2023   HGBA1C 5.9 11/30/2023   HGBA1C 5.9 11/30/2023   HGBA1C 5.9 11/30/2023   Last thyroid functions Lab Results  Component Value Date   TSH 1.71 05/31/2023   T4TOTAL 6.7 06/10/2021   IMPRESSION AND PLAN:  1 diabetes without complication. Great control. POC Hba1c today is 5.9%. He tolerated Mounjaro at the 2.5 mg dose and was losing weight appropriately.  7.5 mg dose to much bloating. New prescription for 2.5  mg weekly dose prescribed today.  No additional diabetic medication at this time. Urine microalbumin/creatinine today.  2.  Hypertension, well-controlled on amlodipine 10 mg a day, hydralazine 25 mg 3 times a day, and HCTZ 25 mg a day.  Suspect his lower extremity swelling is from the amlodipine. Will DC amlodipine and  start valsartan 40 mg a day.  Of note in review of records it shows he had some low blood pressures when on high-dose valsartan so we will be cautious with increasing this medication in the future. Electrolytes and creatinine monitoring today.  #3 GAD, with superimposed adjustment disorder with anxious mood. He has had good results with clonazepam 0.5 mg daily as needed.  He is using this responsibly/appropriately. UDS today.  An After Visit Summary was printed and given to the patient.  FOLLOW UP: Return in about 2 weeks (around 12/14/2023), or f/u HTN/Legs swelling. Next CPE 05/2024 Signed:  Santiago Bumpers, MD           11/30/2023

## 2023-12-03 ENCOUNTER — Encounter: Payer: Self-pay | Admitting: Family Medicine

## 2023-12-03 LAB — DRUG MONITORING PANEL 376104, URINE
Amphetamines: NEGATIVE ng/mL (ref <500)
Barbiturates: NEGATIVE ng/mL (ref <300)
Benzodiazepines: NEGATIVE ng/mL (ref <100)
Cocaine Metabolite: NEGATIVE ng/mL (ref <150)
Desmethyltramadol: NEGATIVE ng/mL (ref <100)
Opiates: NEGATIVE ng/mL (ref <100)
Oxycodone: NEGATIVE ng/mL (ref <100)
Tramadol: NEGATIVE ng/mL (ref <100)

## 2023-12-03 LAB — DM TEMPLATE

## 2023-12-04 ENCOUNTER — Other Ambulatory Visit (HOSPITAL_COMMUNITY): Payer: Self-pay

## 2023-12-04 ENCOUNTER — Telehealth: Payer: Self-pay | Admitting: Pharmacy Technician

## 2023-12-04 NOTE — Telephone Encounter (Signed)
 Waiting for insurance determination.

## 2023-12-04 NOTE — Telephone Encounter (Signed)
 Please assist with PA for [facility]

## 2023-12-04 NOTE — Telephone Encounter (Signed)
 Pharmacy Patient Advocate Encounter   Received notification from Pt Calls Messages that prior authorization for [facility] 2.5MG  is required/requested.   Insurance verification completed.   The patient is insured through [employer]  .   Per test claim: [id number]

## 2023-12-05 ENCOUNTER — Other Ambulatory Visit: Payer: Self-pay | Admitting: Family Medicine

## 2023-12-06 NOTE — Telephone Encounter (Signed)
 Waiting for insurance determination.

## 2023-12-07 ENCOUNTER — Other Ambulatory Visit (HOSPITAL_COMMUNITY): Payer: Self-pay

## 2023-12-10 ENCOUNTER — Other Ambulatory Visit (HOSPITAL_COMMUNITY): Payer: Self-pay

## 2023-12-11 ENCOUNTER — Other Ambulatory Visit (HOSPITAL_COMMUNITY): Payer: Self-pay

## 2023-12-11 NOTE — Telephone Encounter (Signed)
 Pharmacy Patient Advocate Encounter  Received notification from [facility]  that Prior Authorization for Mounjaro 2.5MG /0.5ML auto-injectors  has been APPROVED.  The approval is good until 06/10/24 for all [facility] strengths. However every single time that the pharmacy runs the claim to fill Dwayne Lewis Dwayne Lewis they must put PA number [phone] in order to get a paid claim, I just got off the phone with [facility] and spoke to [facility] and  She did get a paid claim today and also put a note in Dwayne Lewis file with the PA number for future fills   Thanks

## 2023-12-12 ENCOUNTER — Other Ambulatory Visit: Payer: Self-pay | Admitting: Cardiovascular Disease

## 2023-12-12 DIAGNOSIS — Z79899 Other long term (current) drug therapy: Secondary | ICD-10-CM

## 2023-12-12 DIAGNOSIS — I10 Essential (primary) hypertension: Secondary | ICD-10-CM

## 2023-12-12 DIAGNOSIS — E66813 Obesity, class 3: Secondary | ICD-10-CM

## 2023-12-12 DIAGNOSIS — R000 Tachycardia, unspecified: Secondary | ICD-10-CM

## 2023-12-14 ENCOUNTER — Ambulatory Visit (INDEPENDENT_AMBULATORY_CARE_PROVIDER_SITE_OTHER): Admitting: Family Medicine

## 2023-12-14 ENCOUNTER — Encounter: Payer: Self-pay | Admitting: Family Medicine

## 2023-12-14 VITALS — BP 101/65 | HR 98 | Ht 71.0 in | Wt >= 6400 oz

## 2023-12-14 DIAGNOSIS — R600 Localized edema: Secondary | ICD-10-CM | POA: Diagnosis not present

## 2023-12-14 DIAGNOSIS — F40240 Claustrophobia: Secondary | ICD-10-CM

## 2023-12-14 DIAGNOSIS — I10 Essential (primary) hypertension: Secondary | ICD-10-CM | POA: Diagnosis not present

## 2023-12-14 DIAGNOSIS — R7989 Other specified abnormal findings of blood chemistry: Secondary | ICD-10-CM

## 2023-12-14 LAB — TESTOSTERONE: Testosterone: 272.07 ng/dL — ABNORMAL LOW (ref 300.00–890.00)

## 2023-12-14 LAB — LUTEINIZING HORMONE: LH: 4.79 m[IU]/mL (ref 1.50–9.30)

## 2023-12-14 MED ORDER — METOPROLOL TARTRATE 50 MG PO TABS: 50.0000 mg | ORAL_TABLET | Freq: Two times a day (BID) | ORAL | 0 refills | Status: DC

## 2023-12-14 NOTE — Progress Notes (Signed)
 OFFICE VISIT  12/14/2023  CC:  Chief Complaint  Patient presents with   Medical Management of Chronic Issues    2 week HTN    Patient is a 53 y.o. male who presents for 2-week follow-up hypertension and legs swelling. A/P as of last visit: "1 diabetes without complication. Great control. POC Hba1c today is 5.9%. He tolerated Mounjaro at the 2.5 mg dose and was losing weight appropriately.  7.5 mg dose to much bloating. New prescription for 2.5 mg weekly dose prescribed today.  No additional diabetic medication at this time. Urine microalbumin/creatinine today.   2.  Hypertension, well-controlled on amlodipine 10 mg a day, hydralazine 25 mg 3 times a day, and HCTZ 25 mg a day.  Suspect his lower extremity swelling is from the amlodipine. Will DC amlodipine and start valsartan 40 mg a day.  Of note in review of records it shows he had some low blood pressures when on high-dose valsartan so we will be cautious with increasing this medication in the future. Electrolytes and creatinine monitoring today.   #3 GAD, with superimposed adjustment disorder with anxious mood. He has had good results with clonazepam 0.5 mg daily as needed.  He is using this responsibly/appropriately. UDS today."  INTERIM HX: Blood pressures still 150s over 90s at home. Lowest in the 120s over 80s. Has had a headache since starting the valsartan hand. He has had no lower extremity swelling since I last saw him.  He is chronically fatigued.  Decreased libido.  Erectile function is normal. Has a history of low testosterone, last checked in 2023.  States he has never been on testosterone supplement. He snores.  He has never been told that he has apneic events in sleep. He says he wakes up feeling rested and has good energy until about 11 AM.  ROS: no palpitations, shortness of breath, fever, chest pain, rash, or lower extremity swelling  Past Medical History:  Diagnosis Date   Adenoma of left adrenal gland     incidental on CT abd 10/2018.   Bell's palsy    L side.  Pain involved (?)--got better with gabapentin.   Diabetes mellitus without complication (HCC)    hx of , no meds   Erosive gastritis 10/2018   EGD   Esophageal stenosis 10/2018   dilation done   High risk sexual behavior    sex with men: started PrEP (descovy) 08/15/19.   Hyperlipidemia    75), HDL 27, TG 105. LDL goal= <110, no per pt   Hypertension    Low testosterone    2020/21   Morbid obesity (HCC)    BMI>50   NASH (nonalcoholic steatohepatitis)    Rapid weight loss 2020   Purposeful   Seasonal allergies    RAD with RTIs only    Past Surgical History:  Procedure Laterality Date   COLONOSCOPY W/ POLYPECTOMY  10/27/2019   Polyp x 1->submucosal lipoma.  Recall 10 yrs.   ESOPHAGOGASTRODUODENOSCOPY  10/29/2018   LASIK  2000   Bilaterally   TONSILLECTOMY     UPPER GASTROINTESTINAL ENDOSCOPY  2020    Outpatient Medications Prior to Visit  Medication Sig Dispense Refill   clonazePAM (KLONOPIN) 0.5 MG tablet 1 tab po qd 90 tablet 0   emtricitabine-tenofovir AF (DESCOVY) 200-25 MG tablet Take 1 tablet by mouth daily. 30 tablet 11   hydrALAZINE (APRESOLINE) 25 MG tablet TAKE 1 TABLET BY MOUTH THREE TIMES A DAY 90 tablet 5   hydrochlorothiazide (HYDRODIURIL) 25 MG tablet  Take 1 tablet (25 mg total) by mouth daily. 15 tablet 0   pantoprazole (PROTONIX) 40 MG tablet TAKE 1 TABLET BY MOUTH DAILY 180 tablet 1   tirzepatide (MOUNJARO) 2.5 MG/0.5ML Pen Inject 2.5 mg into the skin once a week. 2 mL 2   valsartan (DIOVAN) 40 MG tablet Take 1 tablet (40 mg total) by mouth daily. 30 tablet 0   No facility-administered medications prior to visit.    Allergies  Allergen Reactions   Penicillins     Rash    Sulfonamide Derivatives     REACTION: rash Tongue swelling   Sulfa Antibiotics Swelling    Tongue swelling   Metformin And Related Other (See Comments)    Hypoglyc+ GI s/e    Review of Systems As per  HPI  PE:    12/14/2023    1:01 PM 11/30/2023    8:54 AM 10/11/2023    4:00 PM  Vitals with BMI  Height 5\' 11"  5\' 11"    Weight 402 lbs 6 oz 394 lbs 3 oz 395 lbs  BMI 56.15 55   Systolic 101 136 161  Diastolic 65 80 83  Pulse 98 106 95     Physical Exam  Gen: Alert, well appearing.  Patient is oriented to person, place, time, and situation. AFFECT: pleasant, lucid thought and speech. CV: RRR (rate 100), no m/r/g.   LUNGS: CTA bilat, nonlabored resps, good aeration in all lung fields. Extremities: No edema  LABS:  Last metabolic panel Lab Results  Component Value Date   GLUCOSE 117 (H) 11/30/2023   NA 140 11/30/2023   K 3.7 11/30/2023   CL 97 11/30/2023   CO2 35 (H) 11/30/2023   BUN 10 11/30/2023   CREATININE 0.72 11/30/2023   GFR 103.17 11/30/2023   CALCIUM 9.3 11/30/2023   PROT 6.9 11/30/2023   ALBUMIN 4.2 11/30/2023   BILITOT 1.0 11/30/2023   ALKPHOS 67 11/30/2023   AST 25 11/30/2023   ALT 25 11/30/2023   ANIONGAP 7 08/25/2022   Last hemoglobin A1c Lab Results  Component Value Date   HGBA1C 5.9 (A) 11/30/2023   HGBA1C 5.9 11/30/2023   HGBA1C 5.9 11/30/2023   HGBA1C 5.9 11/30/2023   Lab Results  Component Value Date   TESTOSTERONE 245 (L) 10/13/2021   IMPRESSION AND PLAN:  #1 uncontrolled hypertension. Not tolerating valsartan. Add Lopressor 50 mg twice a day.  Continue HCTZ 25 mg a day and hydralazine 25 mg 3 times a day.  2.  Lower extremity edema.  This has resolved off of amlodipine.  #3 chronic fatigue.  History of low testosterone.  Recheck testosterone level today as well as LH and prolactin levels. Still have to consider OSA.  #4 diabetes.  He just got back on his Mounjaro 2.5 mg this week due to insurance approval delays.   An After Visit Summary was printed and given to the patient.  FOLLOW UP: Return in about 2 weeks (around 12/28/2023) for f/u HTN. Next CPE 05/2024 Signed:  Santiago Bumpers, MD           12/14/2023

## 2023-12-14 NOTE — Telephone Encounter (Signed)
 Pharmacy Patient Advocate Encounter  Received notification from [facility]  that Prior Authorization for [facility] 7.5MG /0.5ML auto-injectors has been APPROVED from 12/11/23 to 12/10/24   PA #/Case ID/Reference #: [id number]

## 2023-12-15 LAB — PROLACTIN: Prolactin: 5.9 ng/mL (ref 2.0–18.0)

## 2023-12-16 ENCOUNTER — Other Ambulatory Visit: Payer: Self-pay | Admitting: Family Medicine

## 2023-12-16 ENCOUNTER — Encounter: Payer: Self-pay | Admitting: Family Medicine

## 2023-12-16 DIAGNOSIS — R7989 Other specified abnormal findings of blood chemistry: Secondary | ICD-10-CM

## 2023-12-21 ENCOUNTER — Other Ambulatory Visit (INDEPENDENT_AMBULATORY_CARE_PROVIDER_SITE_OTHER)

## 2023-12-21 DIAGNOSIS — R861 Abnormal level of hormones in specimens from male genital organs: Secondary | ICD-10-CM

## 2023-12-21 DIAGNOSIS — R7989 Other specified abnormal findings of blood chemistry: Secondary | ICD-10-CM

## 2023-12-21 LAB — TESTOSTERONE: Testosterone: 293.39 ng/dL — ABNORMAL LOW (ref 300.00–890.00)

## 2023-12-23 ENCOUNTER — Encounter: Payer: Self-pay | Admitting: Family Medicine

## 2023-12-23 ENCOUNTER — Other Ambulatory Visit: Payer: Self-pay | Admitting: Cardiovascular Disease

## 2023-12-23 ENCOUNTER — Other Ambulatory Visit: Payer: Self-pay | Admitting: Family Medicine

## 2023-12-23 DIAGNOSIS — I10 Essential (primary) hypertension: Secondary | ICD-10-CM

## 2023-12-23 DIAGNOSIS — R000 Tachycardia, unspecified: Secondary | ICD-10-CM

## 2023-12-23 DIAGNOSIS — Z79899 Other long term (current) drug therapy: Secondary | ICD-10-CM

## 2023-12-23 DIAGNOSIS — E291 Testicular hypofunction: Secondary | ICD-10-CM

## 2023-12-23 MED ORDER — TESTOSTERONE CYPIONATE 200 MG/ML IJ SOLN: INTRAMUSCULAR | 1 refills | Status: DC

## 2024-01-05 ENCOUNTER — Other Ambulatory Visit: Payer: Self-pay | Admitting: Family Medicine

## 2024-01-09 ENCOUNTER — Other Ambulatory Visit: Payer: Self-pay | Admitting: Family Medicine

## 2024-01-11 ENCOUNTER — Ambulatory Visit (INDEPENDENT_AMBULATORY_CARE_PROVIDER_SITE_OTHER): Admitting: Family Medicine

## 2024-01-11 ENCOUNTER — Telehealth: Payer: Self-pay

## 2024-01-11 ENCOUNTER — Encounter: Payer: Self-pay | Admitting: Family Medicine

## 2024-01-11 VITALS — BP 105/70 | HR 78 | Ht 71.0 in | Wt 394.8 lb

## 2024-01-11 DIAGNOSIS — I10 Essential (primary) hypertension: Secondary | ICD-10-CM | POA: Diagnosis not present

## 2024-01-11 DIAGNOSIS — Z7985 Long-term (current) use of injectable non-insulin antidiabetic drugs: Secondary | ICD-10-CM

## 2024-01-11 DIAGNOSIS — F40240 Claustrophobia: Secondary | ICD-10-CM

## 2024-01-11 DIAGNOSIS — E119 Type 2 diabetes mellitus without complications: Secondary | ICD-10-CM

## 2024-01-11 DIAGNOSIS — E291 Testicular hypofunction: Secondary | ICD-10-CM | POA: Diagnosis not present

## 2024-01-11 MED ORDER — PANTOPRAZOLE SODIUM 40 MG PO TBEC: DELAYED_RELEASE_TABLET | ORAL | 3 refills | Status: AC

## 2024-01-11 MED ORDER — TIRZEPATIDE 5 MG/0.5ML ~~LOC~~ SOAJ: 5.0000 mg | SUBCUTANEOUS | 0 refills | Status: DC

## 2024-01-11 MED ORDER — METOPROLOL TARTRATE 50 MG PO TABS: 50.0000 mg | ORAL_TABLET | Freq: Two times a day (BID) | ORAL | 3 refills | Status: AC

## 2024-01-11 MED ORDER — CLONAZEPAM 0.5 MG PO TBDP: 0.5000 mg | ORAL_TABLET | Freq: Two times a day (BID) | ORAL | 0 refills | Status: DC

## 2024-01-11 NOTE — Progress Notes (Signed)
 OFFICE VISIT  01/11/2024  CC:  Chief Complaint  Patient presents with   Hypertension    2 week f/u HTN    Patient is a 53 y.o. male who presents for 1 month follow-up uncontrolled hypertension, chronic fatigue, and recently diagnosed low testosterone . A/P as of last visit: "#1 uncontrolled hypertension. Not tolerating valsartan . Add Lopressor  50 mg twice a day.  Continue HCTZ 25 mg a day and hydralazine  25 mg 3 times a day.   2.  Lower extremity edema.  This has resolved off of amlodipine .   #3 chronic fatigue.  History of low testosterone .  Recheck testosterone  level today as well as LH and prolactin levels. Still have to consider OSA.   #4 diabetes.  He just got back on his Mounjaro  2.5 mg this week due to insurance approval delays. "  INTERIM HX: Testosterone  came back low on 2 consecutive measurements.  I prescribed injections but something happened with the prescription/pharmacy and he has not got the medication.  He has been doing well on Mounjaro  2.5 mg q. 7 days.  Most recent injection 5 days ago. No home glucose monitoring.  Home blood pressure monitoring shows systolic consistently in the 120s to 130 over 70s diastolic.  ROS as above, plus--> +decreased libido.  no fevers, no CP, no SOB, no wheezing, no cough, no dizziness, no HAs, no rashes, no melena/hematochezia.  No polyuria or polydipsia.  No myalgias or arthralgias.  No focal weakness, paresthesias, or tremors.  No acute vision or hearing abnormalities.  No dysuria or unusual/new urinary urgency or frequency.  No recent changes in lower legs. No n/v/d or abd pain.  No palpitations.       PMP AWARE reviewed today: most recent rx for clonazepam  0.5 mg tablet was filled 07/17/2023, # 90, rx by me. No red flags.  Past Medical History:  Diagnosis Date   Adenoma of left adrenal gland    incidental on CT abd 10/2018.   Bell's palsy    L side.  Pain involved (?)--got better with gabapentin .   Diabetes mellitus  without complication (HCC)    hx of , no meds   Erosive gastritis 10/2018   EGD   Esophageal stenosis 10/2018   dilation done   High risk sexual behavior    sex with men: started PrEP (descovy ) 08/15/19.   Hyperlipidemia    75), HDL 27, TG 105. LDL goal= <110, no per pt   Hypertension    Morbid obesity (HCC)    BMI>50   NASH (nonalcoholic steatohepatitis)    Rapid weight loss 2020   Purposeful   Seasonal allergies    RAD with RTIs only   Secondary male hypogonadism     Past Surgical History:  Procedure Laterality Date   COLONOSCOPY W/ POLYPECTOMY  10/27/2019   Polyp x 1->submucosal lipoma.  Recall 10 yrs.   ESOPHAGOGASTRODUODENOSCOPY  10/29/2018   LASIK  2000   Bilaterally   TONSILLECTOMY     UPPER GASTROINTESTINAL ENDOSCOPY  2020    Outpatient Medications Prior to Visit  Medication Sig Dispense Refill   emtricitabine-tenofovir AF (DESCOVY ) 200-25 MG tablet Take 1 tablet by mouth daily. 30 tablet 11   hydrALAZINE  (APRESOLINE ) 25 MG tablet TAKE 1 TABLET BY MOUTH THREE TIMES A DAY 90 tablet 5   hydrochlorothiazide  (HYDRODIURIL ) 25 MG tablet Take 1 tablet (25 mg total) by mouth daily. 15 tablet 0   clonazePAM  (KLONOPIN ) 0.5 MG tablet 1 tab po qd 90 tablet 0   Testosterone   Cypionate 200 MG/ML SOLN 200 mg IM every 2 weeks (Patient not taking: Reported on 01/11/2024) 6 mL 1   metoprolol  tartrate (LOPRESSOR ) 50 MG tablet Take 1 tablet (50 mg total) by mouth 2 (two) times daily. 60 tablet 0   pantoprazole  (PROTONIX ) 40 MG tablet TAKE 1 TABLET BY MOUTH DAILY 180 tablet 1   tirzepatide  (MOUNJARO ) 2.5 MG/0.5ML Pen Inject 2.5 mg into the skin once a week. (Patient not taking: Reported on 01/11/2024) 2 mL 2   No facility-administered medications prior to visit.    Allergies  Allergen Reactions   Penicillins     Rash    Sulfonamide Derivatives     REACTION: rash Tongue swelling   Sulfa Antibiotics Swelling    Tongue swelling   Metformin  And Related Other (See Comments)     Hypoglyc+ GI s/e    Review of Systems As per HPI  PE:    01/11/2024    8:59 AM 12/14/2023    1:01 PM 11/30/2023    8:54 AM  Vitals with BMI  Height 5\' 11"  5\' 11"  5\' 11"   Weight 394 lbs 13 oz 402 lbs 6 oz 394 lbs 3 oz  BMI 55.09 56.15 55  Systolic 105 101 161  Diastolic 70 65 80  Pulse 78 98 106     Physical Exam  Gen: Alert, well appearing.  Patient is oriented to person, place, time, and situation. AFFECT: pleasant, lucid thought and speech. No further exam today  LABS:  Last CBC Lab Results  Component Value Date   WBC 7.2 05/31/2023   HGB 13.5 05/31/2023   HCT 42.5 05/31/2023   MCV 83.4 05/31/2023   MCH 27.2 08/25/2022   RDW 15.9 (H) 05/31/2023   PLT 194.0 05/31/2023   Last metabolic panel Lab Results  Component Value Date   GLUCOSE 117 (H) 11/30/2023   NA 140 11/30/2023   K 3.7 11/30/2023   CL 97 11/30/2023   CO2 35 (H) 11/30/2023   BUN 10 11/30/2023   CREATININE 0.72 11/30/2023   GFR 103.17 11/30/2023   CALCIUM 9.3 11/30/2023   PROT 6.9 11/30/2023   ALBUMIN 4.2 11/30/2023   BILITOT 1.0 11/30/2023   ALKPHOS 67 11/30/2023   AST 25 11/30/2023   ALT 25 11/30/2023   ANIONGAP 7 08/25/2022   Last lipids Lab Results  Component Value Date   CHOL 147 11/30/2023   HDL 35.10 (L) 11/30/2023   LDLCALC 88 11/30/2023   LDLDIRECT 92.0 03/30/2015   TRIG 122.0 11/30/2023   CHOLHDL 4 11/30/2023   Last hemoglobin A1c Lab Results  Component Value Date   HGBA1C 5.9 (A) 11/30/2023   HGBA1C 5.9 11/30/2023   HGBA1C 5.9 11/30/2023   HGBA1C 5.9 11/30/2023   Last thyroid  functions Lab Results  Component Value Date   TSH 1.71 05/31/2023   T4TOTAL 6.7 06/10/2021   Lab Results  Component Value Date   TESTOSTERONE  293.39 (L) 12/21/2023  Testosterone  12/14/23 was 272  Lab Results  Component Value Date   PSA 0.75 05/31/2023   PSA 1.23 10/13/2021   PSA 2.23 09/29/2019   IMPRESSION AND PLAN:  #1 hypertension, well-controlled on hydralazine  25 mg 3 times daily,  HCTZ 25 mg daily, and Lopressor  50 mg twice a day. No changes today.  2.  Diabetes without complication.  He did not tolerate metformin . Doing well with low-dose Mounjaro .  Will move up to the 5 mg weekly dose now. Next A1c after 03/01/2024.  #3 decreased libido, chronic fatigue, low testosterone . He  has secondary male hypogonadism. Prior authorizatio for the testosterone  injection was done today while he was in the office and we are awaiting the response.  An After Visit Summary was printed and given to the patient.  FOLLOW UP: Return in about 2 months (around 03/12/2024) for routine chronic illness f/u. Next CPE 05/2024 Signed:  Arletha Lady, MD           01/11/2024

## 2024-01-11 NOTE — Telephone Encounter (Signed)
 PA sent via covermymed on 01/11/24   Key: [facility]) - [id number]   Medication: Mounjaro  5mg /0.23ml auto injectors   Dx: Controlled type 2 diabetes mellitus without complication, without long-term current use of insulin  (E11.9)   Per Dr. 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

## 2024-01-18 ENCOUNTER — Ambulatory Visit (INDEPENDENT_AMBULATORY_CARE_PROVIDER_SITE_OTHER)

## 2024-01-18 DIAGNOSIS — E291 Testicular hypofunction: Secondary | ICD-10-CM | POA: Diagnosis not present

## 2024-01-18 MED ORDER — TESTOSTERONE CYPIONATE 200 MG/ML IM SOLN
200.0000 mg | Freq: Once | INTRAMUSCULAR | Status: AC
  Administered 2024-01-18: 200 mg via INTRAMUSCULAR

## 2024-01-18 NOTE — Progress Notes (Signed)
 Dwayne Lewis is a 53 y.o. male presents to the office today for 1st testosterone  cypionate injection, per provider pt is due to follow up for repeat lab in 1 week.    Meridith Romick D Malie Kashani

## 2024-01-25 ENCOUNTER — Other Ambulatory Visit (INDEPENDENT_AMBULATORY_CARE_PROVIDER_SITE_OTHER)

## 2024-01-25 ENCOUNTER — Ambulatory Visit: Admitting: Family Medicine

## 2024-01-25 DIAGNOSIS — E291 Testicular hypofunction: Secondary | ICD-10-CM | POA: Diagnosis not present

## 2024-01-29 ENCOUNTER — Other Ambulatory Visit (HOSPITAL_COMMUNITY): Payer: Self-pay

## 2024-01-29 ENCOUNTER — Ambulatory Visit: Payer: Self-pay | Admitting: Family Medicine

## 2024-01-29 ENCOUNTER — Telehealth: Payer: Self-pay

## 2024-01-29 LAB — TESTOSTERONE: Testosterone: 827.35 ng/dL (ref 300.00–890.00)

## 2024-01-29 NOTE — Telephone Encounter (Signed)
 Pharmacy Patient Advocate Encounter   Received notification from [employer] that prior authorization for Mounjaro  5MG /0.5ML auto-injectors is required/requested.   Insurance verification completed.   The patient is insured through [employer] .   Per test claim: PA required; PA submitted to above mentioned insurance via [employer]MyMeds Key/confirmation #/EOC [facility] Status is pending

## 2024-02-01 ENCOUNTER — Ambulatory Visit (INDEPENDENT_AMBULATORY_CARE_PROVIDER_SITE_OTHER)

## 2024-02-01 DIAGNOSIS — E291 Testicular hypofunction: Secondary | ICD-10-CM | POA: Diagnosis not present

## 2024-02-01 MED ORDER — TESTOSTERONE CYPIONATE 200 MG/ML IM SOLN
200.0000 mg | INTRAMUSCULAR | Status: DC
  Administered 2024-02-01 – 2024-04-11 (×5): 200 mg via INTRAMUSCULAR

## 2024-02-01 NOTE — Progress Notes (Signed)
 Dwayne Lewis is a 53 y.o. male presents to the office today for testosterone  cypionate injection, per original order dated 01/25/24: "continue injection every 2 weeks".  Last testosterone  injection: 01/18/24  Last testosterone  level: 01/25/24  Testosterone  200mg /mL given IM right ventrogluteal, pt tolerated well.  Next injection scheduled for 02/15/24

## 2024-02-15 ENCOUNTER — Ambulatory Visit

## 2024-02-15 DIAGNOSIS — E291 Testicular hypofunction: Secondary | ICD-10-CM

## 2024-02-15 NOTE — Progress Notes (Signed)
 Pt here for testosterone  injection per original order dated: 01/25/24 (continue injection every 2 weeks)  Last testosterone  injection: 02/01/24  Last testosterone  level:  01/25/24  Testosterone  200mg  given IM left ventrogluteal , and pt tolerated injection well.  Next injection scheduled for: 2 weeks

## 2024-02-20 ENCOUNTER — Other Ambulatory Visit (HOSPITAL_COMMUNITY): Payer: Self-pay

## 2024-02-20 NOTE — Telephone Encounter (Signed)
 Pharmacy Patient Advocate Encounter  Received notification from [employer] that Prior Authorization for [employer] has been APPROVED from 02/19/24 to 06/10/24. Ran test claim, Copay is $25. This test claim was processed through [facility]- copay amounts may vary at other pharmacies due to pharmacy/plan contracts, or as the patient moves through the different stages of their insurance plan.   PA #/Case ID/Reference #: [id number]

## 2024-02-29 ENCOUNTER — Ambulatory Visit (INDEPENDENT_AMBULATORY_CARE_PROVIDER_SITE_OTHER)

## 2024-02-29 DIAGNOSIS — E291 Testicular hypofunction: Secondary | ICD-10-CM | POA: Diagnosis not present

## 2024-02-29 NOTE — Progress Notes (Signed)
 Pt here for testosterone  injection per original order dated: 01/25/24 (continue injection every 2 weeks)   Last testosterone  injection: 02/15/24   Last testosterone  level:  01/25/24   Testosterone  200mg  given IM left ventrogluteal , and pt tolerated injection well.   Next injection scheduled for: 2 weeks

## 2024-03-21 ENCOUNTER — Ambulatory Visit

## 2024-03-21 ENCOUNTER — Ambulatory Visit: Admitting: Family Medicine

## 2024-03-28 ENCOUNTER — Ambulatory Visit (INDEPENDENT_AMBULATORY_CARE_PROVIDER_SITE_OTHER)

## 2024-03-28 DIAGNOSIS — R7989 Other specified abnormal findings of blood chemistry: Secondary | ICD-10-CM

## 2024-03-28 NOTE — Progress Notes (Signed)
 Pt here for biweekly testosterone injection per Dr Milinda Cave   Injection tolerated well.   Next injection scheduled for 2 weeks

## 2024-04-11 ENCOUNTER — Other Ambulatory Visit: Payer: Self-pay

## 2024-04-11 ENCOUNTER — Ambulatory Visit

## 2024-04-11 DIAGNOSIS — E291 Testicular hypofunction: Secondary | ICD-10-CM | POA: Diagnosis not present

## 2024-04-11 MED ORDER — TESTOSTERONE CYPIONATE 200 MG/ML IJ SOLN: INTRAMUSCULAR | 1 refills | Status: DC

## 2024-04-11 NOTE — Progress Notes (Signed)
 Pt here for biweekly testosterone  injection per Dr McGowen  Injection tolerated well.   Pts last lab was 01/25/24  Next injection not yet scheduled. But due in 2 weeks. Pt does need refill sent to pharmacy

## 2024-04-11 NOTE — Telephone Encounter (Signed)
 Requesting: Testosterone  Contract:n/a UDS:11/30/23 Last Visit:01/11/24 Next Visit:05/02/24 Last Refill:12/23/23 (64mL,1)  Please Advise

## 2024-04-23 ENCOUNTER — Encounter: Payer: Self-pay | Admitting: Family Medicine

## 2024-04-23 NOTE — Telephone Encounter (Signed)
 Ozell Lipoma (Key: [id number])  Sent to Plan today 04/23/24  Drug: Testosterone Cypionate 200MG /ML intramuscular solution  [employer] Non-Formulary Exception Request Form  Phone: [phone]  Fax: [phone]

## 2024-04-23 NOTE — Telephone Encounter (Signed)
 No further action needed at this time.

## 2024-04-23 NOTE — Telephone Encounter (Signed)
 Spoke with pharmacy and informed his insurance is requiring Prior Auth again. Patient previously got medication filled using discount card, medication cost was $73. Prior Shara will be started today and will inform patient of update.

## 2024-04-24 NOTE — Patient Instructions (Signed)
 Dwayne Lewis

## 2024-04-30 ENCOUNTER — Telehealth: Payer: Self-pay

## 2024-04-30 ENCOUNTER — Other Ambulatory Visit (HOSPITAL_COMMUNITY): Payer: Self-pay

## 2024-04-30 NOTE — Telephone Encounter (Signed)
 Pharmacy Patient Advocate Encounter  Received notification from [employer] that Prior Authorization for Testosterone  Cypionate 200 MG/ML SOLN  has been DENIED.  Full denial letter will be uploaded to the media tab. See denial reason below.  DENIED BECAUSE OF DRUG EXCLUDED

## 2024-05-02 ENCOUNTER — Ambulatory Visit (INDEPENDENT_AMBULATORY_CARE_PROVIDER_SITE_OTHER): Admitting: Family Medicine

## 2024-05-02 ENCOUNTER — Encounter: Payer: Self-pay | Admitting: Family Medicine

## 2024-05-02 ENCOUNTER — Other Ambulatory Visit: Payer: Self-pay

## 2024-05-02 VITALS — BP 117/73 | HR 75 | Temp 99.3°F | Ht 71.0 in | Wt >= 6400 oz

## 2024-05-02 DIAGNOSIS — I10 Essential (primary) hypertension: Secondary | ICD-10-CM

## 2024-05-02 DIAGNOSIS — E291 Testicular hypofunction: Secondary | ICD-10-CM | POA: Diagnosis not present

## 2024-05-02 DIAGNOSIS — E119 Type 2 diabetes mellitus without complications: Secondary | ICD-10-CM

## 2024-05-02 LAB — POCT GLYCOSYLATED HEMOGLOBIN (HGB A1C)
HbA1c POC (<> result, manual entry): 6.1 % (ref 4.0–5.6)
HbA1c, POC (controlled diabetic range): 6.1 % (ref 0.0–7.0)
HbA1c, POC (prediabetic range): 6.1 % (ref 5.7–6.4)
Hemoglobin A1C: 6.1 % — AB (ref 4.0–5.6)

## 2024-05-02 MED ORDER — TESTOSTERONE CYPIONATE 200 MG/ML IJ SOLN: INTRAMUSCULAR | 1 refills | Status: DC

## 2024-05-02 NOTE — Telephone Encounter (Signed)
 Pt aware medication sent, pharmacy is process of filling rx

## 2024-05-02 NOTE — Progress Notes (Signed)
 OFFICE VISIT  05/02/2024  CC:  Chief Complaint  Patient presents with   Medical Management of Chronic Issues    Patient is a 53 y.o. male who presents for follow-up hypogonadism, diabetes, and hypertension.  INTERIM HX: Dwayne Lewis is feeling well. Since last visit he started testosterone  200 mg IM every 2 weeks.  He says this did significantly improve his energy level and motivation as well as libido. Insurance now says it will not cover it. Most recent inj 04/11/24.  Diet has been good. No home glucose monitoring.  ROS as above, plus--> no fevers, no CP, no SOB, no wheezing, no cough, no dizziness, no HAs, no rashes, no melena/hematochezia.  No polyuria or polydipsia.  No myalgias or arthralgias.  No focal weakness, paresthesias, or tremors.  No acute vision or hearing abnormalities.  No dysuria or unusual/new urinary urgency or frequency.  No recent changes in lower legs. No n/v/d or abd pain.  No palpitations.    Past Medical History:  Diagnosis Date   Adenoma of left adrenal gland    incidental on CT abd 10/2018.   Bell's palsy    L side.  Pain involved (?)--got better with gabapentin .   Diabetes mellitus without complication (HCC)    hx of , no meds   Erosive gastritis 10/2018   EGD   Esophageal stenosis 10/2018   dilation done   High risk sexual behavior    sex with men: started PrEP (descovy ) 08/15/19.   Hyperlipidemia    75), HDL 27, TG 105. LDL goal= <110, no per pt   Hypertension    Morbid obesity (HCC)    BMI>50   NASH (nonalcoholic steatohepatitis)    Rapid weight loss 2020   Purposeful   Seasonal allergies    RAD with RTIs only   Secondary male hypogonadism     Past Surgical History:  Procedure Laterality Date   COLONOSCOPY W/ POLYPECTOMY  10/27/2019   Polyp x 1->submucosal lipoma.  Recall 10 yrs.   ESOPHAGOGASTRODUODENOSCOPY  10/29/2018   LASIK  2000   Bilaterally   TONSILLECTOMY     UPPER GASTROINTESTINAL ENDOSCOPY  2020    Outpatient Medications  Prior to Visit  Medication Sig Dispense Refill   clonazePAM  (KLONOPIN ) 0.5 MG disintegrating tablet Take 1 tablet (0.5 mg total) by mouth 2 (two) times daily. 30 tablet 0   emtricitabine-tenofovir AF (DESCOVY ) 200-25 MG tablet Take 1 tablet by mouth daily. 30 tablet 11   hydrALAZINE  (APRESOLINE ) 25 MG tablet TAKE 1 TABLET BY MOUTH THREE TIMES A DAY 90 tablet 5   hydrochlorothiazide  (HYDRODIURIL ) 25 MG tablet Take 1 tablet (25 mg total) by mouth daily. 15 tablet 0   metoprolol  tartrate (LOPRESSOR ) 50 MG tablet Take 1 tablet (50 mg total) by mouth 2 (two) times daily. 180 tablet 3   pantoprazole  (PROTONIX ) 40 MG tablet TAKE 1 TABLET BY MOUTH DAILY 180 tablet 3   Testosterone  Cypionate 200 MG/ML SOLN 200 mg IM every 2 weeks 6 mL 1   tirzepatide  (MOUNJARO ) 5 MG/0.5ML Pen Inject 5 mg into the skin once a week. (Patient not taking: Reported on 05/02/2024) 6 mL 0   Facility-Administered Medications Prior to Visit  Medication Dose Route Frequency Provider Last Rate Last Admin   testosterone  cypionate (DEPOTESTOSTERONE CYPIONATE) injection 200 mg  200 mg Intramuscular Q14 Days Jacquel Mccamish, Aleene DEL, MD   200 mg at 04/11/24 1340    Allergies  Allergen Reactions   Penicillins     Rash    Sulfonamide  Derivatives     REACTION: rash Tongue swelling   Sulfa Antibiotics Swelling    Tongue swelling   Metformin  And Related Other (See Comments)    Hypoglyc+ GI s/e    Review of Systems As per HPI  PE:    05/02/2024    3:44 PM 01/11/2024    8:59 AM 12/14/2023    1:01 PM  Vitals with BMI  Height 5' 11 5' 11 5' 11  Weight 417 lbs 10 oz 394 lbs 13 oz 402 lbs 6 oz  BMI 58.27 55.09 56.15  Systolic 117 105 898  Diastolic 73 70 65  Pulse 75 78 98     Physical Exam  Gen: Alert, well appearing.  Patient is oriented to person, place, time, and situation. AFFECT: pleasant, lucid thought and speech. Extremities: No pitting edema  LABS:  Last CBC Lab Results  Component Value Date   WBC 7.2  05/31/2023   HGB 13.5 05/31/2023   HCT 42.5 05/31/2023   MCV 83.4 05/31/2023   MCH 27.2 08/25/2022   RDW 15.9 (H) 05/31/2023   PLT 194.0 05/31/2023   Last metabolic panel Lab Results  Component Value Date   GLUCOSE 117 (H) 11/30/2023   NA 140 11/30/2023   K 3.7 11/30/2023   CL 97 11/30/2023   CO2 35 (H) 11/30/2023   BUN 10 11/30/2023   CREATININE 0.72 11/30/2023   GFR 103.17 11/30/2023   CALCIUM 9.3 11/30/2023   PROT 6.9 11/30/2023   ALBUMIN 4.2 11/30/2023   BILITOT 1.0 11/30/2023   ALKPHOS 67 11/30/2023   AST 25 11/30/2023   ALT 25 11/30/2023   ANIONGAP 7 08/25/2022   Last lipids Lab Results  Component Value Date   CHOL 147 11/30/2023   HDL 35.10 (L) 11/30/2023   LDLCALC 88 11/30/2023   LDLDIRECT 92.0 03/30/2015   TRIG 122.0 11/30/2023   CHOLHDL 4 11/30/2023   Last hemoglobin A1c Lab Results  Component Value Date   HGBA1C 6.1 (A) 05/02/2024   HGBA1C 6.1 05/02/2024   HGBA1C 6.1 05/02/2024   HGBA1C 6.1 05/02/2024   Last thyroid  functions Lab Results  Component Value Date   TSH 1.71 05/31/2023   T4TOTAL 6.7 06/10/2021   Last vitamin D  Lab Results  Component Value Date   VD25OH 25.49 (L) 03/16/2022   Lab Results  Component Value Date   TESTOSTERONE  827.35 01/25/2024   IMPRESSION AND PLAN:  #1 diabetes without complication. Good control with diet only. Hemoglobin A1c today is 6.1%.  2.  Hypertension, well-controlled on Lopressor  50 mg twice a day, HCTZ 25 mg a day, and hydralazine  25 mg 3 times a day. Electrolytes and creatinine monitoring today.  3.  Secondary male hypogonadism. Will get him restarted on testosterone  200 mg IM every 2 weeks.  He says he will pay for this out-of-pocket.  New prescription sent today. Monitor hemoglobin.  An After Visit Summary was printed and given to the patient.  FOLLOW UP: No follow-ups on file. Next CPE 05/2024 Signed:  Gerlene Hockey, MD           05/02/2024

## 2024-05-02 NOTE — Telephone Encounter (Signed)
 Dwayne Lewis, [place] E   05/01/2024  3:25 PM  Patient is calling to see why his authorization was denied. I let him know that it was denied due to the drug being excluded. The patient is willing to pay fully out of pocket and would like if Dr. Candise could send his medication but to let the pharmacy know not to run it through insurance    Please fill, if appropriate.

## 2024-05-03 LAB — BASIC METABOLIC PANEL WITH GFR
BUN: 16 mg/dL (ref 7–25)
CO2: 25 mmol/L (ref 20–32)
Calcium: 8.8 mg/dL (ref 8.6–10.3)
Chloride: 102 mmol/L (ref 98–110)
Creat: 0.92 mg/dL (ref 0.70–1.30)
Glucose, Bld: 111 mg/dL — ABNORMAL HIGH (ref 65–99)
Potassium: 4.6 mmol/L (ref 3.5–5.3)
Sodium: 139 mmol/L (ref 135–146)
eGFR: 98 mL/min/1.73m2 (ref >=60)

## 2024-05-05 ENCOUNTER — Ambulatory Visit: Payer: Self-pay | Admitting: Family Medicine

## 2024-05-16 ENCOUNTER — Ambulatory Visit

## 2024-05-16 DIAGNOSIS — E291 Testicular hypofunction: Secondary | ICD-10-CM

## 2024-05-16 MED ORDER — TESTOSTERONE CYPIONATE 200 MG/ML IM SOLN
200.0000 mg | Freq: Once | INTRAMUSCULAR | Status: AC
  Administered 2024-05-16: 200 mg via INTRAMUSCULAR

## 2024-05-16 NOTE — Progress Notes (Signed)
 Pt here for biweekly testosterone  injection per Dr McGowen  Injection tolerated well.   Pts last lab was 01/25/24  Next injection not yet scheduled. But due in 2 weeks. Pt does need refill sent to pharmacy

## 2024-05-30 ENCOUNTER — Ambulatory Visit

## 2024-05-30 ENCOUNTER — Ambulatory Visit (INDEPENDENT_AMBULATORY_CARE_PROVIDER_SITE_OTHER)

## 2024-05-30 DIAGNOSIS — E291 Testicular hypofunction: Secondary | ICD-10-CM

## 2024-05-30 MED ORDER — TESTOSTERONE CYPIONATE 200 MG/ML IM SOLN
200.0000 mg | Freq: Once | INTRAMUSCULAR | Status: AC
  Administered 2024-05-30: 200 mg via INTRAMUSCULAR

## 2024-05-30 NOTE — Progress Notes (Signed)
 Pt here for biweekly testosterone  injection per Dr McGowen   Injection tolerated well.   Pts last lab was 01/25/24   Next injection not yet scheduled. But due in 2 weeks.

## 2024-06-13 ENCOUNTER — Ambulatory Visit (INDEPENDENT_AMBULATORY_CARE_PROVIDER_SITE_OTHER)

## 2024-06-13 DIAGNOSIS — E291 Testicular hypofunction: Secondary | ICD-10-CM | POA: Diagnosis not present

## 2024-06-13 MED ORDER — TESTOSTERONE CYPIONATE 200 MG/ML IM SOLN
200.0000 mg | INTRAMUSCULAR | Status: AC
  Administered 2024-06-13 – 2024-07-25 (×4): 200 mg via INTRAMUSCULAR

## 2024-06-13 NOTE — Progress Notes (Signed)
 Pt here for biweekly testosterone injection per Dr Milinda Cave   Injection tolerated well.   Next injection scheduled for 2 weeks

## 2024-06-18 ENCOUNTER — Other Ambulatory Visit: Payer: Self-pay

## 2024-06-18 DIAGNOSIS — Z79899 Other long term (current) drug therapy: Secondary | ICD-10-CM

## 2024-06-18 MED ORDER — DESCOVY 200-25 MG PO TABS
1.0000 | ORAL_TABLET | Freq: Every day | ORAL | 11 refills | Status: DC
Start: 2024-06-18 — End: 2024-07-28

## 2024-06-19 ENCOUNTER — Other Ambulatory Visit (HOSPITAL_COMMUNITY): Payer: Self-pay

## 2024-06-19 ENCOUNTER — Telehealth: Payer: Self-pay

## 2024-06-19 NOTE — Telephone Encounter (Signed)
 Pharmacy Patient Advocate Encounter  Insurance verification completed.   The patient is insured through [employer] Non-Formulary.   Ran test claim for Descovy  200-25MG  tablets. The current No PA needed at this time. MZQ#:[id number] TOO SOON, NEXT FILL AVAILABLE ON 06/20/2024 12:00:00 AM. FILLED AT [facility] ON 05/29/2024 12:00:00 AM. This test claim was processed through [facility]- copay amounts may vary at other pharmacies due to pharmacy/plan contracts, or as the patient moves through the different stages of their insurance plan.

## 2024-06-19 NOTE — Telephone Encounter (Signed)
 FYI reviewed. No further action needed

## 2024-06-27 ENCOUNTER — Ambulatory Visit

## 2024-06-27 DIAGNOSIS — E291 Testicular hypofunction: Secondary | ICD-10-CM | POA: Diagnosis not present

## 2024-06-27 NOTE — Progress Notes (Signed)
 Pt here for biweekly testosterone injection per Dr Milinda Cave   Injection tolerated well.   Next injection scheduled for 2 weeks

## 2024-06-30 ENCOUNTER — Other Ambulatory Visit: Payer: Self-pay

## 2024-06-30 DIAGNOSIS — Z79899 Other long term (current) drug therapy: Secondary | ICD-10-CM

## 2024-07-11 ENCOUNTER — Ambulatory Visit (INDEPENDENT_AMBULATORY_CARE_PROVIDER_SITE_OTHER)

## 2024-07-11 DIAGNOSIS — E291 Testicular hypofunction: Secondary | ICD-10-CM | POA: Diagnosis not present

## 2024-07-11 NOTE — Progress Notes (Signed)
 Pt here for biweekly testosterone injection per Dr Milinda Cave   Injection tolerated well.   Next injection scheduled for 2 weeks

## 2024-07-17 ENCOUNTER — Ambulatory Visit: Payer: Self-pay

## 2024-07-17 ENCOUNTER — Ambulatory Visit: Admitting: Sports Medicine

## 2024-07-17 NOTE — Telephone Encounter (Signed)
 FYI Only or Action Required?: FYI only for provider: Patient states he is going to go to Urgent Care after work.  Patient was last seen in primary care on 05/02/2024 by McGowen, Aleene DEL, MD.  Called Nurse Triage reporting Migraine.  Symptoms began 2 weeks ago.  Interventions attempted: OTC medications: Tylenol  and Rest, hydration, or home remedies.  Symptoms are: gradually worsening.  Triage Disposition: See HCP Within 4 Hours (Or PCP Triage)  Patient/caregiver understands and will follow disposition?: Yes, but will wait   ---patient states he cannot leave work but he is going to go to Urgent Care after work              Copied from [facility] #[id number]. Topic: Clinical - Red Word Triage >> Jul 17, 2024  9:23 AM Rosina BIRCH wrote: Reason for RMF:[id number],wjldzj for two weeks Reason for Disposition  [1] SEVERE headache (e.g., excruciating) AND [2] not improved after 2 hours of pain medicine  Answer Assessment - Initial Assessment Questions Patient states he used to have migraines a long time ago Started back last year Normally Tylenol  helped ---not helping now Constant No injuries, fever, numbness, weakness, vision changes, trouble walking, trouble talking Patient advised the recommendation is to be seen within the next 4 hours but patient states he cannot leave work but he will go to Urgent Care after work  Patient is advised to call us  back if anything changes or with any further questions/concerns. Patient is advised that if anything worsens to go to the Emergency Room. Patient verbalized understanding.       1. LOCATION: Where does it hurt?      Back and straight to the front 2. ONSET: When did the headache start? (e.g., minutes, hours, days)      2 weeks ago 3. PATTERN: Does the pain come and go, or has it been constant since it started?     constant 4. SEVERITY: How bad is the pain? and What does it keep you from doing?  (e.g., Scale 1-10; mild, moderate,  or severe)     8 5. RECURRENT SYMPTOM: Have you ever had headaches before? If Yes, ask: When was the last time? and What happened that time?      ------ 6. CAUSE: What do you think is causing the headache?     ---- 7. MIGRAINE: Have you been diagnosed with migraine headaches? If Yes, ask: Is this headache similar?      ---- 8. HEAD INJURY: Has there been any recent injury to your head?      no 9. OTHER SYMPTOMS: Do you have any other symptoms? (e.g., fever, stiff neck, eye pain, sore throat, cold symptoms)     no  Protocols used: Headache-A-AH

## 2024-07-17 NOTE — Telephone Encounter (Signed)
 FYI has been reviewed.

## 2024-07-25 ENCOUNTER — Ambulatory Visit

## 2024-07-25 DIAGNOSIS — E291 Testicular hypofunction: Secondary | ICD-10-CM | POA: Diagnosis not present

## 2024-07-25 NOTE — Progress Notes (Signed)
 Pt here for biweekly testosterone injection per Dr Milinda Cave   Injection tolerated well.   Next injection scheduled for 2 weeks

## 2024-07-28 ENCOUNTER — Other Ambulatory Visit: Payer: Self-pay

## 2024-07-28 DIAGNOSIS — Z79899 Other long term (current) drug therapy: Secondary | ICD-10-CM

## 2024-07-28 MED ORDER — DESCOVY 200-25 MG PO TABS: 1.0000 | ORAL_TABLET | Freq: Every day | ORAL | 1 refills | Status: DC

## 2024-07-28 MED ORDER — DESCOVY 200-25 MG PO TABS: 1.0000 | ORAL_TABLET | Freq: Every day | ORAL | 1 refills | Status: AC

## 2024-07-28 NOTE — Addendum Note (Signed)
 Addended by: CLAUDENE SHANDA ORN on: 07/28/2024 02:00 PM   Modules accepted: Orders

## 2024-08-01 ENCOUNTER — Ambulatory Visit (INDEPENDENT_AMBULATORY_CARE_PROVIDER_SITE_OTHER): Admitting: Family Medicine

## 2024-08-01 ENCOUNTER — Encounter: Payer: Self-pay | Admitting: Family Medicine

## 2024-08-01 VITALS — BP 128/76 | HR 77 | Temp 98.2°F | Ht 71.0 in | Wt >= 6400 oz

## 2024-08-01 DIAGNOSIS — Z7984 Long term (current) use of oral hypoglycemic drugs: Secondary | ICD-10-CM

## 2024-08-01 DIAGNOSIS — G4733 Obstructive sleep apnea (adult) (pediatric): Secondary | ICD-10-CM

## 2024-08-01 DIAGNOSIS — I10 Essential (primary) hypertension: Secondary | ICD-10-CM | POA: Diagnosis not present

## 2024-08-01 DIAGNOSIS — Z23 Encounter for immunization: Secondary | ICD-10-CM | POA: Diagnosis not present

## 2024-08-01 DIAGNOSIS — E291 Testicular hypofunction: Secondary | ICD-10-CM | POA: Diagnosis not present

## 2024-08-01 DIAGNOSIS — E119 Type 2 diabetes mellitus without complications: Secondary | ICD-10-CM

## 2024-08-01 DIAGNOSIS — Z0000 Encounter for general adult medical examination without abnormal findings: Secondary | ICD-10-CM

## 2024-08-01 DIAGNOSIS — Z125 Encounter for screening for malignant neoplasm of prostate: Secondary | ICD-10-CM | POA: Diagnosis not present

## 2024-08-01 DIAGNOSIS — G4719 Other hypersomnia: Secondary | ICD-10-CM

## 2024-08-01 LAB — CBC WITH DIFFERENTIAL/PLATELET
Basophils Absolute: 0 K/uL (ref 0.0–0.1)
Basophils Relative: 0.6 % (ref 0.0–3.0)
Eosinophils Absolute: 0.1 K/uL (ref 0.0–0.7)
Eosinophils Relative: 1.3 % (ref 0.0–5.0)
HCT: 40.1 % (ref 39.0–52.0)
Hemoglobin: 12.6 g/dL — ABNORMAL LOW (ref 13.0–17.0)
Lymphocytes Relative: 20.5 % (ref 12.0–46.0)
Lymphs Abs: 1.6 K/uL (ref 0.7–4.0)
MCHC: 31.3 g/dL (ref 30.0–36.0)
MCV: 73.8 fl — ABNORMAL LOW (ref 78.0–100.0)
Monocytes Absolute: 0.6 K/uL (ref 0.1–1.0)
Monocytes Relative: 8.3 % (ref 3.0–12.0)
Neutro Abs: 5.4 K/uL (ref 1.4–7.7)
Neutrophils Relative %: 69.3 % (ref 43.0–77.0)
Platelets: 160 K/uL (ref 150.0–400.0)
RBC: 5.44 Mil/uL (ref 4.22–5.81)
RDW: 17 % — ABNORMAL HIGH (ref 11.5–15.5)
WBC: 7.7 K/uL (ref 4.0–10.5)

## 2024-08-01 LAB — PSA: PSA: 0.86 ng/mL (ref 0.10–4.00)

## 2024-08-01 LAB — LIPID PANEL
Cholesterol: 143 mg/dL (ref 0–200)
HDL: 34.3 mg/dL — ABNORMAL LOW (ref >39.00)
LDL Cholesterol: 85 mg/dL (ref 0–99)
NonHDL: 109.13
Total CHOL/HDL Ratio: 4
Triglycerides: 123 mg/dL (ref 0.0–149.0)
VLDL: 24.6 mg/dL (ref 0.0–40.0)

## 2024-08-01 LAB — COMPREHENSIVE METABOLIC PANEL WITH GFR
ALT: 17 U/L (ref 0–53)
AST: 23 U/L (ref 0–37)
Albumin: 4.3 g/dL (ref 3.5–5.2)
Alkaline Phosphatase: 66 U/L (ref 39–117)
BUN: 9 mg/dL (ref 6–23)
CO2: 34 meq/L — ABNORMAL HIGH (ref 19–32)
Calcium: 8.7 mg/dL (ref 8.4–10.5)
Chloride: 97 meq/L (ref 96–112)
Creatinine, Ser: 0.86 mg/dL (ref 0.40–1.50)
GFR: 97.32 mL/min (ref >60.00)
Glucose, Bld: 107 mg/dL — ABNORMAL HIGH (ref 70–99)
Potassium: 4.4 meq/L (ref 3.5–5.1)
Sodium: 137 meq/L (ref 135–145)
Total Bilirubin: 1.2 mg/dL (ref 0.2–1.2)
Total Protein: 7.1 g/dL (ref 6.0–8.3)

## 2024-08-01 LAB — TSH: TSH: 1.81 u[IU]/mL (ref 0.35–5.50)

## 2024-08-01 LAB — TESTOSTERONE: Testosterone: 556.16 ng/dL (ref 300.00–890.00)

## 2024-08-01 LAB — POCT GLYCOSYLATED HEMOGLOBIN (HGB A1C)
HbA1c POC (<> result, manual entry): 6.6 % (ref 4.0–5.6)
HbA1c, POC (controlled diabetic range): 6.6 % (ref 0.0–7.0)
HbA1c, POC (prediabetic range): 6.6 % — AB (ref 5.7–6.4)
Hemoglobin A1C: 6.6 % — AB (ref 4.0–5.6)

## 2024-08-01 MED ORDER — TIRZEPATIDE 5 MG/0.5ML ~~LOC~~ SOAJ: 5.0000 mg | SUBCUTANEOUS | 1 refills | Status: DC

## 2024-08-01 NOTE — Progress Notes (Signed)
 Office Note 08/01/2024  CC:  Chief Complaint  Patient presents with   Hypertension   Patient is a 53 y.o. male who is here for annual health maintenance exam and 36-month follow-up hypogonadism, diabetes, and hypertension. A/P as of last visit: 1 diabetes without complication. Good control with diet only. Hemoglobin A1c today is 6.1%.   2.  Hypertension, well-controlled on Lopressor  50 mg twice a day, HCTZ 25 mg a day, and hydralazine  25 mg 3 times a day. Electrolytes and creatinine monitoring today.   3.  Secondary male hypogonadism. Will get him restarted on testosterone  200 mg IM every 2 weeks.  He says he will pay for this out-of-pocket.  New prescription sent today. Monitor hemoglobin.  INTERIM HX: Dwayne Lewis feels well other than chronic fatigue.  He gets about 8 hours of sleep every night yet he feels like he got none.  Could fall asleep at any time during the day if he was given the opportunity. He snores.  No known witnessed apneic events in sleep.  He was on tirzepatide  2.5 mg at 1 point in time and tolerated it well.  Insurance issues occurred and he has not been on it recently but wants to restart.     PMP AWARE reviewed today: most recent rx for testosterone  was filled 05/02/2024, # 6 mL, rx by me. Most recent clonazepam  rx filled 07/17/23, #90, rx by me. No red flags.   Past Medical History:  Diagnosis Date   Adenoma of left adrenal gland    incidental on CT abd 10/2018.   Bell's palsy    L side.  Pain involved (?)--got better with gabapentin .   Diabetes mellitus without complication (HCC)    hx of , no meds   Erosive gastritis 10/2018   EGD   Esophageal stenosis 10/2018   dilation done   High risk sexual behavior    sex with men: started PrEP (descovy ) 08/15/19.   Hyperlipidemia    75), HDL 27, TG 105. LDL goal= <110, no per pt   Hypertension    Morbid obesity (HCC)    BMI>50   NASH (nonalcoholic steatohepatitis)    Rapid weight loss 2020    Purposeful   Seasonal allergies    RAD with RTIs only   Secondary male hypogonadism     Past Surgical History:  Procedure Laterality Date   COLONOSCOPY W/ POLYPECTOMY  10/27/2019   Polyp x 1->submucosal Lewis.  Recall 10 yrs.   ESOPHAGOGASTRODUODENOSCOPY  10/29/2018   2020.  Dysphagia 2025--> no stricture.  1 fundal polyp.  Otherwise normal.  Empiric dilatation done.  No help.   LASIK  2000   Bilaterally   TONSILLECTOMY      Family History  Problem Relation Age of Onset   Hypertension Mother    Lung cancer Maternal Grandmother    COPD Maternal Grandmother        uncle    Heart failure Maternal Grandfather    Transient ischemic attack Maternal Grandfather    Hearing loss Maternal Grandfather    Stroke Maternal Grandfather    Hypertension Other    Diabetes Other        MG aunt   Colon cancer Neg Hx    Prostate cancer Neg Hx    CAD Neg Hx    Esophageal cancer Neg Hx    Stomach cancer Neg Hx    Rectal cancer Neg Hx    Pancreatic cancer Neg Hx     Social History   Socioeconomic History  Marital status: Single    Spouse name: Not on file   Number of children: 0   Years of education: Not on file   Highest education level: 12th grade  Occupational History   Occupation: works for a bank Part time   Tobacco Use   Smoking status: Never   Smokeless tobacco: Never  Vaping Use   Vaping status: Never Used  Substance and Sexual Activity   Alcohol use: Not Currently    Alcohol/week: 0.0 standard drinks of alcohol   Drug use: No   Sexual activity: Yes    Partners: Male  Other Topics Concern   Not on file  Social History Narrative   Single, no children.   Orig from [place].   Occup: works part time for [employer].   No tobacco.   No alcohol.      Lives w/ mother to help her out (mother is Dwayne Lewis)   Social Drivers of Health   Financial Resource Strain: Low Risk  (08/30/2023)   Overall Financial Resource Strain (CARDIA)    Difficulty  of Paying Living Expenses: Not hard at all  Food Insecurity: No Food Insecurity (08/30/2023)   Hunger Vital Sign    Worried About Running Out of Food in the Last Year: Never true    Ran Out of Food in the Last Year: Never true  Transportation Needs: No Transportation Needs (08/30/2023)   PRAPARE - Administrator, Civil Service (Medical): No    Lack of Transportation (Non-Medical): No  Physical Activity: Unknown (08/30/2023)   Exercise Vital Sign    Days of Exercise per Week: 0 days    Minutes of Exercise per Session: Not on file  Stress: No Stress Concern Present (03/21/2024)   Received from [facility] Occupational Health - Occupational Stress Questionnaire    Feeling of Stress : Only a little  Social Connections: Unknown (08/30/2023)   Social Connection and Isolation Panel    Frequency of Communication with Friends and Family: Once a week    Frequency of Social Gatherings with Friends and Family: Once a week    Attends Religious Services: Patient declined    Database Administrator or Organizations: No    Attends Engineer, Structural: Not on file    Marital Status: Patient declined  Recent Concern: Social Connections - Socially Isolated (07/17/2023)   Social Connection and Isolation Panel    Frequency of Communication with Friends and Family: Once a week    Frequency of Social Gatherings with Friends and Family: Once a week    Attends Religious Services: Never    Database Administrator or Organizations: No    Attends Engineer, Structural: Not on file    Marital Status: Never married  Intimate Partner Violence: Not At Risk (03/21/2024)   Received from [facility]   HITS    Over the last 12 months how often did your partner scream or curse at you?: Never    Over the last 12 months how often did your partner physically hurt you?: Never    Over the last 12 months how often did your partner insult you or talk down to you?: Never     Over the last 12 months how often did your partner threaten you with physical harm?: Never    Outpatient Medications Prior to Visit  Medication Sig Dispense Refill   emtricitabine-tenofovir AF (DESCOVY ) 200-25 MG tablet Take 1 tablet by  mouth daily. 90 tablet 1   hydrALAZINE  (APRESOLINE ) 25 MG tablet TAKE 1 TABLET BY MOUTH THREE TIMES A DAY 90 tablet 5   hydrochlorothiazide  (HYDRODIURIL ) 25 MG tablet Take 1 tablet (25 mg total) by mouth daily. 15 tablet 0   metoprolol  tartrate (LOPRESSOR ) 50 MG tablet Take 1 tablet (50 mg total) by mouth 2 (two) times daily. 180 tablet 3   pantoprazole  (PROTONIX ) 40 MG tablet TAKE 1 TABLET BY MOUTH DAILY 180 tablet 3   Testosterone  Cypionate 200 MG/ML SOLN 200 mg IM every 2 weeks 6 mL 1   clonazePAM  (KLONOPIN ) 0.5 MG disintegrating tablet Take 1 tablet (0.5 mg total) by mouth 2 (two) times daily. 30 tablet 0   Facility-Administered Medications Prior to Visit  Medication Dose Route Frequency Provider Last Rate Last Admin   testosterone  cypionate (DEPOTESTOSTERONE CYPIONATE) injection 200 mg  200 mg Intramuscular Q14 Days Kenny Rea, Aleene DEL, MD   200 mg at 07/25/24 9140    Allergies  Allergen Reactions   Penicillins     Rash    Sulfonamide Derivatives     REACTION: rash Tongue swelling   Sulfa Antibiotics Swelling    Tongue swelling   Metformin  And Related Other (See Comments)    Hypoglyc+ GI s/e    Review of Systems  Constitutional:  Positive for fatigue. Negative for appetite change, chills and fever.       Hot flashes, 5 episodes in the last 2 mo  HENT:  Negative for congestion, dental problem, ear pain and sore throat.   Eyes:  Negative for discharge, redness and visual disturbance.  Respiratory:  Negative for cough, chest tightness, shortness of breath and wheezing.   Cardiovascular:  Negative for chest pain, palpitations and leg swelling.  Gastrointestinal:  Negative for abdominal pain, blood in stool, diarrhea, nausea and vomiting.   Genitourinary:  Negative for difficulty urinating, dysuria, flank pain, frequency, hematuria and urgency.  Musculoskeletal:  Negative for arthralgias, back pain, joint swelling, myalgias and neck stiffness.  Skin:  Negative for pallor and rash.  Neurological:  Negative for dizziness, speech difficulty, weakness and headaches.  Hematological:  Negative for adenopathy. Does not bruise/bleed easily.  Psychiatric/Behavioral:  Negative for confusion and sleep disturbance. The patient is not nervous/anxious.    PE;    08/01/2024    9:43 AM 05/02/2024    3:44 PM 01/11/2024    8:59 AM  Vitals with BMI  Height 5' 11 5' 11 5' 11  Weight 413 lbs 3 oz 417 lbs 10 oz 394 lbs 13 oz  BMI 57.66 58.27 55.09  Systolic 128 117 894  Diastolic 76 73 70  Pulse 77 75 78   Gen: Alert, well appearing.  Patient is oriented to person, place, time, and situation. AFFECT: pleasant, lucid thought and speech. ENT: Ears: EACs clear, normal epithelium.  TMs with good light reflex and landmarks bilaterally.  Eyes: no injection, icteris, swelling, or exudate.  EOMI, PERRLA. Nose: no drainage or turbinate edema/swelling.  No injection or focal lesion.  Mouth: lips without lesion/swelling.  Oral mucosa pink and moist.  Dentition intact and without obvious caries or gingival swelling.  Oropharynx without erythema, exudate, or swelling.  Neck: supple/nontender.  No LAD, mass, or TM.  Carotid pulses 2+ bilaterally, without bruits. CV: RRR, no m/r/g.   LUNGS: CTA bilat, nonlabored resps, good aeration in all lung fields. ABD: soft, NT, ND. EXT: no clubbing, cyanosis, or edema.  Musculoskeletal: no joint swelling, erythema, warmth, or tenderness.  ROM of all  joints intact. Skin - no sores or suspicious lesions or rashes or color changes  Pertinent labs:  Lab Results  Component Value Date   HGBA1C 6.6 (A) 08/01/2024   HGBA1C 6.6 08/01/2024   HGBA1C 6.6 (A) 08/01/2024   HGBA1C 6.6 08/01/2024   Lab Results  Component  Value Date   WBC 7.2 05/31/2023   HGB 13.5 05/31/2023   HCT 42.5 05/31/2023   MCV 83.4 05/31/2023   PLT 194.0 05/31/2023     Chemistry      Component Value Date/Time   NA 139 05/02/2024 1625   NA 138 10/20/2022 1113   K 4.6 05/02/2024 1625   CL 102 05/02/2024 1625   CO2 25 05/02/2024 1625   BUN 16 05/02/2024 1625   BUN 16 10/20/2022 1113   CREATININE 0.92 05/02/2024 1625   GLU 105 10/31/2018 0000      Component Value Date/Time   CALCIUM 8.8 05/02/2024 1625   ALKPHOS 67 11/30/2023 0925   AST 25 11/30/2023 0925   ALT 25 11/30/2023 0925   BILITOT 1.0 11/30/2023 0925     Lab Results  Component Value Date   CHOL 147 11/30/2023   HDL 35.10 (L) 11/30/2023   LDLCALC 88 11/30/2023   LDLDIRECT 92.0 03/30/2015   TRIG 122.0 11/30/2023   CHOLHDL 4 11/30/2023   Lab Results  Component Value Date   TSH 1.71 05/31/2023   Lab Results  Component Value Date   TESTOSTERONE  827.35 01/25/2024   ASSESSMENT AND PLAN:   1 health maintenance exam: Reviewed age and gender appropriate health maintenance issues (prudent diet, regular exercise, health risks of tobacco and excessive alcohol, use of seatbelts, fire alarms in home, use of sunscreen).  Also reviewed age and gender appropriate health screening as well as vaccine recommendations. Vaccines:   Prevnar 20->declined. Labs:HP, A1c Prostate ca screening: PSA today Colon ca screening: recall 2031  2 diabetes without complication. Hemoglobin A1c today is 6.6%. We are going to see if he can get tirzepatide  again.  We gave him samples for 1 month of the 2.5 mg weekly dose and I did printed prescription for the 5 mg weekly dose.  3.  Hypertension, well-controlled on Lopressor  50 mg twice a day, HCTZ 25 mg a day, and hydralazine  25 mg 3 times a day. Electrolytes and creatinine monitoring today.   4.  Secondary male hypogonadism. He is really not sure if this testosterone  has helped him in the last few months. He wants to stay on it for  now.  Continue 200 mg IM every 2 weeks. His last injection was 1 week ago--> check testosterone  level today. Monitor hemoglobin today.  #5 excessive daytime sleepiness. Obstructive sleep apnea suspected. Refer to neurology/sleep MD for further evaluation.  An After Visit Summary was printed and given to the patient.  FOLLOW UP:  Return in about 3 months (around 11/01/2024) for routine chronic illness f/u.  Signed:  Gerlene Hockey, MD           08/01/2024

## 2024-08-03 ENCOUNTER — Encounter: Payer: Self-pay | Admitting: Family Medicine

## 2024-08-03 ENCOUNTER — Ambulatory Visit: Payer: Self-pay | Admitting: Family Medicine

## 2024-08-03 DIAGNOSIS — D509 Iron deficiency anemia, unspecified: Secondary | ICD-10-CM

## 2024-08-04 ENCOUNTER — Encounter: Payer: Self-pay | Admitting: Family Medicine

## 2024-08-04 ENCOUNTER — Ambulatory Visit (INDEPENDENT_AMBULATORY_CARE_PROVIDER_SITE_OTHER)

## 2024-08-04 DIAGNOSIS — D509 Iron deficiency anemia, unspecified: Secondary | ICD-10-CM

## 2024-08-04 LAB — IBC + FERRITIN
Ferritin: 8.9 ng/mL — ABNORMAL LOW (ref 22.0–322.0)
Iron: 36 ug/dL — ABNORMAL LOW (ref 42–165)
Saturation Ratios: 7.2 % — ABNORMAL LOW (ref 20.0–50.0)
TIBC: 497 ug/dL — ABNORMAL HIGH (ref 250.0–450.0)
Transferrin: 355 mg/dL (ref 212.0–360.0)

## 2024-08-05 ENCOUNTER — Encounter: Payer: Self-pay | Admitting: Family Medicine

## 2024-08-05 ENCOUNTER — Telehealth: Payer: Self-pay | Admitting: Pharmacy Technician

## 2024-08-05 ENCOUNTER — Other Ambulatory Visit (HOSPITAL_COMMUNITY): Payer: Self-pay

## 2024-08-05 NOTE — Telephone Encounter (Signed)
 Pharmacy Patient Advocate Encounter   Received notification from Onbase that prior authorization for Mounjaro  5MG /0.5ML auto-injectors  is required/requested.   Insurance verification completed.   The patient is insured through [place] [employer] [place].   Per test claim: PA required; PA submitted to above mentioned insurance via Fax Key/confirmation #/EOC [id number] Status is pending

## 2024-08-05 NOTE — Telephone Encounter (Signed)
 No further action needed at this time.

## 2024-08-05 NOTE — Telephone Encounter (Signed)
 Future orders entered. Routed to PCP in error

## 2024-08-05 NOTE — Telephone Encounter (Signed)
 Hi Aydenn, You have slight anemia (low hemoglobin, low MCV, and elevated RDW) and this is being caused by severe iron deficiency. Lets do home Hemoccult testing x 3 to see if there is any blood loss coming from your intestine. Start over-the-counter ferrous sulfate 325 mg, 1 tab daily. Repeat iron panel and CBC in 1 month.  You have a couple of additional slight abnormalities on your blood work that are of no clinical significance (CO2, glucose, HDL).   Millicent, will you order these things?  Diagnosis iron deficiency anemia.

## 2024-08-06 NOTE — Telephone Encounter (Signed)
 Pharmacy Patient Advocate Encounter  Received notification from [facility]. that Prior Authorization for Mounjaro  5MG /0.5ML auto-injectors  has been APPROVED from 08/05/2024 to 08/10/2025   PA #/Case ID/Reference #: LETTER OF APPROVAL IS IN MEDIA OF CHART

## 2024-08-11 ENCOUNTER — Encounter: Payer: Self-pay | Admitting: Sports Medicine

## 2024-08-11 ENCOUNTER — Ambulatory Visit: Admitting: Sports Medicine

## 2024-08-11 VITALS — BP 138/79 | HR 100 | Temp 98.9°F | Wt >= 6400 oz

## 2024-08-11 DIAGNOSIS — J0100 Acute maxillary sinusitis, unspecified: Secondary | ICD-10-CM

## 2024-08-11 DIAGNOSIS — K0889 Other specified disorders of teeth and supporting structures: Secondary | ICD-10-CM

## 2024-08-11 DIAGNOSIS — R051 Acute cough: Secondary | ICD-10-CM

## 2024-08-11 DIAGNOSIS — I10 Essential (primary) hypertension: Secondary | ICD-10-CM

## 2024-08-11 LAB — POC COVID19 BINAXNOW: SARS Coronavirus 2 Ag: NEGATIVE

## 2024-08-11 LAB — POCT INFLUENZA A/B
Influenza A, POC: NEGATIVE
Influenza B, POC: NEGATIVE

## 2024-08-11 MED ORDER — BENZONATATE 200 MG PO CAPS: 200.0000 mg | ORAL_CAPSULE | Freq: Two times a day (BID) | ORAL | 0 refills | Status: DC | PRN

## 2024-08-11 MED ORDER — DOXYCYCLINE HYCLATE 100 MG PO TABS: 100.0000 mg | ORAL_TABLET | Freq: Two times a day (BID) | ORAL | 0 refills | Status: DC

## 2024-08-11 NOTE — Progress Notes (Signed)
 Careteam: Patient Care Team: Candise Aleene DEL, MD as PCP - General (Family Medicine) Nahser, Aleene PARAS, MD (Inactive) as PCP - Cardiology (Cardiology) Cleatus Collar, MD as Consulting Physician (Ophthalmology) Camillo Golas, MD as Consulting Physician (Ophthalmology) Charley Cid, MD as Consulting Physician (Gastroenterology) Skeet Juliene SAUNDERS, DO as Consulting Physician (Neurology) Aneita Gwendlyn DASEN, MD (Inactive) as Consulting Physician (Gastroenterology)  Allergies  Allergen Reactions   Penicillins     Rash    Sulfonamide Derivatives     REACTION: rash Tongue swelling   Sulfa Antibiotics Swelling    Tongue swelling   Metformin  And Related Other (See Comments)    Hypoglyc+ GI s/e    Chief Complaint  Patient presents with   Cough    Pt has been having facial pain and cough since Thursday. He has not tested for flu or COVID.    Discussed the use of AI scribe software for clinical note transcription with the patient, who gave verbal consent to proceed.  History of Present Illness  Dwayne Lewis is a 53 year old male who presents with rt sided facial pain and swelling.  He woke up on Thursday morning with a toothache. He is scheduled to see the dentist at 11 AM today. Yesterday, he began feeling unwell with symptoms spreading across his face and throat pain.  He describes a dry cough that started last night. No fever, dizziness, lightheadedness, ear pain, or drainage from the ears. He experiences rt sided  facial pain and frontal headache, particularly from the tooth area, and has difficulty chewing, limiting his diet to soft foods like mashed potatoes.  He has a headache extending from his forehead to his eyes, which are also watering. He reports a pre-existing condition of Bell's palsy.  He experiences nasal congestion, making it difficult to breathe through his nose, but denies any phlegm production with his cough. He has not taken any over-the-counter medication for  the cough.  No body aches, muscle cramps, abdominal pain, nausea, vomiting, diarrhea, double vision, or blurred vision. He reports sneezing and coughing but no recent exposure to sick individuals.  His current medications include hydrochlorothiazide , metoprolol , Descovy , Protonix , and over-the-counter iron pills.     Review of Systems:  Review of Systems  Constitutional:  Positive for malaise/fatigue. Negative for chills and fever.  HENT:  Positive for congestion and sinus pain. Negative for ear pain and sore throat.   Eyes:  Negative for blurred vision, photophobia and discharge.  Respiratory:  Positive for cough. Negative for sputum production, shortness of breath and wheezing.   Cardiovascular:  Negative for chest pain, palpitations and leg swelling.  Gastrointestinal:  Negative for abdominal pain, diarrhea, heartburn, nausea and vomiting.  Genitourinary:  Negative for dysuria, frequency, hematuria and urgency.  Musculoskeletal:  Negative for falls and myalgias.  Neurological:  Negative for dizziness.   Negative unless indicated in HPI.   Patient Active Problem List   Diagnosis Date Noted   Essential hypertension 03/16/2022   Claustrophobia 03/16/2022   Vitamin D  deficiency 03/16/2022   Gastroesophageal reflux disease 03/16/2022   HIV positive (HCC) 10/18/2021   Morbid obesity (HCC) 10/18/2021   Facial numbness 10/18/2021   Facial weakness 10/18/2021   Low testosterone  10/13/2021   Cervical lymphadenopathy 10/28/2018   Intractable nausea and vomiting 10/27/2018   Bell palsy 10/03/2018   PCP NOTES >>> 07/06/2015   Elevated LFTs 03/28/2014   Screening for malignant neoplasm of prostate 03/26/2014   Morbid obesity with BMI of 50.0-59.9, adult (HCC) 01/02/2014  Nonspecific abnormal electrocardiogram (ECG) (EKG) 01/10/2012   HYPERLIPIDEMIA 11/14/2010   Allergic rhinitis 11/14/2010   Diabetes mellitus, type II (HCC) 10/19/2010   ELEVATED BLOOD PRESSURE WITHOUT DIAGNOSIS OF  HYPERTENSION 10/13/2010   Past Medical History:  Diagnosis Date   Adenoma of left adrenal gland    incidental on CT abd 10/2018.   Allergy    Arthritis    Bell's palsy    L side.  Pain involved (?)--got better with gabapentin .   Diabetes mellitus without complication (HCC)    hx of , no meds   Erosive gastritis 10/2018   EGD   Esophageal stenosis 10/2018   dilation done   GERD (gastroesophageal reflux disease)    Heart murmur 1995   High risk sexual behavior    sex with men: started PrEP (descovy ) 08/15/19.   Hyperlipidemia    75), HDL 27, TG 105. LDL goal= <110, no per pt   Hypertension    Iron deficiency anemia    07/2024.  Hemoccults ordered.  Oral iron started.   Morbid obesity (HCC)    BMI>50   NASH (nonalcoholic steatohepatitis)    Seasonal allergies    RAD with RTIs only   Secondary male hypogonadism    Past Surgical History:  Procedure Laterality Date   COLONOSCOPY W/ POLYPECTOMY  10/27/2019   Polyp x 1->submucosal lipoma.  Recall 10 yrs.   ESOPHAGOGASTRODUODENOSCOPY  10/29/2018   2020.  Dysphagia 2025--> no stricture.  1 fundal polyp.  Otherwise normal.  Empiric dilatation done.  No help.   LASIK  2000   Bilaterally   TONSILLECTOMY     Social History   Tobacco Use   Smoking status: Never   Smokeless tobacco: Never  Vaping Use   Vaping status: Never Used  Substance Use Topics   Alcohol use: Not Currently    Alcohol/week: 0.0 standard drinks of alcohol   Drug use: No   Family History  Problem Relation Age of Onset   Hypertension Mother    Arthritis Mother    Lung cancer Maternal Grandmother    COPD Maternal Grandmother        uncle    Cancer Maternal Grandmother    Heart failure Maternal Grandfather    Transient ischemic attack Maternal Grandfather    Hearing loss Maternal Grandfather    Stroke Maternal Grandfather    Hypertension Other    Diabetes Other        MG aunt   COPD Maternal Aunt    Diabetes Maternal Aunt    Colon cancer Neg Hx     Prostate cancer Neg Hx    CAD Neg Hx    Esophageal cancer Neg Hx    Stomach cancer Neg Hx    Rectal cancer Neg Hx    Pancreatic cancer Neg Hx    Allergies  Allergen Reactions   Penicillins     Rash    Sulfonamide Derivatives     REACTION: rash Tongue swelling   Sulfa Antibiotics Swelling    Tongue swelling   Metformin  And Related Other (See Comments)    Hypoglyc+ GI s/e    Medications: Patient's Medications  New Prescriptions   No medications on file  Previous Medications   EMTRICITABINE-TENOFOVIR AF (DESCOVY ) 200-25 MG TABLET    Take 1 tablet by mouth daily.   HYDRALAZINE  (APRESOLINE ) 25 MG TABLET    TAKE 1 TABLET BY MOUTH THREE TIMES A DAY   HYDROCHLOROTHIAZIDE  (HYDRODIURIL ) 25 MG TABLET    Take 1 tablet (25  mg total) by mouth daily.   METOPROLOL  TARTRATE (LOPRESSOR ) 50 MG TABLET    Take 1 tablet (50 mg total) by mouth 2 (two) times daily.   PANTOPRAZOLE  (PROTONIX ) 40 MG TABLET    TAKE 1 TABLET BY MOUTH DAILY   TESTOSTERONE  CYPIONATE 200 MG/ML SOLN    200 mg IM every 2 weeks   TIRZEPATIDE  (MOUNJARO ) 5 MG/0.5ML PEN    Inject 5 mg into the skin once a week.  Modified Medications   No medications on file  Discontinued Medications   No medications on file    Physical Exam: There were no vitals filed for this visit. There is no height or weight on file to calculate BMI. BP Readings from Last 3 Encounters:  08/01/24 128/76  05/02/24 117/73  01/11/24 105/70   Wt Readings from Last 3 Encounters:  08/01/24 (!) 413 lb 3.2 oz (187.4 kg)  05/02/24 (!) 417 lb 9.6 oz (189.4 kg)  01/11/24 (!) 394 lb 12.8 oz (179.1 kg)    Physical Exam Constitutional:      Appearance: Normal appearance.  HENT:     Head: Normocephalic and atraumatic.     Right Ear: Tympanic membrane normal.     Left Ear: Tympanic membrane normal.     Nose: Congestion present.     Mouth/Throat:     Mouth: Mucous membranes are moist.     Comments: Unable to see back of the throat Rt sided slight facial  swelling, tenderness  Rt maxillary tenderness+ Eyes:     Pupils: Pupils are equal, round, and reactive to light.  Cardiovascular:     Rate and Rhythm: Normal rate and regular rhythm.     Pulses: Normal pulses.     Heart sounds: Normal heart sounds.  Pulmonary:     Effort: No respiratory distress.     Breath sounds: No stridor. No wheezing or rales.  Abdominal:     General: Bowel sounds are normal. There is no distension.     Palpations: Abdomen is soft.     Tenderness: There is no abdominal tenderness. There is no guarding.  Neurological:     Mental Status: He is alert. Mental status is at baseline.     Motor: No weakness.     Labs reviewed: Basic Metabolic Panel: Recent Labs    11/30/23 0925 05/02/24 1625 08/01/24 1004  NA 140 139 137  K 3.7 4.6 4.4  CL 97 102 97  CO2 35* 25 34*  GLUCOSE 117* 111* 107*  BUN 10 16 9   CREATININE 0.72 0.92 0.86  CALCIUM 9.3 8.8 8.7  TSH  --   --  1.81   Liver Function Tests: Recent Labs    11/30/23 0925 08/01/24 1004  AST 25 23  ALT 25 17  ALKPHOS 67 66  BILITOT 1.0 1.2  PROT 6.9 7.1  ALBUMIN 4.2 4.3   No results for input(s): LIPASE, AMYLASE in the last 8760 hours. No results for input(s): AMMONIA in the last 8760 hours. CBC: Recent Labs    08/01/24 1004  WBC 7.7  NEUTROABS 5.4  HGB 12.6*  HCT 40.1  MCV 73.8*  PLT 160.0   Lipid Panel: Recent Labs    11/30/23 0925 08/01/24 1004  CHOL 147 143  HDL 35.10* 34.30*  LDLCALC 88 85  TRIG 122.0 123.0  CHOLHDL 4 4   TSH: Recent Labs    08/01/24 1004  TSH 1.81   A1C: Lab Results  Component Value Date   HGBA1C 6.6 (A) 08/01/2024  HGBA1C 6.6 08/01/2024   HGBA1C 6.6 (A) 08/01/2024   HGBA1C 6.6 08/01/2024    Assessment & Plan Acute non-recurrent maxillary sinusitis Rt sided facial swelling with Rt maxillary tenderness Will send doxycycline  Instructed to use flonase, saline nasal mist Orders:   doxycycline  (VIBRA -TABS) 100 MG tablet; Take 1 tablet  (100 mg total) by mouth 2 (two) times daily.  Acute cough Lungs clear  Flu/ covid neg Will send tessalon Orders:   POCT Influenza A/B   POC COVID-19 BinaxNow   benzonatate (TESSALON) 200 MG capsule; Take 1 capsule (200 mg total) by mouth 2 (two) times daily as needed for cough.  Toothache Pt reports that he has appt with dentist this morning    Primary hypertension Previous bp readings at goal He checks bp every other day  Repeat bp at goal  Cont with hydrochlorothiazide , lopressor , hydralazine  Avoid salty foods Exercise regularly      No follow-ups on file.:  follow up with PCP  Jackalyn Blazing

## 2024-08-15 ENCOUNTER — Other Ambulatory Visit

## 2024-08-15 DIAGNOSIS — D509 Iron deficiency anemia, unspecified: Secondary | ICD-10-CM

## 2024-08-15 LAB — HEMOCCULT SLIDES (X 3 CARDS)
Fecal Occult Blood: NEGATIVE
OCCULT 1: NEGATIVE
OCCULT 2: NEGATIVE
OCCULT 3: NEGATIVE
OCCULT 4: NEGATIVE
OCCULT 5: NEGATIVE

## 2024-08-17 ENCOUNTER — Encounter: Payer: Self-pay | Admitting: Family Medicine

## 2024-08-17 ENCOUNTER — Ambulatory Visit: Payer: Self-pay | Admitting: Family Medicine

## 2024-09-08 ENCOUNTER — Other Ambulatory Visit: Payer: Self-pay | Admitting: Family Medicine

## 2024-10-02 ENCOUNTER — Ambulatory Visit: Admitting: Neurology

## 2024-10-02 ENCOUNTER — Encounter: Payer: Self-pay | Admitting: Neurology

## 2024-10-02 VITALS — BP 158/98 | HR 78 | Ht 71.0 in | Wt >= 6400 oz

## 2024-10-02 DIAGNOSIS — G4719 Other hypersomnia: Secondary | ICD-10-CM | POA: Diagnosis not present

## 2024-10-02 DIAGNOSIS — Z6841 Body Mass Index (BMI) 40.0 and over, adult: Secondary | ICD-10-CM | POA: Diagnosis not present

## 2024-10-02 DIAGNOSIS — R351 Nocturia: Secondary | ICD-10-CM

## 2024-10-02 DIAGNOSIS — Z9189 Other specified personal risk factors, not elsewhere classified: Secondary | ICD-10-CM | POA: Diagnosis not present

## 2024-10-02 DIAGNOSIS — R0683 Snoring: Secondary | ICD-10-CM | POA: Diagnosis not present

## 2024-10-02 DIAGNOSIS — R0681 Apnea, not elsewhere classified: Secondary | ICD-10-CM | POA: Diagnosis not present

## 2024-10-02 NOTE — Progress Notes (Signed)
 Subjective:    Patient ID: Dwayne Lewis is a 54 y.o. male.  HPI    True Mar, MD, PhD [facility] [address], [address] [address] [place], [place] [address]  Dear Dr. Candise,   I saw your patient, Dwayne Lewis, upon your kind request in my sleep clinic today for initial consultation of his sleep disorder, in particular, concern for underlying obstructive sleep apnea.  The patient is accompanied by his mother today.  As you know, Mr. Cuevas is a 54 year old male with an underlying medical history of left-sided Bell's palsy (for which he saw Dr. Onita in 2023), allergy, arthritis, gastritis, left adrenal gland adenoma, diabetes, steatohepatitis, iron deficiency anemia, hypertension, hypogonadism, and morbid obesity with a BMI of over 50, who reports snoring and excessive daytime somnolence as well as witnessed breathing pauses per mom's description.  His Epworth sleepiness score is 9 out of 24, fatigue severity score is 27 out of 63.  His blood pressure is elevated today.  He reports that he took his metoprolol  today.  He has no symptoms from his elevated blood pressure such as chest pain or shortness of breath or headache or blurry vision.  He lives with his mother and is her caretaker.  They have no pets in the household.  He is single and has no children.  Bedtime is generally around 7:30 PM, rise time around 6:30 AM when he works from home, otherwise 5:15 AM if he has to work in the office which is twice a week.  He works in a call center.  He drinks no caffeine  on a daily basis.  He does not drink any alcohol.  He is a non-smoker.   He is not aware of any family history of sleep apnea.  He is working on weight loss.  He has had weight fluctuation.   He denies recurrent morning headaches but has nocturia up to 4 times a night on average.  He had a tonsillectomy as a child.  His Past Medical History Is Significant For: Past Medical History:  Diagnosis Date    Adenoma of left adrenal gland    incidental on CT abd 10/2018.   Allergy    Arthritis    Bell's palsy    L side.  Pain involved (?)--got better with gabapentin .   Diabetes mellitus without complication (HCC)    hx of , no meds   Erosive gastritis 10/2018   EGD   Esophageal stenosis 10/2018   dilation done   GERD (gastroesophageal reflux disease)    Heart murmur 1995   High risk sexual behavior    sex with men: started PrEP (descovy ) 08/15/19.   Hyperlipidemia    75), HDL 27, TG 105. LDL goal= <110, no per pt   Hypertension    Iron deficiency anemia    07/2024.  Hemoccults neg x 3.  Oral iron started.   Morbid obesity (HCC)    BMI>50   NASH (nonalcoholic steatohepatitis)    Seasonal allergies    RAD with RTIs only   Secondary male hypogonadism     His Past Surgical History Is Significant For: Past Surgical History:  Procedure Laterality Date   COLONOSCOPY W/ POLYPECTOMY  10/27/2019   Polyp x 1->submucosal lipoma.  Recall 10 yrs.   ESOPHAGOGASTRODUODENOSCOPY  10/29/2018   2020.  Dysphagia 2025--> no stricture.  1 fundal polyp.  Otherwise normal.  Empiric dilatation done.  No help.   LASIK  2000   Bilaterally  TONSILLECTOMY      His Family History Is Significant For: Family History  Problem Relation Age of Onset   Hypertension Mother    Arthritis Mother    COPD Maternal Aunt    Diabetes Maternal Aunt    Lung cancer Maternal Grandmother    COPD Maternal Grandmother        uncle    Cancer Maternal Grandmother    Heart failure Maternal Grandfather    Transient ischemic attack Maternal Grandfather    Hearing loss Maternal Grandfather    Stroke Maternal Grandfather    Hypertension Other    Diabetes Other        MG aunt   Colon cancer Neg Hx    Prostate cancer Neg Hx    CAD Neg Hx    Esophageal cancer Neg Hx    Stomach cancer Neg Hx    Rectal cancer Neg Hx    Pancreatic cancer Neg Hx    Sleep apnea Neg Hx     His Social History Is Significant For: Social  History   Socioeconomic History   Marital status: Single    Spouse name: Not on file   Number of children: 0   Years of education: Not on file   Highest education level: 12th grade  Occupational History   Occupation: works for a [employer] time   Tobacco Use   Smoking status: Never   Smokeless tobacco: Never  Vaping Use   Vaping status: Never Used  Substance and Sexual Activity   Alcohol use: Not Currently    Alcohol/week: 0.0 standard drinks of alcohol   Drug use: No   Sexual activity: Yes    Partners: Male  Other Topics Concern   Not on file  Social History Narrative   Single, no children.   Orig from [place].   Occup: works part time for [employer].   No tobacco.   No alcohol.      Lives w/ mother to help her out (mother is [name])   Social Drivers of Health   Tobacco Use: Low Risk (10/02/2024)   Patient History    Smoking Tobacco Use: Never    Smokeless Tobacco Use: Never    Passive Exposure: Not on file  Financial Resource Strain: Low Risk (08/30/2023)   Overall Financial Resource Strain (CARDIA)    Difficulty of Paying Living Expenses: Not hard at all  Food Insecurity: No Food Insecurity (08/30/2023)   Hunger Vital Sign    Worried About Running Out of Food in the Last Year: Never true    Ran Out of Food in the Last Year: Never true  Transportation Needs: No Transportation Needs (08/30/2023)   PRAPARE - Administrator, Civil Service (Medical): No    Lack of Transportation (Non-Medical): No  Physical Activity: Unknown (08/30/2023)   Exercise Vital Sign    Days of Exercise per Week: 0 days    Minutes of Exercise per Session: Not on file  Stress: No Stress Concern Present (03/21/2024)   Received from [facility] - Occupational Stress Questionnaire    Feeling of Stress : Only a little  Social Connections: Unknown (08/30/2023)   Social Connection and Isolation Panel    Frequency  of Communication with Friends and Family: Once a week    Frequency of Social Gatherings with Friends and Family: Once a week    Attends Religious Services: Patient declined    Active Member of  Clubs or Organizations: No    Attends Engineer, Structural: Not on file    Marital Status: Patient declined  Recent Concern: Social Connections - Socially Isolated (07/17/2023)   Social Connection and Isolation Panel    Frequency of Communication with Friends and Family: Once a week    Frequency of Social Gatherings with Friends and Family: Once a week    Attends Religious Services: Never    Database Administrator or Organizations: No    Attends Engineer, Structural: Not on file    Marital Status: Never married  Depression (PHQ2-9): Low Risk (11/30/2023)   Depression (PHQ2-9)    PHQ-2 Score: 4  Alcohol Screen: Not on file  Housing: Low Risk (08/30/2023)   Housing Stability Vital Sign    Unable to Pay for Housing in the Last Year: No    Number of Times Moved in the Last Year: 0    Homeless in the Last Year: No  Utilities: Not on file  Health Literacy: Not on file    His Allergies Are:  Allergies[1]:   His Current Medications Are:  Outpatient Encounter Medications as of 10/02/2024  Medication Sig   emtricitabine-tenofovir AF (DESCOVY ) 200-25 MG tablet Take 1 tablet by mouth daily.   metoprolol  tartrate (LOPRESSOR ) 50 MG tablet Take 1 tablet (50 mg total) by mouth 2 (two) times daily.   MOUNJARO  5 MG/0.5ML Pen INJECT FIVE MG INTO THE SKIN ONCE A WEEK   pantoprazole  (PROTONIX ) 40 MG tablet TAKE 1 TABLET BY MOUTH DAILY   Testosterone  Cypionate 200 MG/ML SOLN 200 mg IM every 2 weeks   benzonatate  (TESSALON ) 200 MG capsule Take 1 capsule (200 mg total) by mouth 2 (two) times daily as needed for cough.   doxycycline  (VIBRA -TABS) 100 MG tablet Take 1 tablet (100 mg total) by mouth 2 (two) times daily.   hydrALAZINE  (APRESOLINE ) 25 MG tablet TAKE 1 TABLET BY MOUTH THREE TIMES A DAY    hydrochlorothiazide  (HYDRODIURIL ) 25 MG tablet Take 1 tablet (25 mg total) by mouth daily.   Facility-Administered Encounter Medications as of 10/02/2024  Medication   testosterone  cypionate (DEPOTESTOSTERONE CYPIONATE) injection 200 mg  :   Review of Systems:  Out of a complete 14 point review of systems, all are reviewed and negative with the exception of these symptoms as listed below:  Review of Systems  Objective:  Neurological Exam  Physical Exam Physical Examination:   Vitals:   10/02/24 1520  BP: (!) 158/98  Pulse: 78    General Examination: The patient is a very pleasant 54 y.o. male in no acute distress. He appears well-developed and well-nourished and well groomed.   HEENT: Normocephalic, atraumatic, pupils are equal, round and reactive to light, extraocular tracking is good without limitation to gaze excursion or nystagmus noted. No photophobia.  Corrective eye glasses in place. Hearing is grossly intact.  Face is mildly asymmetric with mild left facial weakness noted.  Speech is clear without dysarthria. There is no hypophonia. There is no lip, neck/head, jaw or voice tremor. Neck is supple with full range of passive and active motion. There are no carotid bruits on auscultation.  Airway/Oropharynx exam reveals: mild mouth dryness, adequate dental hygiene and marked airway crowding, due to smaller airway entry, smaller mouth opening noted, Mallampati class IV, uvula not fully visualized.  Tongue protrudes centrally.  Chest: Clear to auscultation without wheezing, rhonchi or crackles noted.  Heart: S1+S2+0, regular and normal without murmurs, rubs or gallops noted.  Abdomen: Soft, non-tender and non-distended.  Extremities: There is 1+ pitting edema in the distal lower extremities bilaterally.   Skin: Warm and dry without trophic changes noted.   Musculoskeletal: exam reveals no obvious joint deformities.   Neurologically:  Mental status: The patient is  awake, alert and oriented in all 4 spheres. His immediate and remote memory, attention, language skills and fund of knowledge are appropriate. There is no evidence of aphasia, agnosia, apraxia or anomia. Speech is clear with normal prosody and enunciation. Thought process is linear. Mood is normal and affect is normal.  Cranial nerves II - XII are as described above under HEENT exam.  Motor exam: Normal bulk, moving all 4 extremities without any obvious restriction, no obvious action or resting tremor.  Fine motor skills and coordination: Intact grossly.  Cerebellar testing: No dysmetria or intention tremor. There is no truncal or gait ataxia.  Sensory exam: intact to light touch in the upper and lower extremities.  Gait, station and balance: He stands easily. No veering to one side is noted. No leaning to one side is noted. Posture is age-appropriate and stance is narrow based. Gait shows normal stride length and normal pace. No problems turning are noted.   Assessment and Plan:  In summary, RAJIV PARLATO is a 54 year old male with an underlying medical history of left-sided Bell's palsy (for which he saw Dr. Onita in 2023), allergy, arthritis, gastritis, left adrenal gland adenoma, diabetes, steatohepatitis, iron deficiency anemia, hypertension, hypogonadism, and morbid obesity with a BMI of over 50, whose history and physical exam are concerning for sleep disordered breathing, particularly obstructive sleep apnea (OSA). While a laboratory attended sleep study is typically considered gold standard for evaluation of sleep disordered breathing, the patient would prefer a home sleep test especially as he does not typically leave his mom alone at home.   I had a long chat with the patient and his mother about my findings and the diagnosis of sleep apnea, particularly OSA, its prognosis and treatment options. We talked about medical/conservative treatments, surgical interventions and non-pharmacological  approaches for symptom control. I explained, in particular, the risks and ramifications of untreated moderate to severe OSA, especially with respect to developing cardiovascular disease down the road, including congestive heart failure (CHF), difficult to treat hypertension, cardiac arrhythmias (particularly A-fib), neurovascular complications including TIA, stroke and dementia. Even type 2 diabetes has, in part, been linked to untreated OSA. Symptoms of untreated OSA may include (but may not be limited to) daytime sleepiness, nocturia (i.e. frequent nighttime urination), memory problems, mood irritability and suboptimally controlled or worsening mood disorder such as depression and/or anxiety, lack of energy, lack of motivation, physical discomfort, as well as recurrent headaches, especially morning or nocturnal headaches. We talked about the importance of maintaining a healthy lifestyle and striving for healthy weight.  I recommended a sleep study at this time. I outlined the differences between a laboratory attended sleep study which is considered more comprehensive and accurate over the option of a home sleep test (HST); the latter may lead to underestimation of sleep disordered breathing in some instances and does not help with diagnosing upper airway resistance syndrome and is not accurate enough to diagnose primary central sleep apnea typically. I outlined possible surgical and non-surgical treatment options of OSA, including the use of a positive airway pressure (PAP) device (i.e. CPAP, AutoPAP/APAP or BiPAP in certain circumstances), a custom-made dental device (aka oral appliance, which would require a referral to a specialist dentist or orthodontist  typically, and is generally speaking not considered for patients with full dentures or edentulous state), upper airway surgical options, such as traditional UPPP (which is not considered a first-line treatment) or the Inspire device (hypoglossal nerve  stimulator, which would involve a referral for consultation with an ENT surgeon, after careful selection, following inclusion criteria - also not first-line treatment). I explained the PAP treatment option to the patient in detail, as this is generally considered first-line treatment.  The patient indicated that he would be reluctant but willing to consider PAP therapy if the need arises.     We will pick up our discussion about the next steps and treatment options after testing.  We will keep him posted as to the test results by phone call and/or MyChart messaging where possible.  We will plan to follow-up in sleep clinic accordingly as well.  I answered all their questions today and the patient and his mother were in agreement.   I encouraged him to call with any interim questions, concerns, problems or updates or email us  through MyChart.  Generally speaking, sleep test authorizations may take up to 2 weeks, sometimes less, sometimes longer, the patient is encouraged to get in touch with us  if they do not hear back from the sleep lab staff directly within the next 2 weeks.  Thank you very much for allowing me to participate in the care of this nice patient. If I can be of any further assistance to you please do not hesitate to call me at [phone].  Sincerely,   True Mar, MD, PhD     [1]  Allergies Allergen Reactions   Penicillins     Rash    Sulfonamide Derivatives     REACTION: rash Tongue swelling   Sulfa Antibiotics Swelling    Tongue swelling   Metformin  And Related Other (See Comments)    Hypoglyc+ GI s/e

## 2024-10-03 ENCOUNTER — Encounter: Payer: Self-pay | Admitting: Family Medicine

## 2024-10-03 ENCOUNTER — Ambulatory Visit (INDEPENDENT_AMBULATORY_CARE_PROVIDER_SITE_OTHER): Admitting: Family Medicine

## 2024-10-03 VITALS — BP 119/75 | HR 76 | Temp 98.4°F | Ht 71.0 in | Wt >= 6400 oz

## 2024-10-03 DIAGNOSIS — L304 Erythema intertrigo: Secondary | ICD-10-CM | POA: Diagnosis not present

## 2024-10-03 DIAGNOSIS — I10 Essential (primary) hypertension: Secondary | ICD-10-CM

## 2024-10-03 MED ORDER — HYDRALAZINE HCL 25 MG PO TABS: 25.0000 mg | ORAL_TABLET | Freq: Three times a day (TID) | ORAL | 3 refills | Status: AC

## 2024-10-03 MED ORDER — CLOTRIMAZOLE-BETAMETHASONE 1-0.05 % EX CREA: 1.0000 | TOPICAL_CREAM | Freq: Every day | CUTANEOUS | 1 refills | Status: AC

## 2024-10-03 NOTE — Progress Notes (Signed)
 OFFICE VISIT  10/03/2024  CC:  Chief Complaint  Patient presents with   Medical Management of Chronic Issues    Patient is a 54 y.o. male who presents for elevated blood pressure.  HPI:  He went to the emergency department on 09/28/2024 for headache and elevated blood pressure at home, to 193/118. Blood pressure in the emergency department was 140s-160s , heart rate 77.  No new meds rx'd.    Occasionally he will have an increased period of stress and he thinks this may have led to blood pressure increase and headache. Since the emergency department his blood pressures have been in the 130-150 range and he has had no headaches.  He was taking metoprolol  50 mg only once a day.  He had not been taking HCTZ or hydralazine , which are both on his med list as daily medicines. Is not clear why he has not been on these.  Also, lately he has an itchy/irritated rash in the right groin crease.  Review of systems: No dizziness, no chest pain, no focal weakness, no palpitations, no shortness of breath.  Past Medical History:  Diagnosis Date   Adenoma of left adrenal gland    incidental on CT abd 10/2018.   Allergy    Arthritis    Bell's palsy    L side.  Pain involved (?)--got better with gabapentin .   Diabetes mellitus without complication (HCC)    hx of , no meds   Erosive gastritis 10/2018   EGD   Esophageal stenosis 10/2018   dilation done   GERD (gastroesophageal reflux disease)    Heart murmur 1995   High risk sexual behavior    sex with men: started PrEP (descovy ) 08/15/19.   Hyperlipidemia    75), HDL 27, TG 105. LDL goal= <110, no per pt   Hypertension    Iron deficiency anemia    07/2024.  Hemoccults neg x 3.  Oral iron started.   Morbid obesity (HCC)    BMI>50   NASH (nonalcoholic steatohepatitis)    Seasonal allergies    RAD with RTIs only   Secondary male hypogonadism     Past Surgical History:  Procedure Laterality Date   COLONOSCOPY W/ POLYPECTOMY  10/27/2019    Polyp x 1->submucosal lipoma.  Recall 10 yrs.   ESOPHAGOGASTRODUODENOSCOPY  10/29/2018   2020.  Dysphagia 2025--> no stricture.  1 fundal polyp.  Otherwise normal.  Empiric dilatation done.  No help.   LASIK  2000   Bilaterally   TONSILLECTOMY      Outpatient Medications Prior to Visit  Medication Sig Dispense Refill   emtricitabine-tenofovir AF (DESCOVY ) 200-25 MG tablet Take 1 tablet by mouth daily. 90 tablet 1   metoprolol  tartrate (LOPRESSOR ) 50 MG tablet Take 1 tablet (50 mg total) by mouth 2 (two) times daily. 180 tablet 3   MOUNJARO  5 MG/0.5ML Pen INJECT FIVE MG INTO THE SKIN ONCE A WEEK 2 mL 1   pantoprazole  (PROTONIX ) 40 MG tablet TAKE 1 TABLET BY MOUTH DAILY 180 tablet 3   benzonatate  (TESSALON ) 200 MG capsule Take 1 capsule (200 mg total) by mouth 2 (two) times daily as needed for cough. 20 capsule 0   doxycycline  (VIBRA -TABS) 100 MG tablet Take 1 tablet (100 mg total) by mouth 2 (two) times daily. 10 tablet 0   hydrALAZINE  (APRESOLINE ) 25 MG tablet TAKE 1 TABLET BY MOUTH THREE TIMES A DAY 90 tablet 5   hydrochlorothiazide  (HYDRODIURIL ) 25 MG tablet Take 1 tablet (25 mg total)  by mouth daily. 15 tablet 0   Testosterone  Cypionate 200 MG/ML SOLN 200 mg IM every 2 weeks 6 mL 1   Facility-Administered Medications Prior to Visit  Medication Dose Route Frequency Provider Last Rate Last Admin   testosterone  cypionate (DEPOTESTOSTERONE CYPIONATE) injection 200 mg  200 mg Intramuscular Q14 Days Nyela Cortinas, Aleene DEL, MD   200 mg at 07/25/24 0859    Allergies[1]  Review of Systems  As per HPI  PE:    10/03/2024    9:24 AM 10/02/2024    3:20 PM 08/11/2024    8:48 AM  Vitals with BMI  Height 5' 11 5' 11   Weight 403 lbs 13 oz 407 lbs 10 oz 410 lbs  BMI 56.34 56.87 57.21  Systolic 119 158 861  Diastolic 75 98 79  Pulse 76 78 100     Physical Exam  Gen: Alert, well appearing.  Patient is oriented to person, place, time, and situation. AFFECT: pleasant, lucid thought and  speech. CV: RRR, no m/r/g.   LUNGS: CTA bilat, nonlabored resps, good aeration in all lung fields. No further exam today  LABS:  Last metabolic panel Lab Results  Component Value Date   GLUCOSE 107 (H) 08/01/2024   NA 137 08/01/2024   K 4.4 08/01/2024   CL 97 08/01/2024   CO2 34 (H) 08/01/2024   BUN 9 08/01/2024   CREATININE 0.86 08/01/2024   GFR 97.32 08/01/2024   CALCIUM 8.7 08/01/2024   PROT 7.1 08/01/2024   ALBUMIN 4.3 08/01/2024   BILITOT 1.2 08/01/2024   ALKPHOS 66 08/01/2024   AST 23 08/01/2024   ALT 17 08/01/2024   ANIONGAP 7 08/25/2022   IMPRESSION AND PLAN:  #1 uncontrolled hypertension. There seems to have been some miscommunication in the past about his correct blood pressure medication regimen. Since the emergency department he has started taking the Lopressor  correctly--> 50 mg twice daily. We will restart hydralazine  25 mg 3 times daily.  It is okay for him to take an extra 25 mg tab as needed for blood pressure greater than 160/90. If he has to do this repetitively then he will give me a call or come in.  #2 intertrigo, right groin crease. Lotrisone cream prescribed today, apply twice daily.  Keep the area cool and dry.  An After Visit Summary was printed and given to the patient.  FOLLOW UP: Return for keep f/u appt 2/20.  Signed:  Gerlene Hockey, MD           10/03/2024     [1]  Allergies Allergen Reactions   Penicillins     Rash    Sulfonamide Derivatives     REACTION: rash Tongue swelling   Sulfa Antibiotics Swelling    Tongue swelling   Metformin  And Related Other (See Comments)    Hypoglyc+ GI s/e

## 2024-10-31 ENCOUNTER — Ambulatory Visit: Admitting: Family Medicine
